# Patient Record
Sex: Female | Born: 1935 | Race: White | Hispanic: No | Smoking: Never smoker
Health system: Southern US, Community
[De-identification: ages and names within clinical notes are randomized; demographics above are authoritative.]

## PROBLEM LIST (undated history)

## (undated) DIAGNOSIS — I2510 Atherosclerotic heart disease of native coronary artery without angina pectoris: Secondary | ICD-10-CM

## (undated) DIAGNOSIS — I38 Endocarditis, valve unspecified: Secondary | ICD-10-CM

## (undated) DIAGNOSIS — K219 Gastro-esophageal reflux disease without esophagitis: Secondary | ICD-10-CM

## (undated) DIAGNOSIS — Z7901 Long term (current) use of anticoagulants: Secondary | ICD-10-CM

## (undated) DIAGNOSIS — M1990 Unspecified osteoarthritis, unspecified site: Secondary | ICD-10-CM

## (undated) DIAGNOSIS — I4891 Unspecified atrial fibrillation: Secondary | ICD-10-CM

## (undated) DIAGNOSIS — I639 Cerebral infarction, unspecified: Secondary | ICD-10-CM

## (undated) DIAGNOSIS — I495 Sick sinus syndrome: Secondary | ICD-10-CM

## (undated) DIAGNOSIS — Z950 Presence of cardiac pacemaker: Secondary | ICD-10-CM

## (undated) DIAGNOSIS — F0390 Unspecified dementia without behavioral disturbance: Secondary | ICD-10-CM

## (undated) DIAGNOSIS — N189 Chronic kidney disease, unspecified: Secondary | ICD-10-CM

## (undated) DIAGNOSIS — K8020 Calculus of gallbladder without cholecystitis without obstruction: Secondary | ICD-10-CM

## (undated) DIAGNOSIS — J111 Influenza due to unidentified influenza virus with other respiratory manifestations: Secondary | ICD-10-CM

## (undated) DIAGNOSIS — D649 Anemia, unspecified: Secondary | ICD-10-CM

## (undated) DIAGNOSIS — I5032 Chronic diastolic (congestive) heart failure: Secondary | ICD-10-CM

## (undated) DIAGNOSIS — I10 Essential (primary) hypertension: Secondary | ICD-10-CM

## (undated) HISTORY — DX: Unspecified atrial fibrillation: I48.91

## (undated) HISTORY — DX: Presence of cardiac pacemaker: Z95.0

## (undated) HISTORY — DX: Sick sinus syndrome: I49.5

## (undated) HISTORY — DX: Unspecified osteoarthritis, unspecified site: M19.90

## (undated) HISTORY — PX: CATARACT EXTRACTION, BILATERAL: SHX1313

## (undated) HISTORY — DX: Essential (primary) hypertension: I10

## (undated) HISTORY — DX: Chronic kidney disease, unspecified: N18.9

## (undated) HISTORY — DX: Calculus of gallbladder without cholecystitis without obstruction: K80.20

## (undated) HISTORY — DX: Atherosclerotic heart disease of native coronary artery without angina pectoris: I25.10

## (undated) HISTORY — DX: Long term (current) use of anticoagulants: Z79.01

## (undated) HISTORY — DX: Anemia, unspecified: D64.9

## (undated) HISTORY — DX: Endocarditis, valve unspecified: I38

## (undated) HISTORY — DX: Cerebral infarction, unspecified: I63.9

## (undated) HISTORY — DX: Unspecified dementia, unspecified severity, without behavioral disturbance, psychotic disturbance, mood disturbance, and anxiety: F03.90

## (undated) HISTORY — PX: ABDOMINAL HYSTERECTOMY: SUR658

## (undated) HISTORY — DX: Chronic diastolic (congestive) heart failure: I50.32

## (undated) HISTORY — PX: HEMORRHOID SURGERY: SHX153

## (undated) HISTORY — PX: CHOLECYSTECTOMY: SHX55

## (undated) HISTORY — PX: TOTAL KNEE ARTHROPLASTY: SHX125

## (undated) HISTORY — PX: APPENDECTOMY: SHX54

---

## 1998-10-16 ENCOUNTER — Other Ambulatory Visit: Admission: RE | Admit: 1998-10-16 | Discharge: 1998-10-16 | Payer: Self-pay | Admitting: *Deleted

## 1999-09-11 ENCOUNTER — Other Ambulatory Visit: Admission: RE | Admit: 1999-09-11 | Discharge: 1999-09-11 | Payer: Self-pay | Admitting: *Deleted

## 2000-10-02 ENCOUNTER — Other Ambulatory Visit: Admission: RE | Admit: 2000-10-02 | Discharge: 2000-10-02 | Payer: Self-pay | Admitting: *Deleted

## 2001-01-01 ENCOUNTER — Encounter (INDEPENDENT_AMBULATORY_CARE_PROVIDER_SITE_OTHER): Payer: Self-pay | Admitting: Specialist

## 2001-01-01 ENCOUNTER — Ambulatory Visit (HOSPITAL_COMMUNITY): Admission: RE | Admit: 2001-01-01 | Discharge: 2001-01-01 | Payer: Self-pay | Admitting: Internal Medicine

## 2001-09-20 ENCOUNTER — Emergency Department (HOSPITAL_COMMUNITY): Admission: EM | Admit: 2001-09-20 | Discharge: 2001-09-20 | Payer: Self-pay | Admitting: Emergency Medicine

## 2004-09-20 ENCOUNTER — Encounter: Admission: RE | Admit: 2004-09-20 | Discharge: 2004-09-20 | Payer: Self-pay | Admitting: Internal Medicine

## 2007-04-15 ENCOUNTER — Encounter: Admission: RE | Admit: 2007-04-15 | Discharge: 2007-04-15 | Payer: Self-pay | Admitting: Internal Medicine

## 2008-06-11 ENCOUNTER — Inpatient Hospital Stay (HOSPITAL_COMMUNITY): Admission: EM | Admit: 2008-06-11 | Discharge: 2008-06-14 | Payer: Self-pay | Admitting: Emergency Medicine

## 2008-06-13 ENCOUNTER — Encounter (INDEPENDENT_AMBULATORY_CARE_PROVIDER_SITE_OTHER): Payer: Self-pay | Admitting: Internal Medicine

## 2008-06-13 ENCOUNTER — Ambulatory Visit: Payer: Self-pay | Admitting: *Deleted

## 2008-11-19 ENCOUNTER — Inpatient Hospital Stay (HOSPITAL_COMMUNITY): Admission: EM | Admit: 2008-11-19 | Discharge: 2008-11-27 | Payer: Self-pay | Admitting: Emergency Medicine

## 2008-11-24 ENCOUNTER — Ambulatory Visit: Payer: Self-pay | Admitting: Internal Medicine

## 2009-01-16 ENCOUNTER — Encounter (INDEPENDENT_AMBULATORY_CARE_PROVIDER_SITE_OTHER): Payer: Self-pay | Admitting: Surgery

## 2009-01-16 ENCOUNTER — Ambulatory Visit (HOSPITAL_COMMUNITY): Admission: RE | Admit: 2009-01-16 | Discharge: 2009-01-17 | Payer: Self-pay | Admitting: Surgery

## 2010-06-13 ENCOUNTER — Ambulatory Visit: Payer: Self-pay | Admitting: Cardiology

## 2010-06-25 ENCOUNTER — Ambulatory Visit: Payer: Self-pay | Admitting: Cardiology

## 2010-07-03 ENCOUNTER — Encounter: Payer: Self-pay | Admitting: Cardiology

## 2010-07-03 ENCOUNTER — Ambulatory Visit: Payer: Self-pay

## 2010-07-03 ENCOUNTER — Ambulatory Visit (HOSPITAL_COMMUNITY)
Admission: RE | Admit: 2010-07-03 | Discharge: 2010-07-03 | Payer: Self-pay | Source: Home / Self Care | Attending: Cardiology | Admitting: Cardiology

## 2010-07-04 ENCOUNTER — Telehealth (INDEPENDENT_AMBULATORY_CARE_PROVIDER_SITE_OTHER): Payer: Self-pay | Admitting: *Deleted

## 2010-07-05 ENCOUNTER — Ambulatory Visit: Payer: Self-pay | Admitting: Cardiology

## 2010-08-07 ENCOUNTER — Ambulatory Visit: Payer: Self-pay | Admitting: Cardiology

## 2010-08-09 NOTE — Progress Notes (Signed)
Summary: Records Request  Faxed Echo to [facility] at [facility] ([phone]). Debby Freiberg  July 04, 2010 11:43 AM

## 2010-10-14 LAB — BASIC METABOLIC PANEL
Calcium: 11 mg/dL — ABNORMAL HIGH (ref 8.4–10.5)
Chloride: 100 mEq/L (ref 96–112)
GFR calc non Af Amer: 27 mL/min — ABNORMAL LOW (ref >60)
Glucose, Bld: 94 mg/dL (ref 70–99)

## 2010-10-14 LAB — COMPREHENSIVE METABOLIC PANEL
ALT: 111 U/L — ABNORMAL HIGH (ref 0–35)
Albumin: 2.8 g/dL — ABNORMAL LOW (ref 3.5–5.2)
BUN: 24 mg/dL — ABNORMAL HIGH (ref 6–23)
CO2: 28 mEq/L (ref 19–32)
Calcium: 8.9 mg/dL (ref 8.4–10.5)
GFR calc non Af Amer: 29 mL/min — ABNORMAL LOW (ref >60)
Glucose, Bld: 123 mg/dL — ABNORMAL HIGH (ref 70–99)
Total Protein: 5.5 g/dL — ABNORMAL LOW (ref 6.0–8.3)

## 2010-10-14 LAB — CBC
MCHC: 33.2 g/dL (ref 30.0–36.0)
MCV: 87.2 fL (ref 78.0–100.0)
RBC: 3.29 MIL/uL — ABNORMAL LOW (ref 3.87–5.11)

## 2010-10-14 LAB — HEMOGLOBIN AND HEMATOCRIT, BLOOD: Hemoglobin: 11.7 g/dL — ABNORMAL LOW (ref 12.0–15.0)

## 2010-10-16 ENCOUNTER — Encounter: Payer: Self-pay | Admitting: Cardiology

## 2010-10-16 ENCOUNTER — Telehealth: Payer: Self-pay | Admitting: Cardiology

## 2010-10-16 DIAGNOSIS — F0390 Unspecified dementia without behavioral disturbance: Secondary | ICD-10-CM

## 2010-10-16 DIAGNOSIS — I639 Cerebral infarction, unspecified: Secondary | ICD-10-CM

## 2010-10-16 DIAGNOSIS — N189 Chronic kidney disease, unspecified: Secondary | ICD-10-CM

## 2010-10-16 DIAGNOSIS — I10 Essential (primary) hypertension: Secondary | ICD-10-CM | POA: Insufficient documentation

## 2010-10-16 DIAGNOSIS — M1990 Unspecified osteoarthritis, unspecified site: Secondary | ICD-10-CM

## 2010-10-16 DIAGNOSIS — K8020 Calculus of gallbladder without cholecystitis without obstruction: Secondary | ICD-10-CM

## 2010-10-16 DIAGNOSIS — I4891 Unspecified atrial fibrillation: Secondary | ICD-10-CM

## 2010-10-16 LAB — CBC
HCT: 30.1 % — ABNORMAL LOW (ref 36.0–46.0)
HCT: 30.3 % — ABNORMAL LOW (ref 36.0–46.0)
HCT: 30.3 % — ABNORMAL LOW (ref 36.0–46.0)
HCT: 31 % — ABNORMAL LOW (ref 36.0–46.0)
HCT: 31 % — ABNORMAL LOW (ref 36.0–46.0)
Hemoglobin: 10.1 g/dL — ABNORMAL LOW (ref 12.0–15.0)
Hemoglobin: 10.1 g/dL — ABNORMAL LOW (ref 12.0–15.0)
Hemoglobin: 10.2 g/dL — ABNORMAL LOW (ref 12.0–15.0)
Hemoglobin: 10.4 g/dL — ABNORMAL LOW (ref 12.0–15.0)
MCHC: 32.7 g/dL (ref 30.0–36.0)
MCHC: 33 g/dL (ref 30.0–36.0)
MCHC: 33.1 g/dL (ref 30.0–36.0)
MCHC: 33.5 g/dL (ref 30.0–36.0)
MCHC: 33.7 g/dL (ref 30.0–36.0)
MCHC: 33.8 g/dL (ref 30.0–36.0)
MCHC: 33.3 g/dL (ref 30.0–36.0)
MCV: 86.6 fL (ref 78.0–100.0)
MCV: 86.7 fL (ref 78.0–100.0)
MCV: 87.1 fL (ref 78.0–100.0)
MCV: 88.7 fL (ref 78.0–100.0)
MCV: 87.1 fL (ref 78.0–100.0)
Platelets: 184 10*3/uL (ref 150–400)
Platelets: 199 10*3/uL (ref 150–400)
Platelets: 213 10*3/uL (ref 150–400)
Platelets: 228 10*3/uL (ref 150–400)
Platelets: 269 10*3/uL (ref 150–400)
Platelets: 321 10*3/uL (ref 150–400)
Platelets: 352 10*3/uL (ref 150–400)
Platelets: 232 10*3/uL (ref 150–400)
RBC: 3.39 MIL/uL — ABNORMAL LOW (ref 3.87–5.11)
RBC: 3.48 MIL/uL — ABNORMAL LOW (ref 3.87–5.11)
RBC: 3.54 MIL/uL — ABNORMAL LOW (ref 3.87–5.11)
RBC: 3.56 MIL/uL — ABNORMAL LOW (ref 3.87–5.11)
RDW: 15.6 % — ABNORMAL HIGH (ref 11.5–15.5)
RDW: 15.8 % — ABNORMAL HIGH (ref 11.5–15.5)
RDW: 15.8 % — ABNORMAL HIGH (ref 11.5–15.5)
RDW: 16 % — ABNORMAL HIGH (ref 11.5–15.5)
WBC: 11.9 10*3/uL — ABNORMAL HIGH (ref 4.0–10.5)
WBC: 9.6 10*3/uL (ref 4.0–10.5)

## 2010-10-16 LAB — COMPREHENSIVE METABOLIC PANEL
ALT: 173 U/L — ABNORMAL HIGH (ref 0–35)
ALT: 84 U/L — ABNORMAL HIGH (ref 0–35)
ALT: 96 U/L — ABNORMAL HIGH (ref 0–35)
ALT: 144 U/L — ABNORMAL HIGH (ref 0–35)
AST: 204 U/L — ABNORMAL HIGH (ref 0–37)
AST: 23 U/L (ref 0–37)
AST: 25 U/L (ref 0–37)
AST: 59 U/L — ABNORMAL HIGH (ref 0–37)
Albumin: 2.4 g/dL — ABNORMAL LOW (ref 3.5–5.2)
Albumin: 2.4 g/dL — ABNORMAL LOW (ref 3.5–5.2)
Albumin: 2.4 g/dL — ABNORMAL LOW (ref 3.5–5.2)
Albumin: 2.4 g/dL — ABNORMAL LOW (ref 3.5–5.2)
Albumin: 2.5 g/dL — ABNORMAL LOW (ref 3.5–5.2)
Albumin: 3.5 g/dL (ref 3.5–5.2)
Alkaline Phosphatase: 135 U/L — ABNORMAL HIGH (ref 39–117)
Alkaline Phosphatase: 189 U/L — ABNORMAL HIGH (ref 39–117)
Alkaline Phosphatase: 192 U/L — ABNORMAL HIGH (ref 39–117)
Alkaline Phosphatase: 224 U/L — ABNORMAL HIGH (ref 39–117)
BUN: 10 mg/dL (ref 6–23)
BUN: 4 mg/dL — ABNORMAL LOW (ref 6–23)
BUN: 7 mg/dL (ref 6–23)
CO2: 20 mEq/L (ref 19–32)
CO2: 25 mEq/L (ref 19–32)
CO2: 27 mEq/L (ref 19–32)
Calcium: 7.7 mg/dL — ABNORMAL LOW (ref 8.4–10.5)
Calcium: 8.6 mg/dL (ref 8.4–10.5)
Calcium: 8.6 mg/dL (ref 8.4–10.5)
Calcium: 9.1 mg/dL (ref 8.4–10.5)
Chloride: 112 mEq/L (ref 96–112)
Chloride: 114 mEq/L — ABNORMAL HIGH (ref 96–112)
Creatinine, Ser: 1.11 mg/dL (ref 0.4–1.2)
Creatinine, Ser: 1.12 mg/dL (ref 0.4–1.2)
Creatinine, Ser: 1.13 mg/dL (ref 0.4–1.2)
GFR calc Af Amer: 55 mL/min — ABNORMAL LOW (ref >60)
GFR calc Af Amer: 56 mL/min — ABNORMAL LOW (ref >60)
GFR calc Af Amer: >60 mL/min (ref >60)
GFR calc Af Amer: 40 mL/min — ABNORMAL LOW (ref >60)
GFR calc non Af Amer: 46 mL/min — ABNORMAL LOW (ref >60)
GFR calc non Af Amer: 53 mL/min — ABNORMAL LOW (ref >60)
Glucose, Bld: 126 mg/dL — ABNORMAL HIGH (ref 70–99)
Glucose, Bld: 89 mg/dL (ref 70–99)
Glucose, Bld: 96 mg/dL (ref 70–99)
Potassium: 3.3 mEq/L — ABNORMAL LOW (ref 3.5–5.1)
Potassium: 3.9 mEq/L (ref 3.5–5.1)
Potassium: 4.2 mEq/L (ref 3.5–5.1)
Sodium: 139 mEq/L (ref 135–145)
Sodium: 141 mEq/L (ref 135–145)
Sodium: 141 mEq/L (ref 135–145)
Total Bilirubin: 1.1 mg/dL (ref 0.3–1.2)
Total Bilirubin: 1.4 mg/dL — ABNORMAL HIGH (ref 0.3–1.2)
Total Bilirubin: 3.1 mg/dL — ABNORMAL HIGH (ref 0.3–1.2)
Total Protein: 5.2 g/dL — ABNORMAL LOW (ref 6.0–8.3)
Total Protein: 5.4 g/dL — ABNORMAL LOW (ref 6.0–8.3)
Total Protein: 5.8 g/dL — ABNORMAL LOW (ref 6.0–8.3)
Total Protein: 6.2 g/dL (ref 6.0–8.3)
Total Protein: 6.9 g/dL (ref 6.0–8.3)

## 2010-10-16 LAB — DIFFERENTIAL
Basophils Relative: 0 % (ref 0–1)
Eosinophils Absolute: 0.1 10*3/uL (ref 0.0–0.7)
Eosinophils Absolute: 0.3 10*3/uL (ref 0.0–0.7)
Eosinophils Relative: 1 % (ref 0–5)
Eosinophils Relative: 5 % (ref 0–5)
Lymphocytes Relative: 16 % (ref 12–46)
Lymphs Abs: 1.7 10*3/uL (ref 0.7–4.0)
Lymphs Abs: 1.5 10*3/uL (ref 0.7–4.0)
Monocytes Absolute: 0.9 10*3/uL (ref 0.1–1.0)
Monocytes Absolute: 0.9 10*3/uL (ref 0.1–1.0)
Monocytes Relative: 10 % (ref 3–12)
Monocytes Relative: 9 % (ref 3–12)
Monocytes Relative: 4 % (ref 3–12)
Neutro Abs: 5.4 10*3/uL (ref 1.7–7.7)
Neutrophils Relative %: 80 % — ABNORMAL HIGH (ref 43–77)

## 2010-10-16 LAB — HEPATIC FUNCTION PANEL
ALT: 31 U/L (ref 0–35)
AST: 22 U/L (ref 0–37)
Bilirubin, Direct: 0.3 mg/dL (ref 0.0–0.3)
Indirect Bilirubin: 0.6 mg/dL (ref 0.3–0.9)
Indirect Bilirubin: 1 mg/dL — ABNORMAL HIGH (ref 0.3–0.9)
Total Bilirubin: 0.9 mg/dL (ref 0.3–1.2)
Total Protein: 5.5 g/dL — ABNORMAL LOW (ref 6.0–8.3)

## 2010-10-16 LAB — BASIC METABOLIC PANEL
CO2: 24 mEq/L (ref 19–32)
CO2: 27 mEq/L (ref 19–32)
Calcium: 8.1 mg/dL — ABNORMAL LOW (ref 8.4–10.5)
Calcium: 8.6 mg/dL (ref 8.4–10.5)
Chloride: 109 mEq/L (ref 96–112)
Creatinine, Ser: 1.42 mg/dL — ABNORMAL HIGH (ref 0.4–1.2)
GFR calc Af Amer: 52 mL/min — ABNORMAL LOW (ref >60)
GFR calc non Af Amer: 35 mL/min — ABNORMAL LOW (ref >60)
Glucose, Bld: 85 mg/dL (ref 70–99)
Glucose, Bld: 95 mg/dL (ref 70–99)
Potassium: 3.7 mEq/L (ref 3.5–5.1)
Potassium: 3.9 mEq/L (ref 3.5–5.1)
Sodium: 143 mEq/L (ref 135–145)

## 2010-10-16 LAB — URINALYSIS, ROUTINE W REFLEX MICROSCOPIC
Nitrite: NEGATIVE
Specific Gravity, Urine: 1.007 (ref 1.005–1.030)
pH: 5.5 (ref 5.0–8.0)

## 2010-10-16 LAB — TROPONIN I
Troponin I: 0.03 ng/mL (ref 0.00–0.06)
Troponin I: 0.01 ng/mL (ref 0.00–0.06)

## 2010-10-16 LAB — CK TOTAL AND CKMB (NOT AT ARMC)
CK, MB: 2.4 ng/mL (ref 0.3–4.0)
Relative Index: INVALID (ref 0.0–2.5)

## 2010-10-16 LAB — BRAIN NATRIURETIC PEPTIDE: Pro B Natriuretic peptide (BNP): 1100 pg/mL — ABNORMAL HIGH (ref 0.0–100.0)

## 2010-10-16 NOTE — Telephone Encounter (Signed)
[facility]PT ASAP, PT HAS FATIQUE AND DYSPHNEA PER THEM NOT NORMAL FOR THIS PT. OFFERED WHAT WAS OPEN BUT NOT SOON ENOUGH.

## 2010-10-17 ENCOUNTER — Encounter: Payer: Self-pay | Admitting: Nurse Practitioner

## 2010-10-17 ENCOUNTER — Encounter: Payer: Self-pay | Admitting: Cardiology

## 2010-10-18 ENCOUNTER — Ambulatory Visit
Admission: RE | Admit: 2010-10-18 | Discharge: 2010-10-18 | Disposition: A | Discharge status: Home / Self Care | Payer: Medicare Other | Source: Ambulatory Visit | Attending: Nurse Practitioner | Admitting: Nurse Practitioner

## 2010-10-18 ENCOUNTER — Ambulatory Visit (INDEPENDENT_AMBULATORY_CARE_PROVIDER_SITE_OTHER): Payer: Medicare Other | Admitting: Nurse Practitioner

## 2010-10-18 ENCOUNTER — Encounter: Payer: Self-pay | Admitting: Nurse Practitioner

## 2010-10-18 DIAGNOSIS — R5383 Other fatigue: Secondary | ICD-10-CM

## 2010-10-18 DIAGNOSIS — R69 Illness, unspecified: Secondary | ICD-10-CM

## 2010-10-18 DIAGNOSIS — R0600 Dyspnea, unspecified: Secondary | ICD-10-CM

## 2010-10-18 DIAGNOSIS — I10 Essential (primary) hypertension: Secondary | ICD-10-CM

## 2010-10-18 DIAGNOSIS — R0609 Other forms of dyspnea: Secondary | ICD-10-CM

## 2010-10-18 DIAGNOSIS — R5381 Other malaise: Secondary | ICD-10-CM

## 2010-10-18 DIAGNOSIS — R6889 Other general symptoms and signs: Secondary | ICD-10-CM | POA: Insufficient documentation

## 2010-10-18 DIAGNOSIS — I4891 Unspecified atrial fibrillation: Secondary | ICD-10-CM

## 2010-10-18 LAB — CBC WITH DIFFERENTIAL/PLATELET
Basophils Absolute: 0 10*3/uL (ref 0.0–0.1)
Basophils Relative: 0.2 % (ref 0.0–3.0)
Eosinophils Absolute: 0 10*3/uL (ref 0.0–0.7)
Eosinophils Relative: 0.4 % (ref 0.0–5.0)
HCT: 36.1 % (ref 36.0–46.0)
Hemoglobin: 12.1 g/dL (ref 12.0–15.0)
Lymphocytes Relative: 17.8 % (ref 12.0–46.0)
Lymphs Abs: 1.6 10*3/uL (ref 0.7–4.0)
MCHC: 33.5 g/dL (ref 30.0–36.0)
MCV: 86.1 fl (ref 78.0–100.0)
Monocytes Absolute: 0.7 10*3/uL (ref 0.1–1.0)
Monocytes Relative: 8.3 % (ref 3.0–12.0)
Neutro Abs: 6.4 10*3/uL (ref 1.4–7.7)
Neutrophils Relative %: 73.3 % (ref 43.0–77.0)
Platelets: 249 10*3/uL (ref 150.0–400.0)
RBC: 4.19 Mil/uL (ref 3.87–5.11)
RDW: 15.7 % — ABNORMAL HIGH (ref 11.5–14.6)
WBC: 8.7 10*3/uL (ref 4.5–10.5)

## 2010-10-18 LAB — BASIC METABOLIC PANEL
BUN: 26 mg/dL — ABNORMAL HIGH (ref 6–23)
CO2: 31 mEq/L (ref 19–32)
Calcium: 9.4 mg/dL (ref 8.4–10.5)
Chloride: 98 mEq/L (ref 96–112)
Creatinine, Ser: 1.4 mg/dL — ABNORMAL HIGH (ref 0.4–1.2)
GFR: 38.72 mL/min — ABNORMAL LOW (ref >60.00)
Glucose, Bld: 87 mg/dL (ref 70–99)
Potassium: 3.9 mEq/L (ref 3.5–5.1)
Sodium: 138 mEq/L (ref 135–145)

## 2010-10-18 LAB — BRAIN NATRIURETIC PEPTIDE: Pro B Natriuretic peptide (BNP): 376.1 pg/mL — ABNORMAL HIGH (ref 0.0–100.0)

## 2010-10-18 LAB — TSH: TSH: 0.9 u[IU]/mL (ref 0.35–5.50)

## 2010-10-18 NOTE — Assessment & Plan Note (Signed)
Her blood pressure is good. We will keep her on her current medicines.

## 2010-10-18 NOTE — Assessment & Plan Note (Signed)
Her rate is controlled. She is not lighted today. We will continue to monitor. Could consider a monitor but for now will follow clinically.

## 2010-10-18 NOTE — Progress Notes (Signed)
Susan [name] Date of Birth: 05-07-1921   History of Present Illness: Susan [name] is seen today for a work in visit. She is seen for Dr. Swaziland. She has multiple complaints. She has basically not felt well for the past 2 weeks. She has gotten more fatigued and short of breath. She has not been able to even walk to her mailbox. She has had a few spells of being lightheaded. She denies syncope. She is not having chest pain. She has had more of a cough and feels like she cannot produce the sputum. She feels like she has had some "flushed" spells but no actual fever was noted. She has had some chills. She feels like her fatigue is more out of proportion. Her blood pressure, heart rates and weight chart is reviewed and is satisfactory. She has chronic atrial fib but her rate has been fairly well controlled.   Current Outpatient Prescriptions on File Prior to Visit  Medication Sig Dispense Refill  . aspirin 81 MG tablet Take 81 mg by mouth daily.        . Calcium Carbonate (CALTRATE 600 PO) Take by mouth 2 (two) times daily.       . Cholecalciferol (VITAMIN D-3 PO) Take 2,000 mg by mouth 2 (two) times daily.        . Chromium Picolinate 200 MCG CAPS Take by mouth.        . digoxin (LANOXIN) 0.125 MG tablet Take 125 mcg by mouth daily.        Marland Kitchen diltiazem (CARDIZEM) 120 MG tablet Take 120 mg by mouth daily.        Marland Kitchen donepezil (ARICEPT) 10 MG tablet Take 10 mg by mouth at bedtime.        Marland Kitchen estrogens, conjugated, (PREMARIN) 0.625 MG tablet Take 0.625 mg by mouth daily. Take daily for 21 days then do not take for 7 days.       . fish oil-omega-3 fatty acids 1000 MG capsule Take 2 g by mouth daily.        . fluticasone (FLONASE) 50 MCG/ACT nasal spray 2 sprays by Nasal route 2 (two) times daily as needed.        . Fluticasone-Salmeterol (ADVAIR DISKUS) 250-50 MCG/DOSE AEPB Inhale 1 puff into the lungs every 12 (twelve) hours.        . furosemide (LASIX) 40 MG tablet Take 40 mg by mouth 2 (two) times daily.          . Garlic 100 MG TABS Take by mouth.        . Glucosamine & Fish Oil 500-400-60-40 MG CAPS Take by mouth 2 (two) times daily.        Marland Kitchen HYDROcodone-acetaminophen (NORCO) 10-325 MG per tablet Take 1 tablet by mouth every 6 (six) hours as needed.        . metoprolol (TOPROL-XL) 50 MG 24 hr tablet Take 50 mg by mouth daily.        . montelukast (SINGULAIR) 10 MG tablet Take 10 mg by mouth at bedtime.        . Multiple Vitamins-Minerals (OCUVITE ADULT 50+ PO) Take by mouth daily.        . multivitamin (THERAGRAN) per tablet Take 1 tablet by mouth daily.        Marland Kitchen warfarin (COUMADIN) 5 MG tablet Take 5 mg by mouth as directed.        Marland Kitchen DISCONTD: Nebivolol HCl (BYSTOLIC) 20 MG TABS Take by mouth as needed.  Allergies  Allergen Reactions  . Prednisone     Long term has reaction; felt like passing out  . Sulfa Antibiotics Nausea And Vomiting  . Amoxicillin Rash    Past Medical History  Diagnosis Date  . Hypertension   . Asthma   . Dementia   . Osteoarthritis   . Gallstones   . Chronic kidney disease     Stage 2 to 3  . Anemia   . Stroke     Remote R cerebellar stroke with gait abnormaility  . Atrial fibrillation   . Coronary artery disease   . Anticoagulant long-term use   . Diastolic dysfunction   . Valvular heart disease     Mitral and tricuspid insufficiency    Past Surgical History  Procedure Date  . Abdominal hysterectomy     total  . Appendectomy   . Hemorrhoid surgery   . Cataract extraction, bilateral   . Total knee arthroplasty   . Cholecystectomy     History  Smoking status  . Never Smoker   Smokeless tobacco  . Not on file    History  Alcohol Use: Not on file    Family History  Problem Relation Age of Onset  . Clotting disorder Mother   . Suicidality Father     Review of Systems: The review of systems is as above.   All other systems were reviewed and are negative.  Physical Exam: BP 128/78  Pulse 72  Ht 5\' 7"  (1.702 m)  Wt 153 lb  12.8 oz (69.763 kg)  BMI 24.09 kg/m2 Patient is very pleasant and in no acute distress. Skin is warm and dry. Color is normal.  HEENT is unremarkable. Normocephalic/atraumatic. PERRL. Sclera are nonicteric. Neck is supple. No masses. No JVD. Lungs are clear. Cardiac exam shows an irregular rhythm. Her apical rate is 80.  Abdomen is soft. Extremities are without edema. Gait and ROM are intact. No gross neurologic deficits noted. Oxygen sat was 98% at rest and 95% with ambulation here in the office.   LABORATORY DATA:  PENDING  Assessment / Plan:

## 2010-10-18 NOTE — Assessment & Plan Note (Addendum)
I'm not sure what to make of all of her symptoms. She may have picked up a bug/virus. We will check a CXR and labs today. She may use some Mucinex over the counter.  I don't think she needs further testing at this time. Further disposition to follow once her labs and CXR are reviewed. She was to see Dr. Swaziland later this month for her regular follow up. We will arrange for that appointment today.

## 2010-10-18 NOTE — Patient Instructions (Signed)
We are going to check a CXR today and some lab work today.  Keep your next appointment. Stay on your current medicines. You can take some mucinex to help loosen your cough.

## 2010-10-19 ENCOUNTER — Telehealth: Payer: Self-pay | Admitting: *Deleted

## 2010-10-19 LAB — DIGOXIN LEVEL: Digoxin Level: 1.2 ng/mL (ref 0.8–2.0)

## 2010-10-19 NOTE — Telephone Encounter (Signed)
LM to call to get lab and chest xray results.

## 2010-10-19 NOTE — Telephone Encounter (Signed)
Message copied by Murrell Redden on Fri Oct 19, 2010  2:04 PM ------      Message from: Norma Fredrickson      Created: Thu Oct 18, 2010 11:13 AM       CXR looks ok. Would try the mucinex OTC as we talked about. I will have the labs back tomorrow.

## 2010-10-22 ENCOUNTER — Telehealth: Payer: Self-pay | Admitting: *Deleted

## 2010-10-22 NOTE — Telephone Encounter (Signed)
Notified of lab results and chest x-ray. Advised to call us back if has any further symptoms.

## 2010-11-06 DIAGNOSIS — Z950 Presence of cardiac pacemaker: Secondary | ICD-10-CM

## 2010-11-06 HISTORY — DX: Presence of cardiac pacemaker: Z95.0

## 2010-11-06 HISTORY — PX: PACEMAKER INSERTION: SHX728

## 2010-11-12 ENCOUNTER — Encounter: Payer: Self-pay | Admitting: Cardiology

## 2010-11-12 ENCOUNTER — Ambulatory Visit (INDEPENDENT_AMBULATORY_CARE_PROVIDER_SITE_OTHER): Payer: Medicare Other | Admitting: Cardiology

## 2010-11-12 VITALS — BP 118/70 | HR 61 | Ht 66.0 in | Wt 151.6 lb

## 2010-11-12 DIAGNOSIS — I509 Heart failure, unspecified: Secondary | ICD-10-CM

## 2010-11-12 DIAGNOSIS — I10 Essential (primary) hypertension: Secondary | ICD-10-CM

## 2010-11-12 DIAGNOSIS — I639 Cerebral infarction, unspecified: Secondary | ICD-10-CM

## 2010-11-12 DIAGNOSIS — I6350 Cerebral infarction due to unspecified occlusion or stenosis of unspecified cerebral artery: Secondary | ICD-10-CM

## 2010-11-12 DIAGNOSIS — I4891 Unspecified atrial fibrillation: Secondary | ICD-10-CM

## 2010-11-12 DIAGNOSIS — I5032 Chronic diastolic (congestive) heart failure: Secondary | ICD-10-CM

## 2010-11-12 NOTE — Patient Instructions (Signed)
Continue current medications.  We will have you wear a monitor for 24 hours to make sure your heart rate is where it should be.   Otherwise I will see you back in 2 months.

## 2010-11-13 DIAGNOSIS — I5032 Chronic diastolic (congestive) heart failure: Secondary | ICD-10-CM | POA: Insufficient documentation

## 2010-11-13 NOTE — Assessment & Plan Note (Signed)
It concerns me that she still has significant symptoms of fatigue and some shortness of breath. Based on her evaluation there does not appear to be any significant congestive heart failure at this point. I worry that her heart rate may be too slow at times. I think we need to make sure that she is not experiencing episodes of significant bradycardia. We will have her wear a 24-hour Holter monitor to judge her heart rate response.

## 2010-11-13 NOTE — Assessment & Plan Note (Signed)
She appears to be well compensated at this point. We'll continue with her current medical therapy. I stressed the importance of sodium restriction.

## 2010-11-13 NOTE — Progress Notes (Signed)
Susan [name] Date of Birth: [birth year]   History of Present Illness: Susan [name] is seen today for followup today. She still complains of symptoms of weakness. She gets fatigued easily. She does have some shortness of breath virtually every day. The symptoms do come and go but do occur frequently. She has occasional lightheadedness. She denies any chest pain. She denies any syncope.  Current Outpatient Prescriptions on File Prior to Visit  Medication Sig Dispense Refill  . aspirin 81 MG tablet Take 81 mg by mouth daily.        . Calcium Carbonate (CALTRATE 600 PO) Take by mouth 2 (two) times daily.       . Cholecalciferol (VITAMIN D-3 PO) Take 2,000 mg by mouth 2 (two) times daily.        . Chromium Picolinate 200 MCG CAPS Take by mouth.        . digoxin (LANOXIN) 0.125 MG tablet Take 125 mcg by mouth daily.        Marland Kitchen diltiazem (CARDIZEM) 120 MG tablet Take 120 mg by mouth daily.        Marland Kitchen donepezil (ARICEPT) 10 MG tablet Take 10 mg by mouth at bedtime.        Marland Kitchen estrogens, conjugated, (PREMARIN) 0.625 MG tablet Take 0.625 mg by mouth daily. Take daily for 21 days then do not take for 7 days.       . fish oil-omega-3 fatty acids 1000 MG capsule Take 2 g by mouth daily.        . fluticasone (FLONASE) 50 MCG/ACT nasal spray 2 sprays by Nasal route 2 (two) times daily as needed.        . Fluticasone-Salmeterol (ADVAIR DISKUS) 250-50 MCG/DOSE AEPB Inhale 1 puff into the lungs every 12 (twelve) hours.        . furosemide (LASIX) 40 MG tablet Take 20 mg by mouth 2 (two) times daily.       . Garlic 100 MG TABS Take by mouth.        . Glucosamine & Fish Oil 500-400-60-40 MG CAPS Take by mouth 2 (two) times daily.        Marland Kitchen HYDROcodone-acetaminophen (NORCO) 10-325 MG per tablet Take 1 tablet by mouth every 6 (six) hours as needed.        . metoprolol (TOPROL-XL) 50 MG 24 hr tablet Take 50 mg by mouth daily.        . montelukast (SINGULAIR) 10 MG tablet Take 10 mg by mouth at bedtime.        . Multiple  Vitamins-Minerals (OCUVITE ADULT 50+ PO) Take by mouth daily.        . multivitamin (THERAGRAN) per tablet Take 1 tablet by mouth daily.        Marland Kitchen warfarin (COUMADIN) 5 MG tablet Take 5 mg by mouth as directed.          Allergies  Allergen Reactions  . Prednisone     Long term has reaction; felt like passing out  . Sulfa Antibiotics Nausea And Vomiting  . Amoxicillin Rash    Past Medical History  Diagnosis Date  . Hypertension   . Asthma   . Dementia   . Osteoarthritis   . Gallstones   . Chronic kidney disease     Stage 2 to 3  . Anemia   . Stroke     Remote R cerebellar stroke with gait abnormaility  . Atrial fibrillation   . Coronary artery disease   . Anticoagulant long-term  use   . Diastolic dysfunction   . Valvular heart disease     Mitral and tricuspid insufficiency    Past Surgical History  Procedure Date  . Abdominal hysterectomy     total  . Appendectomy   . Hemorrhoid surgery   . Cataract extraction, bilateral   . Total knee arthroplasty   . Cholecystectomy     History  Smoking status  . Never Smoker   Smokeless tobacco  . Not on file    History  Alcohol Use: Not on file    Family History  Problem Relation Age of Onset  . Clotting disorder Mother   . Suicidality Father     Review of Systems: The review of systems is positive for recent tear duct surgery. Otherwise as noted above.   All other systems were reviewed and are negative.  Physical Exam: BP 118/70  Pulse 61  Ht 5\' 6"  (1.676 m)  Wt 151 lb 9.6 oz (68.765 kg)  BMI 24.47 kg/m2 Patient is very pleasant and in no acute distress. Skin is warm and dry. Color is normal.  HEENT is unremarkable. Normocephalic/atraumatic. PERRL. Sclera are nonicteric. Neck is supple. No masses. No JVD. Lungs are clear. Cardiac exam shows an irregular rhythm. Her apical rate is 80.  Abdomen is soft. Extremities are without edema. Gait and ROM are intact. No gross neurologic deficits noted.    LABORATORY  DATA:   From her prior visit her CBC and TSH were normal. Her BUN was 26 with creatinine 1.4. BNP level was 376 which is the lowest that we have seen. Chest x-ray showed no active disease. ECG today demonstrates atrial fibrillation with a pulse of 61. She has rightward axis and nonspecific ST abnormality. Assessment / Plan:

## 2010-11-13 NOTE — Assessment & Plan Note (Signed)
Given her age, hypertension, and prior stroke she will need to remain on Coumadin indefinitely.

## 2010-11-13 NOTE — Assessment & Plan Note (Signed)
Blood pressure is well controlled on current medications. 

## 2010-11-15 ENCOUNTER — Telehealth: Payer: Self-pay | Admitting: *Deleted

## 2010-11-15 NOTE — Telephone Encounter (Signed)
Notified of 24 hr monitor results. Per Dr. Swaziland advised to stop digoxin and cardizem and to set app up w/EP. Made an app w/ Dr. Johney Frame 5/23 at 11:30. Will sent monitor results to them.

## 2010-11-20 NOTE — H&P (Signed)
Susan [name], Susan [name]                ACCOUNT NO.:  [id number]   MEDICAL RECORD NO.:  [id number]          PATIENT TYPE:  INP   LOCATION:  1301                         FACILITY:  [facility]   PHYSICIAN:  Barry Dienes. Eloise Harman, M.D.DATE OF BIRTH:  May 08, 1921   DATE OF ADMISSION:  11/19/2008  DATE OF DISCHARGE:                              HISTORY & PHYSICAL   CHIEF COMPLAINT:  Crampy abdominal pain, nausea, and vomiting.   HISTORY OF PRESENT ILLNESS:  The patient is an 77 year old white woman  who was in her usual state of fairly good health until after breakfast  today.  She had a  breakfast with sausage and eggs after which she  developed progressive epigastric pain throughout the day with burping,  nausea, vomiting times one, and fever.  She presented to the [facility]  Emergency Room for evaluation.  She was given treatment for nausea and  IV fluids and started on IV antibiotics for cholecystitis.  She feels  better than at the time of her presentation, but still has very mild  nausea.  She denies shortness of breath or substernal chest pain or  dysuria or frequency.  Her bowel movements have been normal lately.   PAST MEDICAL HISTORY:  1. Asymptomatic multiple gallstones seen in 2009.  2. Congestive heart failure associated with moderate mitral      regurgitation.  3. Diastolic dysfunction, and left ventricular hypertrophy with a left      ventricular ejection fraction of 60%.  4. Hypertension medication induced.  5. Bradycardia and presyncope.  6. Chronic anemia.  7. Mild Alzheimer's disease.  8. Remote right cerebellar stroke with gait instability.  9. Chronic kidney disease with glomerular infiltration rate of 40-45.  10.Asthma.  11.Gastroesophageal reflux disease.  12.Osteoarthritis.   CURRENT MEDICATIONS:  1. Altace 10 mg p.o. daily.  2. Aricept 10 mg p.o. daily.  3. Singulair 10 mg daily.  4. Fosamax 35 mg once weekly.  5. Premarin 0.625 mg once daily.  6. Calcium with  vitamin D once daily.  7. Protonix 20 mg twice daily.  8. Advair discus 1 inhalation twice daily.  9. Bystolic 5 mg once daily.  10.Lasix 20 mg once daily.  11.Ecotrin 325 mg once daily.   ALLERGIES:  SULFA drugs.   PAST SURGICAL HISTORY:  1. Tonsillectomy and adenoidectomy.  2. Total abdominal hysterectomy and appendectomy with ovaries left      intact.  3. Hemorrhoidectomy.  4. Cataract operations bilaterally.  5. Remote left total knee replacement.   FAMILY HISTORY:  Father died at age 72 with suicide.  Mother died at age  2.  A sister died at age 58 of coronary artery disease and lymphoma.   SOCIAL HISTORY:  She has been widowed for over 14 years.  She lives with  her daughter, Carney Living, who works in our office.  She also has one son.  She  has no history of tobacco or alcohol abuse.   REVIEW OF SYSTEMS:  Notable for fever in the emergency room and diffuse  pain from osteoarthritis.  She does not, however, need to use a  cane or  walker to ambulate and has not had any falls recently.  She denies  shortness of breath, substernal chest pain, cough, anxiety, or  depression.   INITIAL PHYSICAL EXAM:  VITAL SIGNS:  Blood pressure 129/62, pulse 70,  respirations 20, temperature 100.7 degrees Fahrenheit, pulse oxygen  saturation 94% on room air.  GENERAL:  She is a well-nourished, well-developed elderly white woman  who is in no apparent distress while sitting partially upright in bed.  HEAD, EYES, EARS, NOSE AND THROAT: Exam was within normal limits and she  lacked scleral icterus.  NECK:  Supple without jugular venous distention or carotid bruit.  CHEST: Was clear to auscultation.  HEART:  Had a regular rate and rhythm and there was a systolic ejection  murmur of grade 2/6 at the left sternal border that radiated to the  axilla.  There is no gallop.  ABDOMEN:  Had normal bowel sounds and very mild right upper quadrant  tenderness without rebound.  There is no  hepatosplenomegaly.  EXTREMITIES:  Had bilateral trace ankle edema and normal pedal pulses.  NEUROLOGICAL EXAM:  She is alert and well-oriented and could give a good  history.  She could move all extremities well.   INITIAL LABORATORY STUDIES:  White blood cell count 11.6, hemoglobin  11.4, hematocrit 34, platelets 232 with 80% neutrophils.  Serum sodium  141, potassium 3.6, chloride 106, carbon dioxide 25, glucose 96, BUN 27,  creatinine 1.49, total protein 6.9, albumin 3.5, SGOT 172, troponin-I  0.01, lipase 86, total CPK 85, BNP 805.  Urinalysis:  Specific gravity  1.007 and nitrite negative.   Abdomen ultrasound exam showed multiple stones within the gallbladder  lumen, gallbladder wall thickened measuring 1 cm, positive sonographic  Murphy's sign, common bile duct mildly increased in caliber measuring  6.9 mm, simple cyst in the right hepatic lobe, normal spleen, right  kidney measures 10.1 cm with slightly increased cortical echogenicity.  ECG reportedly showed no acute changes.   IMPRESSION AND PLAN:  1. Right upper quadrant pain and epigastric pain:  Most likely      secondary to cholecystitis.  It is likely she has chronic      cholecystitis and cystitis with acute exacerbation due to cystic      duct obstruction by a small stone.  Given the subacute nature, it      would be optimal to cool down her gallbladder prior to      cholecystectomy, and discuss with Dr. Wenda Low.  Accordingly,      she will be admitted and given IV fluids and initial IV      antibiotics.  Alternatively, if her symptoms do not improve, we      will consider percutaneous cholecystotomy.  Eventually, she will      need a cholecystectomy.  She is clinically improved following      treatment with Zofran and initiation of IV antibiotic treatment.  2. History of congestive heart failure:  Clinically stable.  Her      elevated BNP test could be partially due to stretch associated with      mitral  regurgitation.  Her baseline BNP runs from 159 to 473 during      December 2009 hospitalization.  We will closely monitor her fluid      status and BNP level during her hospitalization.  3. History of chronic kidney disease:  Her baseline serum creatinine      level is 1.21 and her current level  is a bit higher, possibly due      to decreased food and fluid intake.  Given her likely low      intravascular fluid volume, we will hold her ACE inhibitor      treatment for now.  We will follow her basic metabolic panel      closely while she is an inpatient.  4. Asthma:  Clinically stable on current medications.           ______________________________  Barry Dienes Eloise Harman, M.D.     DGP/MEDQ  D:  11/19/2008  T:  11/20/2008  Job:  [id number]   cc:   Larina Earthly, M.D.  Fax: [phone]   Colleen Can. Deborah Chalk, M.D.  Fax: [phone]   Hedwig Morton. Juanda Chance, MD  [address]  [place]  [place] [address]

## 2010-11-20 NOTE — H&P (Signed)
NAMESINDIA, KOWALCZYK                ACCOUNT NO.:  [id number]   MEDICAL RECORD NO.:  [id number]          PATIENT TYPE:  INP   LOCATION:  1426                         FACILITY:  [facility]   PHYSICIAN:  Mark A. Perini, M.D.   DATE OF BIRTH:  1920-11-28   DATE OF ADMISSION:  06/10/2008  DATE OF DISCHARGE:                              HISTORY & PHYSICAL   CHIEF COMPLAINT:  Presyncope, nausea and vomiting x1, dizziness, edema  and shortness of breath.   HISTORY OF PRESENT ILLNESS:  Susan [name] is a very pleasant 77 year old female  with a past medical history as listed below.  She has had some slightly  increased dyspnea on exertion compared to her chronic dyspnea on  exertion in the last 2-4 weeks.  She has had some increased pedal and  ankle edema in the last few weeks.  Also, she has had 3 steroid shots, 1  in her hip and 1 in each shoulder in the last month or so.  Tonight,  after she ate dinner with her family, she felt nauseated and  presyncopal.  In the car, she actually threw up one time.  She was  brought to the emergency room.  Due to the findings in the emergency  room, she will be admitted for further care.   PAST HISTORY:  1. Osteoarthritis, generalized.  2. Asthma and chronic bronchitis.  3. Hypertension.  4. Colon polyp and diverticulosis by 2002 colonoscopy.  5. Postmenopausal hot flashes.  6. Dementia on medicine for approximately the last 12 months.  7. No definite history of MI or stroke per the patient.  8. Gastroesophageal reflux disease.  9. History of tonsillectomy.  10.History of hysterectomy without oophorectomy.  11.History of hemorrhoid surgery.  12.History of [name] total knee arthroplasty about 15 years ago.  13.She had an echocardiogram 2 years ago, but we do not have the      results of this.  14.She has also had an appendectomy.   ALLERGIES:  PREDNISONE BUT SHE TOLERATES THE CORTISONE SHOTS OKAY.   MEDICATIONS:  1. Altace 10 mg daily.  2. Aricept 10 mg  daily.  3. Singular 10 mg daily.  4. Fosamax 70 mg weekly.  5. Premarin 0.625 mg daily.  6. Calcium daily.  7. Vitamin D daily.  8. Protonix 40 mg daily.  9. Advair 250/50 one puff twice a day.  10.Aspirin 81 mg daily.   SOCIAL HISTORY:  She has been widowed for 14 years.  She has 2 children,  1 son and 1 daughter.  Her daughter, Susan [name], works at our office and is  at the bedside.  She does not smoke or drink alcohol or use drugs.  She  has 2 grandchildren and 4 great-grandchildren.   FAMILY HISTORY:  Father died of suicide at age 55.  Mother died at age  54.  One sister died at age 64 of lymphoma and heart disease.   REVIEW OF SYSTEMS:  She has been dizzy, and she has been falling to her  [name] side.  This has been noticed more over the last 3 weeks.  The  first  week of November, she did have a fall.  She bent over and simply lost  her balance.  When she gets up at night, sometimes she falls back onto  the bed and falls a little bit to her [name], and this has been going on  for 1 month.  She does not report any focal neurologic weakness.  She  denies any blindness or visual problems.  She denies any speech problems  or swallowing problems.  She denies any abdominal pain, but she has felt  nauseated in the mornings for the last several days or weeks.  She  denies any current chest pains.  She denies any blood from above or  below.  She has had no fevers.  She did have some diarrhea with loose  stools for a few weeks felt possibly to be related to the steroid shots.  She has not taking any recent antibiotics.  She had 2 weeks of possibly  some increased lower extremity edema.   PHYSICAL EXAMINATION:  Saturation 96% on room air, blood pressure was  190/71 and now is 160/40.  Pulse is ranging from 49-60 and is sinus  rhythm.  Respiratory rate is 28.  She is lying semi supine.  She is  alert and oriented x4.  No acute distress.  Normocephalic, atraumatic.  She does have JVD noted to the  angle of the  jaw.  Cranial nerves II-XII are normal.  There is no pronator drift.  Normal  finger-nose-finger testing is noted.  HEART:  Is regular rate and rhythm with a 2/6 systolic ejection murmur  heard at [name] and right sternal border, and this does radiate to the  apex.  She does have some inspiratory rales bilaterally heard throughout both  lung fields, but these are mild, and she has no wheezes or rhonchi.  ABDOMEN:  Soft, nontender, nondistended with no masses or  hepatosplenomegaly.  She has very trace [name] ankle edema.  She has good  pulses felt peripherally.   LABORATORY DATA:  Cranial CT scan shows no acute findings, but it does  show a [name] basal ganglia lacunar infarct.  She does have atrophy.  Chest x-ray reveals cardiac enlargement, chronic bronchitic changes and  some atelectasis scarring but no definite edema or effusions.  EKG shows  bradycardia with a heart rate of 49 beats a minute but is otherwise  essentially normal.  She does have sinus arrhythmia.  White count is 9.2  with a normal differential.  Hemoglobin 11.6, platelet count 279,000.  BMP is 468, myoglobin 74.9, MB 1.4, troponin I less than 0.205, and  these were negative x2 sets.  Sodium is 138, potassium 3.7, chloride  106, CO2 24, BUN 26, creatinine 1.69, glucose 127, calcium 8.8, albumin  3.3, total protein 6.2, AST 28.  Lipase is slightly high at 66.  Urinalysis has a specific gravity 1.029.  There is trace ketones but is  otherwise negative on the dipstick.  Microscopic urine exam shows 0-2  white cells, 0-2 red cells and many bacteria.   ASSESSMENT/PLAN:  An 77 year old female with several issues.  She has  been dizzy and falling to the [name] for approximately 1 month.  I wonder  if she has had a subacute stroke.  We will check orthostatic blood  pressure and pulses, will check an MRI and MRA of the brain, and we will  check carotid Dopplers.  She also has some new congestive heart failure   symptoms by history and exam  and a somewhat elevated BNP.  We will dose  her with Lasix.  We will obtain a cardiology consult.  We will check an  echocardiogram as well.  She does have an elevated lipase, but she has  no clinical signs of pancreatitis.  We will recheck an amylase and  lipase level.  For hypertension, we will follow this and start a  diuretic to lower blood pressure.  She is a full code status.  She also  had some  presyncope, and she is slightly bradycardic.  We will monitor this on  telemetry, and she may need to have underlying atherosclerotic coronary  disease ruled out.  We will also check a cortisol level given her recent  steroid shots.      Mark A. Perini, M.D.  Electronically Signed     MAP/MEDQ  D:  06/11/2008  T:  06/11/2008  Job:  [id number]   cc:   Larina Earthly, M.D.  Fax: [phone]   Almedia Balls. Ranell Patrick, M.D.  Fax: [phone]

## 2010-11-20 NOTE — Discharge Summary (Signed)
[name], [name]                ACCOUNT NO.:  [id number]   MEDICAL RECORD NO.:  [id number]          PATIENT TYPE:  INP   LOCATION:  1426                         FACILITY:  [facility]   PHYSICIAN:  Susan [name], M.D.        DATE OF BIRTH:  Mar 19, 75 1922   DATE OF ADMISSION:  06/10/2008  DATE OF DISCHARGE:  06/14/2008                               DISCHARGE SUMMARY   DISCHARGE DIAGNOSES:  1. Congestive heart failure with volume overload associated with      moderate mitral regurgitation, diastolic dysfunction, and mild left      ventricular hypertrophy with an ejection fraction of 60%.  2. Hypertension.  3. Bradycardia.  4. Presyncope with no recurrence  5. Cholelithiasis, asymptomatic.  6. Anemia.  7. Mild dementia.  8. 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  9. Chronic kidney disease with a GFR 40-45.  10.Asthma.  11.Gastroesophageal reflux disease.  12.Severe degenerative joint disease.   DISCHARGE MEDICATIONS:  1. Altace 10 mg daily.  2. Aricept 10 mg daily.  3. Singulair 10 mg daily.  4. Fosamax 70 mg weekly.  5. Calcium b.i.d.  6. Vitamin D daily.  7. Protonix 40 mg daily.  8. Advair 250/50 one puff twice daily.  9. Aspirin 325 mg daily.  10.The patient will discontinue Mobic and Premarin and will start      Lasix 20 mg each morning.  11.Voltaren gel 1% 2 grams application to bilateral shoulders four      times daily.   DISCHARGE LABS:  White blood cell count 8.2, hemoglobin 11.7, hematocrit  35.8%, platelet count 276,000, sodium 142, potassium 4.3, BUN 18,  creatinine 1.21, glucose 99, serum CO2 28, AST 24, ALT 22, alkaline  phosphatase 42, total bilirubin 0.8, albumin 2.9, calcium 9.2.  Other  pertinent labs, amylase 84 and lipase 49 on June 11, 2008.  Cardiac  enzymes x3 negative.  Magnesium 2.3, phosphorus 4.6, TSH 1.459.  Vitamin  B12 411.  A.m. cortisol was normal at 15.2.  Urine culture growing out  multiple species.  BNP on admission was 473,  decreasing to 159 prior to  discharge.   PROCEDURES:  1. 2-D echocardiogram on June 13, 2008, revealed an ejection      fraction 60%, mild LVH, moderate MR, calcified mitral valve,      moderate left atrial enlargement, diastolic dysfunction, mild TR,      and mild pulmonary hypertension.  2. Carotid Duplex ultrasound preliminary results revealed no ICA      stenosis and antegrade flow in the vertebral arteries bilaterally.  3. Abdominal ultrasound on June 12, 2008, revealed cholelithiasis      without gallbladder wall thickening or pericholecystic fluid, right      hepatic cyst, and small right pleural effusion.  4. Brain MRI without contrast reveals atrophy and chronic      microvascular ischemia, no acute abnormality.  5. MRA of the head is negative.  However, there are small chronic      cerebellar infarcts noted on the right.  The brain stem has a  normal signal.  6. CT of the head without contrast on June 10, 2008, reveals no      acute intracranial abnormality, chronic small vessel white matter      disease, and the nares of acute ischemia.  7. Chest x-ray on June 10, 2008, revealed cardiomegaly and chronic      bronchitis.   HISTORY OF PRESENT ILLNESS:  Please see the history and physical  dictated by my partner, Dr. Rodrigo [name], on June 10, 2008, but  briefly, this is a 77 year old Caucasian female with multiple medical  problems outlined in the history and physical but include mild dementia,  hypertension, asthma and chronic bronchitis, generalized but severe  osteoarthritis, and gastroesophageal reflux disease who presented with  some increased dyspnea on exertion and increased pedal and ankle edema  in the setting of three steroid shots to the hip and each shoulder, and  the patient presented with nausea and presyncope as well as subacute  issues with dizziness and falling to the left for approximately one  month.  She was subsequently admitted for  evaluation of a potential  stroke, orthostatic blood pressure measurements, and cardiac  consultation given signs and symptoms of congestive heart failure.  She  did have an elevated lipase but no signs of pancreatitis for which an  abdominal ultrasound, as mentioned above, was obtained.  She was also  noted to be slightly bradycardic in the setting of her presyncope, again  necessitating cardiology consultation.   HOSPITAL COURSE:  1. The patient was seen and evaluated by Dr. Delfin [name] who noted      the presyncope, elevated BNP, longstanding hypertension,      bradycardia, and unsteady gait.  He ordered a 2-D echocardiogram      and also recommended further follow-up of her gallbladder and      further evaluation and management pending the above.  The patient      was diuresed gently with Lasix at 20 mg a day, and her clinical      symptoms of shortness of breath and edema resolved and this was      correlated with a decreasing BNP.  Her weight did decrease      approximately 4.9 kg based on measurements by the staff.  Her blood      pressure remained stable, if not improved, and she was clinically      compensated with the above-mentioned 2-D echocardiogram.  Her      presyncope was not recurrent, her nausea and vomiting resolved, and      she was thought appropriate for discharge with outpatient      management to consist of a stress test.  2. Hypertension remained stable on Altase and Lasix.  Bradycardia      remained stable with heart rates consistently above 60.  Presyncope      was not recurrent.  Cardiac enzymes x3 were negative.  Carotid      Dopplers were unremarkable and outpatient stress test is pending.  3. Cholelithiasis with slightly increased lipase on admission but in      the setting of nausea and vomiting but now symptoms resolved.  The      patient is asymptomatic and eating well with normal LFT on      discharge.  Will follow conservatively  4. Anemia, stable.   Will follow conservatively.  5. Mild dementia, stable if not improved, and followed on Aricept.  6. 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  The patient      will be discharged on high-dose aspirin with discontinuation of      HRT.  Physical and occupational therapy will consult on an      outpatient basis, but inpatient physical therapy noted that she had      no overt balance issues at this time but the patient did note      furniture-walking.  7. Chronic kidney disease with a GFR 40-45, stable during this      hospitalization.  Will discontinue nonsteroidal anti-inflammatory      agents.  8. Allergies and asthma with chronic bronchitis.  Will continue      Singulair and Advair.  9. Gastroesophageal reflux disease.  Will continue proton pump      inhibitor, asymptomatic at this time.  10.Severe degenerative joint disease.  Mobic will be discontinued,      Vicodin continues, and will add Voltaren gel to bilateral shoulders      and other extremities as needed given symptomatic relief obtained      by the patient during this hospitalization.   The patient was thought appropriate for discharge on June 14, 2008,  with close follow-up in our office in approximately ten days and  outpatient management by Dr. Deborah Chalk.      Susan [name], M.D.  Electronically Signed     RA/MEDQ  D:  06/14/2008  T:  06/14/2008  Job:  [id number]   cc:   Colleen Can. Deborah Chalk, M.D.  Fax: [phone]

## 2010-11-20 NOTE — Consult Note (Signed)
NAMEONEIDA, MCKAMEY                ACCOUNT NO.:  [id number]   MEDICAL RECORD NO.:  [id number]          PATIENT TYPE:  INP   LOCATION:  1426                         FACILITY:  [facility]   PHYSICIAN:  Colleen Can. Deborah Chalk, M.D.DATE OF BIRTH:  08-Apr-1921   DATE OF CONSULTATION:  06/11/2008  DATE OF DISCHARGE:                                 CONSULTATION   We were asked by Dr. Waynard Edwards to see Ms. Senseney for evaluation of  presyncope.  She has a history of brady arrhythmias and elevated BNP  suggestive of congestive heart failure.  She had been complaining of  lightheadedness off and on for the last 1-2 months and has had four  different falls and yet other spells reported where she has been able to  catch herself.  She had had no loss of consciousness.  They seem to  occur when she is bending over or catches herself off balance.  She has  had some slight increased shortness of breath.  No real chest pain  syndrome.  She has had no significant palpitations.  She reports some  problems with gait instability and some clumsiness of sorts.  She  presents to the emergency room with nausea and vomiting and had loose  stools yesterday.   PAST MEDICAL HISTORY:  Remarkable for mild Alzheimer's disease, which  she initiated the desire for treatment for Aricept.  She has a history  of asthma, hypertension, polyps, diverticulosis, hot flashes,  gastroesophageal reflux, arthritis, cholelithiasis, previous  tonsillectomy, hemorrhoidectomy, left total knee, hysterectomy and  appendectomy.   ALLERGIES:  PREDNISONE.   CURRENT MEDICATIONS:  1. Lasix 20 mg a day.  2. Altace 10 mg daily.  3. Singulair 10 mg a day.  4. Protonix 40 mg.  5. Aspirin.  6. Advair.   FAMILY HISTORY:  Father died at 16 of suicide.  Mother has hypertension.  One sister died of lymphoma.   SOCIAL HISTORY:  She lives with her daughter.  She has 2 children.  No  tobacco.  No alcohol.   REVIEW OF SYSTEMS:  Include nocturia x2.   History of intermittent  shortness of breath.  No chest pain and all other review of systems are  negative.   PHYSICAL EXAMINATION:  GENERAL:  She is very pleasant and appropriately  dressed.  She is in no acute distress.  VITAL SIGNS:  Blood pressure is 159/65, heart rate is in the 60s.  She  is afebrile.  SKIN:  Skin is warm and dry.  Color is normal.  HEENT:  She is normocephalic, atraumatic.  Pupils equal, round, reactive  to light.  Sclerae nonicteric.  Conjunctivae clear.  Oropharynx is  clear.  NECK:  Supple.  No masses in the neck.  LUNGS:  Lungs are clear.  HEART:  Shows regular rate and rhythm.  No gallop rhythm.  No murmur.  ABDOMEN:  Soft and normal bowel sounds.  EXTREMITIES:  Without edema.  NEUROLOGICAL:  She is intact without focal deficit.   PERTINENT LAB:  Hematocrit is 33, white count is 8000, platelets  245,000.  Chemistries remarkable for sodium 139,  potassium 4.0, chloride  106, CO2 27, BUN 24, creatinine 1.5, glucose is 121.  CK and troponins  are negative.  TSH is pending.  BNP is 473 and 468.  Lipase is 66  decreasing to 49.  CT scan of the head is negative.  Chest x-ray shows  cardiomegaly with chronic bronchitic changes.  EKG shows sinus  bradycardia.   IMPRESSION:  1. Presyncope.  2. Elevated BNP.  3. Longstanding hypertension.  4. Bradycardia.  5. Recent fall with an unsteady gait.  6. History of cholelithiasis.   Will get a 2-D echocardiogram.  Will watch on telemetry and follow BNPs.  She needs to have her gallbladder evaluated.  We will particularly look  at chamber size.  If the right-sided chambers would be enlarged we could  consider the possibility of pulmonary emboli although there is no real  particular reason why she would have that.  Cardiomegaly could be  explained by pericardial disease or more likely related to hypertensive  heart disease and left ventricular hypertrophy and perhaps some  diastolic dysfunction.  Will follow with  you.  It may that as an  outpatient we could consider adenosine Cardiolite study.      Colleen Can. Deborah Chalk, M.D.  Electronically Signed     SNT/MEDQ  D:  06/12/2008  T:  06/12/2008  Job:  [id number]   cc:   Loraine Leriche A. Perini, M.D.  Fax: [phone]   Larina Earthly, M.D.  Fax: [phone]

## 2010-11-20 NOTE — Op Note (Signed)
Susan [name], Susan [name]                ACCOUNT NO.:  [id number]   MEDICAL RECORD NO.:  [id number]          PATIENT TYPE:  AMB   LOCATION:  DAY                          FACILITY:  [facility]   PHYSICIAN:  Thornton Park. Daphine Deutscher, MD  DATE OF BIRTH:  01/22/1921   DATE OF PROCEDURE:  01/16/2009  DATE OF DISCHARGE:                               OPERATIVE REPORT   PREOPERATIVE DIAGNOSIS:  Cholecystitis in early May.   POSTOPERATIVE DIAGNOSIS:  Chronic cholecystitis with evidence of  cholecystolithiasis, both on manual milking of the common bile duct  through the cystic duct and on the cholangiogram.   PROCEDURE:  Laparoscopic cholecystectomy with intraoperative  cholangiogram.   SURGEON:  Luretha Murphy, M.D.   ASSISTANT:  None.   ANESTHESIA:  General endotracheal.   DESCRIPTION OF PROCEDURE:  Susan [name] was taken to room 1 on Monday,  January 16, 2009, and given general anesthesia.  The abdomen was prepped  with a Techni-Care equivalent and draped sterilely.  I entered the  abdomen through the umbilicus with a longitudinal incision without  difficulty and put in a Hasson cannula and insufflated.  Upon  insufflation, I spaced my trocars in the upper abdomen to allow good  access to the gallbladder.  I was anticipating a very thick-walled  gallbladder, scarred and stuck to everything, but instead found a very  skinny gallbladder, but very long with not much of a mesentery.  I  grasped and elevated it and I could see small tiny cholesterol stones  which are yellow and these were nice mulberry-shaped stones, probably  the size of a BB and bigger.  As I dissected free the cystic duct, I  noticed it to be somewhat enlarged.  I went ahead and clipped up on the  gallbladder and I got a critical view, being able to open the mesenteric  window without difficulty using the hook cautery.  With a clip up on the  cystic duct on the gallbladder side, I incised the cystic duct and got  out some sludge that was  particulate fine material, but no stones.  I  went ahead and inserted a Cook catheter into the cystic duct, clipping  it in place, and then taking a dynamic cholangiogram and this showed a  more longer cystic duct going down onto the common duct, which filled  distally first with a nice taper, but there was a filling defect that  was moving in the distal duct and then there was good intrahepatic  filling.  The filling defect in the common duct was consistent with a  stone which was small and it tended to move around.  It remained  consistent in its size and did not appear to be like an air bubble.   I had plenty of room with the cystic duct and elected to go ahead and  dissect it out a little bit more, and then I removed the catheter and  milked the common bile duct.  With that maneuver, this promptly produced  a nice sized stone consistent with the one seen on the cholangiogram,  but also produced  two other such stones, indicating to me there were  probably multiple little stones still within this large dilated common  bile duct column obscured by the half-strength Hypaque.  I milked it  again and again and got no further stone material and elected to triple  clip the cystic duct, divide it, and then I removed the gallbladder  without difficulty and placed it in a bag.  It was brought out through  the umbilicus.  The umbilical defect was then repaired with a figure-of-  eight suture of 0 Vicryl.  I inspected  everything and no bleeding or bile leaks were noted.  The abdomen was  deflated.  The wounds were injected with 0.5% Marcaine and were closed  with 4-0 Vicryl subcutaneously and Benzoin and Steri-Strips.  The  patient seemed to tolerate the procedure well and was taken to the  recovery room in satisfactory condition.      Thornton Park Daphine Deutscher, MD  Electronically Signed     MBM/MEDQ  D:  01/16/2009  T:  01/16/2009  Job:  [id number]   cc:   Larina Earthly, M.D.  Fax: [phone]

## 2010-11-23 NOTE — Discharge Summary (Signed)
Susan [name], Susan [name]                ACCOUNT NO.:  [id number]   MEDICAL RECORD NO.:  [id number]          PATIENT TYPE:  INP   LOCATION:  1301                         FACILITY:  [facility]   PHYSICIAN:  Larina Earthly, M.D.        DATE OF BIRTH:  May 18, 1921   DATE OF ADMISSION:  11/19/2008  DATE OF DISCHARGE:  11/27/2008                               DISCHARGE SUMMARY   DISCHARGE DIAGNOSES:  1. Acute cholecystitis with MRCP revealing normal common bile duct      ruling out choledocholithiasis with no evidence of rupture based on      radiological scanning and clinical exam.  To be discharged on oral      antibiotics and close followup for timing of elective laparoscopic      cholecystectomy.  2. Transient volume overload with pulmonary edema, now resolved and      clinically compensated status post diuresis.  3. Chronic kidney disease, stable.  4. Hypokalemia, repleted.  5. Hypertension, stable.   SECONDARY DIAGNOSES:  1. Asymptomatic multiple gallstones seen in 2009.  2. History of congestive heart failure associated with moderate mitral      regurgitation.  3. Diastolic dysfunction and left ventricular hypertrophy with a left      ventricular ejection fraction of 60%.  4. History of bradycardia and presyncope.  5. Chronic anemia.  6. Mild Alzheimer's dementia.  7. Remote right cerebellar stroke with gait instability.  8. Chronic kidney disease with glomerular filtration rate less than      40.  9. Asthma.  10.Gastroesophageal reflux disease.  11.Osteoarthritis.   DISCHARGE MEDICATIONS:  1. Altace 10 mg daily.  2. Aricept 10 mg daily in the evening.  3. Singulair 10 mg daily in the evening.  4. Fosamax to be held for 1-2 weeks and then resumed.  5. Calcium with vitamin D twice daily.  6. Discontinuation of Premarin.  7. Protonix 20 mg twice daily.  8. Advair Discus twice daily.  9. Lasix 20 mg daily.  10.Bystolic 5 mg daily.  11.Aspirin 325 mg daily.  12.Tylenol 500 mg one-two 3  times a day as needed.  13.Align oral supplement for 2 weeks daily.  14.Avelox 400 mg daily for 3 days.   ALLERGIES:  SULFA DRUGS.   CONSULTATIONS:  1. [place] Daphine Deutscher, MD of Surgery.  2. Hedwig Morton. Juanda Chance, MD of Gastroenterology.   DISCHARGE INSTRUCTIONS:  The patient was instructed to complete  antibiotics and call Dr. Ermalene Searing office for follow up visit to schedule  laparoscopic cholecystectomy.   LABORATORY EVALUATION:  On discharge:  White blood cell count 9.1,  hemoglobin 10.1, hematocrit 31%, platelet count 368.  On admission:  White blood cell count 11.6 rising to 15.7 on the second day of  admission, admission sodium 141, potassium 3.6, BUN 27, creatinine 1.49.  Prior to discharge:  BUN 9, creatinine 1.19, electrolytes normal,  potassium 3.7, albeit the potassium was as low as 3.3 during  hospitalization.  On admission:  Total protein 6.9, albumin 3.5, AST  172, ALT 144, alkaline phosphatase 124, total bilirubin 1.4, lipase 86,  lipase 29.  At the time of discharge:  Total protein 5.6, albumin 2.4.  Liver transaminases normal, alkaline phosphatase 156, total bilirubin  0.9.  Cardiac enzymes x2 were unremarkable.  Urinalysis unremarkable.   DIAGNOSTICS:  1. EKG reveals sinus bradycardia.  2. MRCP on Nov 25, 2008 revealed cholelithiasis without evidence for      biliary obstruction or choledocholithiasis.  Gallbladder wall      appears borderline thickened and slightly irregular raising the      question of cholecystitis, 3.7 cm simple right hepatic cyst.   HISTORY OF PRESENT ILLNESS:  Please see the history and physical  dictated by Dr. Ivery Quale on Nov 19, 2008 for details.  However,  this is an 77 year old Caucasian female with the above mentioned past  medical history, who presented with progressive epigastric pain  associated with nausea, vomiting, fever and subsequently presented to  the [facility] Emergency Room for further evaluation.  There she was  given  IV fluids and started on IV antibiotics for cholecystitis in the  setting of multiple gallstones discovered within the last year.  This  was discussed with Dr. Wenda Low who given the subacute nature of her  presenting symptoms decided to cool down her gallbladder prior to  cholecystectomy.  She was subsequently admitted for IV fluids, IV  antibiotics and close observation.  Her heart failure at the time of  admission was clinically stable and compensated.  She was close to her  baseline renal function at the time of admission.  Her asthma was  stable.   HOSPITAL COURSE:  The patient's symptoms slowly improved with respect to  her abdominal pain and nausea with a decrease in her white blood cell  count while on IV antibiotics and bowel rest. Chronic kidney disease and  hypertension remained stable on significant IV fluids.  However on the  fourth day of admission with observation on IV antibiotics, she became  dyspneic on IV fluids with a clinical picture concerning for pulmonary  edema.  BNP was elevated at 1190.  She was diuresed with IV Lasix and IV  fluids were minimized.  Her continued low-grade abdominal discomfort  yielded the question of whether the patient needed a drain and/or ERCP  per surgical evaluation.  Her LFTs were improving.  She was not having  any further nausea or vomiting.  Her abdomen was soft, but she was only  on clear liquids with a low-grade fever at times.  Dr. Juanda Chance agreed to  consult and recommended an MRCP prior to an invasive procedure.  This  was unremarkable as above for choledocholithiasis.  The patient  continued to improve along with resolution of her liver function tests.  She was eating an advanced diet as well as  tolerating oral medications with no further evidence of volume overload.  She was deemed appropriate for discharge on Nov 27, 2008 with a repleted  potassium level.  She agreed for close followup with Dr. Felipa Eth and Dr.  Daphine Deutscher.   Discharge medications were reviewed with the patient and  rendered appropriate.      Larina Earthly, M.D.  Electronically Signed     RA/MEDQ  D:  02/01/2009  T:  02/02/2009  Job:  [id number]

## 2010-11-23 NOTE — Procedures (Signed)
[facility]  Patient:    Susan [name], Susan [name]                       MRN: [id number] Adm. Date:  [id number] Attending:  Mervin Hack CC:         Titus Dubin. Alwyn Ren, M.D. [facility]   Procedure Report  PROCEDURE:  Colonoscopy.  ENDOSCOPIST:  Hedwig Morton. Juanda Chance, M.D.  INDICATIONS:  This 77 year old white female had Hemoccult-positive stool as checked by Hemoccult cards in Dr. Titus Dubin. [place] office.  On my physical exam, she was also trace Hemoccult-positive.  She denies any blood per rectum. She denies abdominal pain or diarrhea.  There is no family history of colon cancer.  She had hemorrhoid surgery about 36 years ago.  She has a history of gastroesophageal reflux disease.  She is undergoing colonoscopy to rule out colon neoplasm.  ENDOSCOPE:  Olympus single-channel video endoscope.  SEDATION:  Versed 6 mg IV, Demerol 70 mg IV.  FINDINGS:  Olympus single-channel video endoscope was passed under direct vision through the rectum to the sigmoid colon.  Patient was monitored by a pulse oximeter; her oxygen saturations were satisfactory.  Her prep was excellent.  Rectal tone was decreased.  Anal canal showed previous hemorrhoidectomy with residual hemorrhoidal tissue prolapsing through the anal canal.  There were no bleeding hemorrhoids.  Sigmoid colon showed multiple diverticula but no evidence of mucosal lesions.  Haustral folds were enlarged. Splenic flexure, transverse colon and hepatic flexure were unremarkable. Cecal pouch was reached with some difficulty because of redundant colon. Cecal pouch and ileocecal valve were visualized fully and showed normal mucosa.  There were no AVMs.  As the colonoscope was retracted, we found a polyp at the hepatic flexure at 70 cm from the rectal os which was 1 cm in diameter and it was on a short stalk.  It was snared and sent to pathology. There was no bleeding from post-polypectomy site.  Patient tolerated  procedure well.  IMPRESSION: 1. Right colon polyp, status post snare polypectomy. 2. Mild-to-moderate diverticulosis of the left colon.  PLAN: 1. Stay on post-polypectomy instructions, avoiding aspirin and NSAIDs for two    weeks. 2. Treat the diverticulosis with high-fiber diet and possible fiber    supplements. 3. Await results of the polyp histology.  If this is an adenomatous polyp, we    suggest repeat colonoscopy in three years. DD:  01/01/01 TD:  01/01/01 Job: [id number]

## 2010-11-28 ENCOUNTER — Encounter: Payer: Self-pay | Admitting: Internal Medicine

## 2010-11-28 ENCOUNTER — Ambulatory Visit (INDEPENDENT_AMBULATORY_CARE_PROVIDER_SITE_OTHER): Payer: Medicare Other | Admitting: Internal Medicine

## 2010-11-28 ENCOUNTER — Encounter: Payer: Self-pay | Admitting: *Deleted

## 2010-11-28 DIAGNOSIS — I495 Sick sinus syndrome: Secondary | ICD-10-CM

## 2010-11-28 DIAGNOSIS — I10 Essential (primary) hypertension: Secondary | ICD-10-CM

## 2010-11-28 DIAGNOSIS — I4891 Unspecified atrial fibrillation: Secondary | ICD-10-CM

## 2010-11-28 DIAGNOSIS — I5032 Chronic diastolic (congestive) heart failure: Secondary | ICD-10-CM

## 2010-11-28 DIAGNOSIS — I509 Heart failure, unspecified: Secondary | ICD-10-CM

## 2010-11-28 LAB — BASIC METABOLIC PANEL
BUN: 24 mg/dL — ABNORMAL HIGH (ref 6–23)
Chloride: 103 mEq/L (ref 96–112)
Glucose, Bld: 86 mg/dL (ref 70–99)
Potassium: 3.3 mEq/L — ABNORMAL LOW (ref 3.5–5.1)

## 2010-11-28 LAB — CBC WITH DIFFERENTIAL/PLATELET
Basophils Relative: 0.2 % (ref 0.0–3.0)
Eosinophils Relative: 0.3 % (ref 0.0–5.0)
HCT: 37.5 % (ref 36.0–46.0)
Lymphs Abs: 2.4 10*3/uL (ref 0.7–4.0)
MCV: 87 fl (ref 78.0–100.0)
Monocytes Relative: 7 % (ref 3.0–12.0)
Neutrophils Relative %: 68.1 % (ref 43.0–77.0)
Platelets: 229 10*3/uL (ref 150.0–400.0)
RBC: 4.31 Mil/uL (ref 3.87–5.11)
WBC: 10 10*3/uL (ref 4.5–10.5)

## 2010-11-28 LAB — PROTIME-INR
INR: 2.4 ratio — ABNORMAL HIGH (ref 0.8–1.0)
Prothrombin Time: 24.7 s — ABNORMAL HIGH (ref 10.2–12.4)

## 2010-11-28 NOTE — Patient Instructions (Signed)

## 2010-11-29 ENCOUNTER — Encounter: Payer: Self-pay | Admitting: Internal Medicine

## 2010-11-29 ENCOUNTER — Telehealth: Payer: Self-pay | Admitting: *Deleted

## 2010-11-29 DIAGNOSIS — E876 Hypokalemia: Secondary | ICD-10-CM

## 2010-11-29 DIAGNOSIS — I495 Sick sinus syndrome: Secondary | ICD-10-CM | POA: Insufficient documentation

## 2010-11-29 NOTE — Telephone Encounter (Signed)
Called pt. Potassium level 3.3  Pt to start  Potassium 20 mEq daily per Dr. Johney Frame MD. Left a message to call back.

## 2010-11-29 NOTE — Telephone Encounter (Signed)
LMOM for call back. Need pharmacy name to send in KCL order.

## 2010-11-29 NOTE — Telephone Encounter (Signed)
Pt returned the call from this am regarding the lab results

## 2010-11-29 NOTE — Telephone Encounter (Signed)
Left a message  Regarding potassium level and need pharmacy name to send prescription.

## 2010-11-29 NOTE — Assessment & Plan Note (Signed)
Rate control will be essential longterm for CHF management.

## 2010-11-29 NOTE — Assessment & Plan Note (Signed)
The patient has persistent (and probably permanent) atrial fibrillation.  She is appropriately anticoagulated with coumadin.  Recently, she has had progressive symptoms of tachycardia as well as bradycardia.  Her recent event monitor has confirmed tachy/brady with pauses >3 seconds.  She has therefore been taken off of her AV nodal agents.  I agree with Dr Swaziland, that a PPM would be the most prudent strategy at this time with subsequent rate control and anticoagulation longterm. Risks, benefits, alternatives to pacemaker implantation were discussed in detail with the patient today. The patient understands that the risks include but are not limited to bleeding, infection, pneumothorax, perforation, tamponade, vascular damage, renal failure, MI, stroke, death,  and lead dislodgement and wishes to proceed. We will therefore schedule the procedure at the next available time.

## 2010-11-29 NOTE — Progress Notes (Signed)
Susan [name] is a pleasant 77 y.o. WF patient with a h/o persistent (and probably permanent) atrial fibrillation with demonstrated tachycardia/ bradycardia syndrome who presents today for EP consultation.   She recently presented to Dr Swaziland for follow-up and complained of significant fatigue and SOB at that time.  She also reported episodes of dizziness.  She was found to have atrial fibrillation.  She had a holter monitor placed which documented persistent atrial fibrillation with episodes of tachycardia with HR 130s as well as bradycardia with multiple pauses > 3 seconds.  She confirms dizziness at times, likely related to pauses.  She reports presyncope several months ago but none recently.  Due to prolonged pauses, her AV nodal blocking agents were discontinued and she is now referred to our office for further evaluation.  Today, she denies symptoms of  chest pain, orthopnea, PND, lower extremity edema, or neurologic sequela. The patient is tolerating medications without difficulties and is otherwise without complaint today.   Past Medical History  Diagnosis Date  . Hypertension   . Asthma   . Dementia     mild  . Osteoarthritis   . Gallstones   . Chronic kidney disease     Stage 2 to 3  . Anemia   . Stroke     Remote R cerebellar stroke with gait abnormaility  . Atrial fibrillation     likely permanent  . Coronary artery disease   . Anticoagulant long-term use   . Diastolic dysfunction   . Valvular heart disease     Mitral and tricuspid insufficiency   Past Surgical History  Procedure Date  . Abdominal hysterectomy     total  . Appendectomy   . Hemorrhoid surgery   . Cataract extraction, bilateral   . Total knee arthroplasty   . Cholecystectomy     Current Outpatient Prescriptions  Medication Sig Dispense Refill  . aspirin 81 MG tablet Take 81 mg by mouth daily.        . Calcium Carbonate (CALTRATE 600 PO) Take by mouth 2 (two) times daily.       . Cholecalciferol  (VITAMIN D-3 PO) Take 2,000 mg by mouth 2 (two) times daily.        . Chromium Picolinate 200 MCG CAPS Take by mouth.        . diclofenac sodium (VOLTAREN) 1 % GEL Apply topically as needed.       . docusate sodium (COLACE) 100 MG capsule Take 100 mg by mouth as needed.        . donepezil (ARICEPT) 10 MG tablet Take 10 mg by mouth at bedtime.        Marland Kitchen estrogens, conjugated, (PREMARIN) 0.625 MG tablet Take 0.625 mg by mouth daily. Take daily for 21 days then do not take for 7 days.       . fish oil-omega-3 fatty acids 1000 MG capsule Take 2 g by mouth daily.        . fluticasone (FLONASE) 50 MCG/ACT nasal spray 2 sprays by Nasal route 2 (two) times daily as needed.        . Fluticasone-Salmeterol (ADVAIR DISKUS) 250-50 MCG/DOSE AEPB Inhale 1 puff into the lungs every 12 (twelve) hours.        . furosemide (LASIX) 40 MG tablet Take 20 mg by mouth 2 (two) times daily.       . Garlic 100 MG TABS Take by mouth.        . Glucosamine & Fish Oil 500-400-60-40  MG CAPS Take by mouth 2 (two) times daily.        Marland Kitchen HYDROcodone-acetaminophen (NORCO) 10-325 MG per tablet Take 1 tablet by mouth every 6 (six) hours as needed.        . metoprolol (TOPROL-XL) 50 MG 24 hr tablet Take 50 mg by mouth daily.        . montelukast (SINGULAIR) 10 MG tablet Take 10 mg by mouth at bedtime.        . Multiple Vitamins-Minerals (OCUVITE ADULT 50+ PO) Take by mouth daily.        . multivitamin (THERAGRAN) per tablet Take 1 tablet by mouth daily.        Marland Kitchen warfarin (COUMADIN) 5 MG tablet Take 5 mg by mouth as directed.        . digoxin (LANOXIN) 0.125 MG tablet Take 125 mcg by mouth daily.        Marland Kitchen diltiazem (CARDIZEM) 120 MG tablet Take 120 mg by mouth daily.          Allergies  Allergen Reactions  . Prednisone     Long term has reaction; felt like passing out  . Sulfa Antibiotics Nausea And Vomiting  . Amoxicillin Rash    History   Social History  . Marital Status: Widowed    Spouse Name: N/A    Number of  Children: N/A  . Years of Education: N/A   Occupational History  . Not on file.   Social History Main Topics  . Smoking status: Never Smoker   . Smokeless tobacco: Not on file  . Alcohol Use: No  . Drug Use: No  . Sexually Active: Not on file   Other Topics Concern  . Not on file   Social History Narrative   Pt lives in [place] with daughter.Retired from an Scientist, forensic.    Family History  Problem Relation Age of Onset  . Clotting disorder Mother   . Suicidality Father     ROS- All systems are reviewed and negative except as per the HPI above  Physical Exam: Filed Vitals:   11/28/10 1209  BP: 122/83  Pulse: 118  Height: 5\' 6"  (1.676 m)  Weight: 150 lb (68.04 kg)    GEN- The patient is elderly  appearing, alert and oriented x 3 today.   Head- normocephalic, atraumatic Eyes-  Sclera clear, conjunctiva pink Ears- hearing intact Oropharynx- clear Neck- supple, no JVP Lymph- no cervical lymphadenopathy Lungs- Clear to ausculation bilaterally, normal work of breathing Heart- irregular rate and rhythm, no murmurs, rubs or gallops, PMI not laterally displaced GI- soft, NT, ND, + BS Extremities- no clubbing, cyanosis, or edema MS- no significant deformity or atrophy Skin- no rash or lesion Psych- euthymic mood, full affect Neuro- strength and sensation are intact  EKG today reveals afib, V rate 118, anteroseptal infarct pattern Event monitor 11/12/10 has documented multiple pauses of > 3 seconds as well as afib with RVR.  Assessment and Plan:

## 2010-11-29 NOTE — Assessment & Plan Note (Signed)
See above

## 2010-11-29 NOTE — Assessment & Plan Note (Signed)
Stable No change required today  

## 2010-11-30 MED ORDER — POTASSIUM CHLORIDE CRYS ER 20 MEQ PO TBCR: 20.0000 meq | EXTENDED_RELEASE_TABLET | Freq: Every day | ORAL | Status: DC

## 2010-11-30 NOTE — Telephone Encounter (Signed)
A prescription was send for  Potassium 20 mEq , take on tablet by mouth daily to [facility] [address]. Patient aware.

## 2010-11-30 NOTE — Telephone Encounter (Signed)
PT USES [facility] [address]

## 2010-12-04 ENCOUNTER — Ambulatory Visit (HOSPITAL_COMMUNITY)
Admission: RE | Admit: 2010-12-04 | Discharge: 2010-12-05 | Disposition: A | Discharge status: Home / Self Care | Payer: Medicare Other | Source: Ambulatory Visit | Attending: Internal Medicine | Admitting: Internal Medicine

## 2010-12-04 DIAGNOSIS — I2510 Atherosclerotic heart disease of native coronary artery without angina pectoris: Secondary | ICD-10-CM | POA: Insufficient documentation

## 2010-12-04 DIAGNOSIS — I495 Sick sinus syndrome: Secondary | ICD-10-CM

## 2010-12-04 DIAGNOSIS — I10 Essential (primary) hypertension: Secondary | ICD-10-CM | POA: Insufficient documentation

## 2010-12-04 DIAGNOSIS — I5032 Chronic diastolic (congestive) heart failure: Secondary | ICD-10-CM | POA: Insufficient documentation

## 2010-12-04 DIAGNOSIS — Z79899 Other long term (current) drug therapy: Secondary | ICD-10-CM | POA: Insufficient documentation

## 2010-12-04 DIAGNOSIS — Z7901 Long term (current) use of anticoagulants: Secondary | ICD-10-CM | POA: Insufficient documentation

## 2010-12-04 DIAGNOSIS — I509 Heart failure, unspecified: Secondary | ICD-10-CM | POA: Insufficient documentation

## 2010-12-04 DIAGNOSIS — J45909 Unspecified asthma, uncomplicated: Secondary | ICD-10-CM | POA: Insufficient documentation

## 2010-12-04 DIAGNOSIS — I4891 Unspecified atrial fibrillation: Secondary | ICD-10-CM | POA: Insufficient documentation

## 2010-12-04 LAB — SURGICAL PCR SCREEN
MRSA, PCR: NEGATIVE
Staphylococcus aureus: NEGATIVE

## 2010-12-04 LAB — BASIC METABOLIC PANEL
Calcium: 9.2 mg/dL (ref 8.4–10.5)
Creatinine, Ser: 1.31 mg/dL — ABNORMAL HIGH (ref 0.4–1.2)
GFR calc Af Amer: 46 mL/min — ABNORMAL LOW (ref >60)
Sodium: 141 mEq/L (ref 135–145)

## 2010-12-04 LAB — PROTIME-INR: INR: 2 — ABNORMAL HIGH (ref 0.00–1.49)

## 2010-12-05 ENCOUNTER — Ambulatory Visit (HOSPITAL_COMMUNITY): Payer: Medicare Other

## 2010-12-05 LAB — PROTIME-INR
INR: 1.92 — ABNORMAL HIGH (ref 0.00–1.49)
Prothrombin Time: 22.1 seconds — ABNORMAL HIGH (ref 11.6–15.2)

## 2010-12-17 ENCOUNTER — Ambulatory Visit: Payer: Medicare Other | Admitting: *Deleted

## 2010-12-17 ENCOUNTER — Encounter: Payer: Self-pay | Admitting: Nurse Practitioner

## 2010-12-17 ENCOUNTER — Ambulatory Visit (INDEPENDENT_AMBULATORY_CARE_PROVIDER_SITE_OTHER): Payer: Medicare Other | Admitting: Nurse Practitioner

## 2010-12-17 ENCOUNTER — Ambulatory Visit (INDEPENDENT_AMBULATORY_CARE_PROVIDER_SITE_OTHER): Payer: Medicare Other | Admitting: *Deleted

## 2010-12-17 VITALS — BP 112/68 | HR 114 | Ht 66.0 in | Wt 154.8 lb

## 2010-12-17 DIAGNOSIS — IMO0002 Reserved for concepts with insufficient information to code with codable children: Secondary | ICD-10-CM

## 2010-12-17 DIAGNOSIS — I495 Sick sinus syndrome: Secondary | ICD-10-CM

## 2010-12-17 DIAGNOSIS — I4891 Unspecified atrial fibrillation: Secondary | ICD-10-CM

## 2010-12-17 DIAGNOSIS — I10 Essential (primary) hypertension: Secondary | ICD-10-CM

## 2010-12-17 MED ORDER — DILTIAZEM HCL ER COATED BEADS 120 MG PO CP24: 120.0000 mg | ORAL_CAPSULE | Freq: Every day | ORAL | Status: DC

## 2010-12-17 NOTE — Progress Notes (Signed)
[name] Date of Birth: Jul 28, 1920   History of Present Illness: Susan [name] is seen today for a post hospital visit. She is seen for Dr. Swaziland. She has had a recent VVI pacemaker. She is in chronic atrial fib. She remains on her coumadin. She was not discharged on her Diltiazem but had her metoprolol increased to 100 mg per day. Her digoxin was cut in half. She feels ok, but not any better since the implant. Her rate is still not well controlled. She is back to all of her activities. Her wound looks good. She has no real complaint.   Current Outpatient Prescriptions on File Prior to Visit  Medication Sig Dispense Refill  . aspirin 81 MG tablet Take 81 mg by mouth daily.        . Calcium Carbonate (CALTRATE 600 PO) Take by mouth 2 (two) times daily.       . Cholecalciferol (VITAMIN D-3 PO) Take 2,000 mg by mouth daily.       . Chromium Picolinate 200 MCG CAPS Take by mouth.        . diclofenac sodium (VOLTAREN) 1 % GEL Apply topically as needed.       . digoxin (LANOXIN) 0.125 MG tablet Take 125 mcg by mouth daily.       Marland Kitchen docusate sodium (COLACE) 100 MG capsule Take 100 mg by mouth as needed.        . donepezil (ARICEPT) 10 MG tablet Take 10 mg by mouth at bedtime.        . fish oil-omega-3 fatty acids 1000 MG capsule Take 2 g by mouth daily.        . fluticasone (FLONASE) 50 MCG/ACT nasal spray 2 sprays by Nasal route 2 (two) times daily as needed.        . Fluticasone-Salmeterol (ADVAIR DISKUS) 250-50 MCG/DOSE AEPB Inhale 1 puff into the lungs every 12 (twelve) hours.        . furosemide (LASIX) 40 MG tablet Take 20 mg by mouth 2 (two) times daily.       . Garlic 100 MG TABS Take by mouth.        . Glucosamine & Fish Oil 500-400-60-40 MG CAPS Take by mouth 2 (two) times daily.        Marland Kitchen HYDROcodone-acetaminophen (NORCO) 10-325 MG per tablet Take 1 tablet by mouth every 6 (six) hours as needed.        . metoprolol (TOPROL-XL) 50 MG 24 hr tablet Take 100 mg by mouth daily.       .  montelukast (SINGULAIR) 10 MG tablet Take 10 mg by mouth at bedtime.        . Multiple Vitamins-Minerals (OCUVITE ADULT 50+ PO) Take by mouth daily.        . multivitamin (THERAGRAN) per tablet Take 1 tablet by mouth daily.        . potassium chloride SA (K-DUR,KLOR-CON) 20 MEQ tablet Take 1 tablet (20 mEq total) by mouth daily.  30 tablet  6  . warfarin (COUMADIN) 5 MG tablet Take 5 mg by mouth as directed.        . diltiazem (CARDIZEM CD) 120 MG 24 hr capsule Take 1 capsule (120 mg total) by mouth daily.  30 capsule  11  . DISCONTD: diltiazem (CARDIZEM) 120 MG tablet Take 60 mg by mouth daily.       Marland Kitchen DISCONTD: estrogens, conjugated, (PREMARIN) 0.625 MG tablet Take 0.625 mg by mouth daily. Take daily for  21 days then do not take for 7 days.         Allergies  Allergen Reactions  . Prednisone     Long term has reaction; felt like passing out  . Sulfa Antibiotics Nausea And Vomiting  . Amoxicillin Rash    Past Medical History  Diagnosis Date  . Hypertension   . Asthma   . Dementia     mild  . Osteoarthritis   . Gallstones   . Chronic kidney disease     Stage 2 to 3  . Anemia   . Stroke     Remote R cerebellar stroke with gait abnormaility  . Atrial fibrillation     likely permanent  . Coronary artery disease   . Anticoagulant long-term use   . Diastolic dysfunction   . Valvular heart disease     Mitral and tricuspid insufficiency  . Tachy-brady syndrome   . Pacemaker May 2012    VVI per Dr. Johney Frame    Past Surgical History  Procedure Date  . Abdominal hysterectomy     total  . Appendectomy   . Hemorrhoid surgery   . Cataract extraction, bilateral   . Total knee arthroplasty   . Cholecystectomy   . Pacemaker insertion May 2012    VVI implant. Dr. Johney Frame    History  Smoking status  . Never Smoker   Smokeless tobacco  . Not on file    History  Alcohol Use No    Family History  Problem Relation Age of Onset  . Clotting disorder Mother   . Suicidality  Father     Review of Systems: The review of systems is as above. No bleeding. No bruising. No falls. No chest pain or shortness of breath.  All other systems were reviewed and are negative.  Physical Exam: BP 112/68  Pulse 114  Ht 5\' 6"  (1.676 m)  Wt 154 lb 12.8 oz (70.217 kg)  BMI 24.99 kg/m2 Patient is very pleasant and in no acute distress. She looks younger than her stated age. Skin is warm and dry. Color is normal.  HEENT is unremarkable. Normocephalic/atraumatic. PERRL. Sclera are nonicteric. Neck is supple. No masses. No JVD. Lungs are clear. Cardiac exam shows an irregular rate and rhythm. Abdomen is soft. Extremities are without edema. Gait and ROM are intact. No gross neurologic deficits noted.  LABORATORY DATA: EKG shows atrial fib. Ventricular rate still not well controlled.    Assessment / Plan:

## 2010-12-17 NOTE — Patient Instructions (Signed)
Restart the Diltiazem 120 mg daily. I will see you back in one week. Continue to monitoring your blood pressure. Call for any problems

## 2010-12-17 NOTE — Assessment & Plan Note (Signed)
She is s/p PTVP and has a VVI pacer in place. Her rate is still not well controlled. I have added back the Diltiazem 120 mg daily. Hopefully her blood pressure will tolerate this. We may need to add back the full dose of Lanoxin. I will see her back in one week. Check BMET on return. She has her coumadin checked with Dr. Felipa Eth. Patient is agreeable to this plan and will call if any problems develop in the interim.

## 2010-12-17 NOTE — Discharge Summary (Signed)
Susan [name], Susan [name]                ACCOUNT NO.:  [id number]  MEDICAL RECORD NO.:  [id number]           PATIENT TYPE:  O  LOCATION:  4712                         FACILITY:  [facility]  PHYSICIAN:  Hillis Range, MD       DATE OF BIRTH:  Jan 09, 1921  DATE OF ADMISSION:  12/04/2010 DATE OF DISCHARGE:  12/05/2010                              DISCHARGE SUMMARY   PRIMARY CARE PHYSICIAN:  Larina Earthly, MD  PRIMARY CARDIOLOGIST:  Peter M. Swaziland, MD  ELECTROPHYSIOLOGIST:  Hillis Range, MD  PRIMARY DIAGNOSIS:  Tachybrady syndrome.  SECONDARY DIAGNOSES: 1. Atrial fibrillation. 2. Chronic anticoagulation. 3. Chronic diastolic heart failure. 4. Hypertension. 5. Asthma. 6. Mild dementia. 7. Osteoarthritis. 8. Gallstones. 9. Chronic kidney disease - stage II-III. 10.Anemia. 11.Remote stroke. 12.Coronary artery disease. 13.Status post abdominal hysterectomy. 14.Status post appendectomy.  ALLERGIES:  The patient is allergic to PREDNISONE, SULFA, and AMOXICILLIN.  PROCEDURES THIS ADMISSION: 1. Implantation of VVI pacemaker on Dec 04, 2010, by Dr. Johney Frame.  The     patient received a [employer], modal [id number] pacemaker with     modal [id number] right ventricular lead.  The patient had no early     apparent complications. 2. Chest x-ray on Dec 05, 2010, demonstrated no pneumothorax, status     post pacemaker implant.  RECENT HISTORY OF PRESENT ILLNESS:  Ms. Sunde is an 77 year old female with a longstanding history of atrial fibrillation.  She has had progressive symptoms of tachycardia as well as bradycardia.  She underwent event monitoring by Dr. Swaziland, which confirmed pauses of greater than 3 seconds.  She was evaluated by Dr. Johney Frame.  He felt that pacemaker implant was the most appropriate strategy.  Risks, benefits, and alternatives of the procedure were discussed with the patient she wished to proceed.  HOSPITAL COURSE:  The patient was admitted on Dec 04, 2010, for  planned implantation of single-chamber pacemaker.  This was carried out by Dr. Johney Frame.  The details are outlined above.  The patient was monitored on telemetry overnight, which demonstrated atrial fibrillation with rapid ventricular response.  The patient had a chest x-ray on Dec 05, 2010, which demonstrated no pneumothorax status post pacemaker implant.  Her device was interrogated and found to be functioning normally.  Her left chest was without hematoma or ecchymosis.  Dr. Johney Frame examined the patient on Dec 05, 2010, and considered her stable for discharge with plans to increase metoprolol to 100 mg daily and add digoxin 0.0625 mg daily.  The patient will have postop patient followup with Norma Fredrickson, nurse practitioner, in the next week.  FOLLOWUP APPOINTMENTS: 1. [facility] on December 17, 2010, at 2 p.m. 2. Norma Fredrickson, nurse practitioner, on December 17, 2010, at 3 p.m. 3. Dr. Swaziland on January 15, 2011, at 11 a.m. 4. Dr. Johney Frame in August 2012, the office will call to schedule     that appointment. 5. Dr. Felipa Eth as scheduled. 6. Dr. Vicente Males office within 1 week for Coumadin followup.  DISCHARGE INSTRUCTIONS: 1. Increase activity slowly. 2. No driving for 1 week. 3. Follow a low-sodium heart-healthy diet. 4. See  supplemental device discharge instructions for wound care and     arm mobility. 5. Keep incisions clean and dry for 1 week.  DISCHARGE MEDICATIONS: 1. Digoxin 0.125 mg one-half tablet daily. 2. Metoprolol succinate 100 mg daily. 3. Advair 250/50 one puff twice daily. 4. Aricept 10 mg daily. 5. Aspirin 81 mg daily. 6. Calcium/vitamin D twice daily. 7. Warfarin 5 mg one tablet daily except Tuesday and Friday when she     takes one and a half tablet. 8. Docusate 100 mg one to three capsules every evening as needed for     constipation. 9. Flunisolide nasal spray two sprays in each nostril twice daily. 10.Garlic extract 1610 mg daily. 11.Glucosamine  twice daily. 12.Lasix 20 mg two tablets daily. 13.Multivitamin one-half tablet daily. 14.Ocuvite twice daily. 15.Omega-3 daily. 16.Singulair 2 mg daily. 17.Chromium daily. 18.Vitamin D3 daily.  LABORATORY DATA THIS ADMISSION:  INR 1.92.  Potassium 3.9, BUN 24, creatinine 1.31.  DISPOSITION:  The patient was seen and examined by Dr. Felipa Eth on Dec 05, 2010, and considered stable for discharge.  DURATION OF DISCHARGE ENCOUNTER:  35 minutes.     Gypsy Balsam, RN,BSN   ______________________________ Hillis Range, MD    AS/MEDQ  D:  12/05/2010  T:  12/06/2010  Job:  [id number]  cc:   Peter M. [place]iland, M.D. Larina Earthly, M.D.  Electronically Signed by Gypsy Balsam RNBSN on 12/12/2010 05:38:12 PM Electronically Signed by Hillis Range MD on 12/17/2010 09:19:06 AM

## 2010-12-17 NOTE — Op Note (Signed)
Susan [name], Susan [name]                ACCOUNT NO.:  [id number]  MEDICAL RECORD NO.:  [id number]           PATIENT TYPE:  O  LOCATION:  4712                         FACILITY:  [facility]  PHYSICIAN:  Hillis Range, MD       DATE OF BIRTH:  06-15-1921  DATE OF PROCEDURE: DATE OF DISCHARGE:                              OPERATIVE REPORT   SURGEON:  Hillis Range, MD  PREPROCEDURE DIAGNOSES: 1. Tachycardia/bradycardia syndrome. 2. Permanent atrial fibrillation.  POSTPROCEDURE DIAGNOSES: 1. Tachycardia/bradycardia syndrome. 2. Permanent atrial fibrillation.  PROCEDURES:  Pacemaker implantation.  INTRODUCTION:  [name] is a pleasant 77 year old female with a history of permanent atrial fibrillation and demonstrated tachycardia/bradycardia syndrome, who presents for pacemaker implantation.  She has been followed by Dr. Swaziland where she recently presented with shortness of breath and recurrent episodes of dizziness. She had an event monitor placed which documented atrial fibrillation with episodes of tachycardia with heart rates in the 130s as well as bradycardia with multiple pauses in excess of 3 seconds.  She confirms dizziness at times, likely related to pauses.  Rate control for her atrial fibrillation has been difficult due to prolonged bradycardia. She, therefore, presents today for pacemaker implantation.  DESCRIPTION OF PROCEDURE:  Informed written consent was obtained and the patient was brought to the electrophysiology lab in the fasting state. She was adequately sedated with intravenous Versed as outlined in the nursing report.  The patient's left chest was prepped and draped in the usual sterile fashion by the EP lab staff.  The skin overlying the left deltopectoral region was infiltrated with lidocaine for local analgesia. A 3-cm incision was made over the left deltopectoral region.  A left subcutaneous pacemaker pocket was fashioned using a combination of sharp and blunt  dissection.  Electrocautery was required to assure hemostasis. The left cephalic vein was directly visualized and cannulated.  Through the left axillary vein, a [employer] IsoFlex, model [id number] (serial number [id number]) right ventricular lead was advanced with fluoroscopic visualization into the right ventricular apex position.  No contrast was required for the procedure today.  R-waves measured 17 mV with an impedance of 600 ohms and a threshold of 0.5 V at 0.4 msec.  The lead was secured to the pectoralis fascia using #2 silk suture over the suture sleeve.  The lead was then connected to a [employer] Accent SR RF, model [id number] (serial number [id number]) pacemaker.  The pocket was irrigated with copious gentamicin solution.  The pacemaker was then placed into the pocket.  The pocket was then closed in 2 layers with 2.0 Vicryl suture for the subcutaneous and subcuticular layers.  Steri- Strips and a sterile dressing were then applied.  There were no early apparent complications.  CONCLUSIONS: 1. Successful implantation of an Accent SR RF pacemaker for permanent     atrial fibrillation with tachycardia/bradycardia syndrome. 2. No early apparent complications.     Hillis Range, MD     JA/MEDQ  D:  12/04/2010  T:  12/05/2010  Job:  [id number]  cc:   Peter M. [place]il[place], M.D. Larina Earthly, M.D.  Electronically Signed  by Hillis Range MD on 12/17/2010 09:18:58 AM

## 2010-12-17 NOTE — Progress Notes (Signed)
Pacer check in clinic  

## 2010-12-17 NOTE — Assessment & Plan Note (Signed)
Blood pressure is on the low normal side. She is currently not having any dizziness or lightheadedness. She will continue to monitor at home. Hopefully her blood pressure will come up as her rate is better controlled.

## 2010-12-24 ENCOUNTER — Encounter: Payer: Self-pay | Admitting: Nurse Practitioner

## 2010-12-24 ENCOUNTER — Ambulatory Visit (INDEPENDENT_AMBULATORY_CARE_PROVIDER_SITE_OTHER): Payer: Medicare Other | Admitting: Nurse Practitioner

## 2010-12-24 VITALS — BP 110/78 | HR 125 | Wt 154.0 lb

## 2010-12-24 DIAGNOSIS — I4891 Unspecified atrial fibrillation: Secondary | ICD-10-CM

## 2010-12-24 MED ORDER — DIGOXIN 125 MCG PO TABS: 250.0000 ug | ORAL_TABLET | Freq: Every day | ORAL | Status: DC

## 2010-12-24 NOTE — Patient Instructions (Signed)
Increase your Digoxin to a whole tablet (0.25) See Dr. Swaziland as arranged on July 10th Call for any problems Continue to monitor your blood pressure and heart rate at home.

## 2010-12-24 NOTE — Progress Notes (Signed)
Susan [name] Date of Birth: October 11, 1920   History of Present Illness: Susan [name] is seen back today for a one week check. She is seen for Dr. Sw[place]iland. She continues to have palpitations. She is in chronic atrial fib and has her pacemaker in place now. Her rate has been difficult to control. She is back on some low dose Cardizem. She was put on just half dose of her Digoxin at her hospitalization. It does not really say why this was cut back. She continues to note elevation in her heart rates. Blood pressure is in the 90 to 110 systolic range. She gets a little short winded when her heart rate is going fast. No chest pain. She is a little fatigued. She wonders if her age may be playing a role. She is 77 years old.   Current Outpatient Prescriptions on File Prior to Visit  Medication Sig Dispense Refill  . aspirin 81 MG tablet Take 81 mg by mouth daily.        . Calcium Carbonate (CALTRATE 600 PO) Take by mouth 2 (two) times daily.       . Cholecalciferol (VITAMIN D-3 PO) Take 2,000 mg by mouth daily.       . Chromium Picolinate 200 MCG CAPS Take by mouth.        . diclofenac sodium (VOLTAREN) 1 % GEL Apply topically as needed.       . diltiazem (CARDIZEM CD) 120 MG 24 hr capsule Take 1 capsule (120 mg total) by mouth daily.  30 capsule  11  . docusate sodium (COLACE) 100 MG capsule Take 100 mg by mouth as needed.        . donepezil (ARICEPT) 10 MG tablet Take 10 mg by mouth at bedtime.        . fish oil-omega-3 fatty acids 1000 MG capsule Take 2 g by mouth daily.        . fluticasone (FLONASE) 50 MCG/ACT nasal spray 2 sprays by Nasal route 2 (two) times daily as needed.        . Fluticasone-Salmeterol (ADVAIR DISKUS) 250-50 MCG/DOSE AEPB Inhale 1 puff into the lungs every 12 (twelve) hours.        . furosemide (LASIX) 40 MG tablet Take 20 mg by mouth 2 (two) times daily.       . Garlic 100 MG TABS Take by mouth.        . Glucosamine & Fish Oil 500-400-60-40 MG CAPS Take by mouth 2 (two) times  daily.        Marland Kitchen HYDROcodone-acetaminophen (NORCO) 10-325 MG per tablet Take 1 tablet by mouth every 6 (six) hours as needed.        . metoprolol (TOPROL-XL) 50 MG 24 hr tablet Take 100 mg by mouth daily.       . montelukast (SINGULAIR) 10 MG tablet Take 10 mg by mouth at bedtime.        . Multiple Vitamins-Minerals (OCUVITE ADULT 50+ PO) Take by mouth daily.        . multivitamin (THERAGRAN) per tablet Take 1 tablet by mouth daily.        . potassium chloride SA (K-DUR,KLOR-CON) 20 MEQ tablet Take 1 tablet (20 mEq total) by mouth daily.  30 tablet  6  . warfarin (COUMADIN) 5 MG tablet Take 5 mg by mouth as directed.        Marland Kitchen DISCONTD: digoxin (LANOXIN) 0.125 MG tablet Take 125 mcg by mouth daily.  Allergies  Allergen Reactions  . Prednisone     Long term has reaction; felt like passing out  . Sulfa Antibiotics Nausea And Vomiting  . Amoxicillin Rash    Past Medical History  Diagnosis Date  . Hypertension   . Asthma   . Dementia     mild  . Osteoarthritis   . Gallstones   . Chronic kidney disease     Stage 2 to 3  . Anemia   . Stroke     Remote R cerebellar stroke with gait abnormaility  . Atrial fibrillation     likely permanent  . Coronary artery disease   . Anticoagulant long-term use   . Diastolic dysfunction   . Valvular heart disease     Mitral and tricuspid insufficiency  . Tachy-brady syndrome   . Pacemaker May 2012    VVI per Dr. Johney Frame    Past Surgical History  Procedure Date  . Abdominal hysterectomy     total  . Appendectomy   . Hemorrhoid surgery   . Cataract extraction, bilateral   . Total knee arthroplasty   . Cholecystectomy   . Pacemaker insertion May 2012    VVI implant. Dr. Johney Frame    History  Smoking status  . Never Smoker   Smokeless tobacco  . Not on file    History  Alcohol Use No    Family History  Problem Relation Age of Onset  . Clotting disorder Mother   . Suicidality Father     Review of Systems: The review of  systems is as above.  All other systems were reviewed and are negative.  Physical Exam: BP 110/78  Pulse 125  Wt 154 lb (69.854 kg) Patient is very pleasant and in no acute distress. She looks younger than her stated age. Skin is warm and dry. Color is normal.  HEENT is unremarkable. Normocephalic/atraumatic. PERRL. Sclera are nonicteric. Neck is supple. No masses. No JVD. Lungs are clear. Cardiac exam shows an irregular rate and rhythm. Her rate does speed up and slow down on exam. Abdomen is soft. Extremities are without edema. Gait and ROM are intact. No gross neurologic deficits noted.  LABORATORY DATA: EKG shows atrial fib. Her rate varies from 60's to 125.    Assessment / Plan:

## 2010-12-24 NOTE — Assessment & Plan Note (Signed)
Rate is still not as well controlled. She is somewhat symptomatic. I have increased her Lanoxin back to the 0.25 mg. This is her old dose that she took prior to her PTVP. She has an appointment to see Dr. Swaziland on the 10th of July. She will keep that appointment. She will continue to monitor her blood pressure and heart rate. Patient is agreeable to this plan and will call if any problems develop in the interim.

## 2010-12-25 ENCOUNTER — Encounter: Payer: Self-pay | Admitting: Nurse Practitioner

## 2011-01-08 ENCOUNTER — Encounter: Payer: Self-pay | Admitting: Cardiology

## 2011-01-15 ENCOUNTER — Ambulatory Visit (INDEPENDENT_AMBULATORY_CARE_PROVIDER_SITE_OTHER): Payer: Medicare Other | Admitting: Cardiology

## 2011-01-15 ENCOUNTER — Encounter: Payer: Self-pay | Admitting: Cardiology

## 2011-01-15 VITALS — BP 126/70 | HR 78 | Ht 66.0 in | Wt 154.2 lb

## 2011-01-15 DIAGNOSIS — I4891 Unspecified atrial fibrillation: Secondary | ICD-10-CM

## 2011-01-15 DIAGNOSIS — N189 Chronic kidney disease, unspecified: Secondary | ICD-10-CM

## 2011-01-15 LAB — BASIC METABOLIC PANEL
BUN: 29 mg/dL — ABNORMAL HIGH (ref 6–23)
GFR: 34.59 mL/min — ABNORMAL LOW (ref >60.00)
Potassium: 4 mEq/L (ref 3.5–5.1)

## 2011-01-15 NOTE — Patient Instructions (Signed)
We will check your potassium and digoxin level today.  Continue with your current medications.  I will see you again in 3 months.

## 2011-01-16 LAB — DIGOXIN LEVEL: Digoxin Level: 1 ng/mL (ref 0.8–2.0)

## 2011-01-17 ENCOUNTER — Telehealth: Payer: Self-pay | Admitting: *Deleted

## 2011-01-17 NOTE — Assessment & Plan Note (Signed)
Her creatinine is at baseline. Her potassium levels were normal.

## 2011-01-17 NOTE — Telephone Encounter (Signed)
Message copied by Lorayne Bender on Thu Jan 17, 2011  1:37 PM ------      Message from: Sw[place], PETER M      Created: Tue Jan 15, 2011  3:13 PM       Renal function ok. Potassium is normal. Dig level pending. Can try and stop potassium supplement and eat foods rich in K+.

## 2011-01-17 NOTE — Assessment & Plan Note (Addendum)
Her heart rate currently appears to be well controlled with a combination of metoprolol, diltiazem, and digoxin. Her digoxin level is satisfactory. She is on Coumadin for anticoagulation. I will followup again in 3 months.

## 2011-01-17 NOTE — Telephone Encounter (Signed)
Lm w/lab results. Advised may stop K pills and use K foods. Advised to call back if has questions.

## 2011-01-17 NOTE — Progress Notes (Signed)
Susan [name] Date of Birth: 06-20-1921   History of Present Illness: Susan [name] is seen back today for followup. She states she is doing better. She's had no significant lightheadedness, dizziness, or chest pain. She denies any palpitations. She does note that she is short of breath with activity or bending over. She complains that she doesn't have as much energy. Her cough has resolved.  Current Outpatient Prescriptions on File Prior to Visit  Medication Sig Dispense Refill  . aspirin 81 MG tablet Take 81 mg by mouth daily.        . Calcium Carbonate (CALTRATE 600 PO) Take by mouth 2 (two) times daily.       . Cholecalciferol (VITAMIN D-3 PO) Take 2,000 mg by mouth daily.       . Chromium Picolinate 200 MCG CAPS Take by mouth.        . diclofenac sodium (VOLTAREN) 1 % GEL Apply topically as needed.       . digoxin (LANOXIN) 0.125 MG tablet Take 2 tablets (250 mcg total) by mouth daily.      Marland Kitchen diltiazem (CARDIZEM CD) 120 MG 24 hr capsule Take 1 capsule (120 mg total) by mouth daily.  30 capsule  11  . donepezil (ARICEPT) 10 MG tablet Take 10 mg by mouth at bedtime.        . fish oil-omega-3 fatty acids 1000 MG capsule Take 2 g by mouth daily.        . fluticasone (FLONASE) 50 MCG/ACT nasal spray 2 sprays by Nasal route 2 (two) times daily as needed.        . Fluticasone-Salmeterol (ADVAIR DISKUS) 250-50 MCG/DOSE AEPB Inhale 1 puff into the lungs every 12 (twelve) hours.        . furosemide (LASIX) 40 MG tablet Take 20 mg by mouth 2 (two) times daily.       . Garlic 100 MG TABS Take by mouth.        . Glucosamine & Fish Oil 500-400-60-40 MG CAPS Take by mouth 2 (two) times daily.        . metoprolol (TOPROL-XL) 50 MG 24 hr tablet Take 100 mg by mouth daily.       . montelukast (SINGULAIR) 10 MG tablet Take 10 mg by mouth at bedtime.        . Multiple Vitamins-Minerals (OCUVITE ADULT 50+ PO) Take by mouth daily.        . multivitamin (THERAGRAN) per tablet Take 1 tablet by mouth daily.         . potassium chloride SA (K-DUR,KLOR-CON) 20 MEQ tablet Take 1 tablet (20 mEq total) by mouth daily.  30 tablet  6  . warfarin (COUMADIN) 5 MG tablet Take 5 mg by mouth as directed.        . docusate sodium (COLACE) 100 MG capsule Take 100 mg by mouth as needed.        Marland Kitchen HYDROcodone-acetaminophen (NORCO) 10-325 MG per tablet Take 1 tablet by mouth every 6 (six) hours as needed.         Allergies  Allergen Reactions  . Prednisone     Long term has reaction; felt like passing out  . Sulfa Antibiotics Nausea And Vomiting  . Amoxicillin Rash    Past Medical History  Diagnosis Date  . Hypertension   . Asthma   . Dementia     mild  . Osteoarthritis   . Gallstones   . Chronic kidney disease  Stage 2 to 3  . Anemia   . Stroke     Remote R cerebellar stroke with gait abnormaility  . Atrial fibrillation     likely permanent  . Coronary artery disease   . Anticoagulant long-term use   . Diastolic dysfunction   . Valvular heart disease     Mitral and tricuspid insufficiency  . Tachy-brady syndrome   . Pacemaker May 2012    VVI per Dr. Johney Frame    Past Surgical History  Procedure Date  . Abdominal hysterectomy     total  . Appendectomy   . Hemorrhoid surgery   . Cataract extraction, bilateral   . Total knee arthroplasty   . Cholecystectomy   . Pacemaker insertion May 2012    VVI implant. Dr. Johney Frame    History  Smoking status  . Never Smoker   Smokeless tobacco  . Not on file    History  Alcohol Use No    Family History  Problem Relation Age of Onset  . Clotting disorder Mother   . Suicidality Father     Review of Systems: The review of systems is as above.  All other systems were reviewed and are negative.  Physical Exam: BP 126/70  Pulse 78  Ht 5\' 6"  (1.676 m)  Wt 154 lb 3.2 oz (69.945 kg)  BMI 24.89 kg/m2 Patient is very pleasant and in no acute distress. She looks younger than her stated age. Skin is warm and dry. Color is normal.  HEENT is  unremarkable. Normocephalic/atraumatic. PERRL. Sclera are nonicteric. Neck is supple. No masses. No JVD. Lungs are clear. Cardiac exam shows an irregular rate and rhythm.  Abdomen is soft. Extremities are without edema. Gait and ROM are intact. No gross neurologic deficits noted.  LABORATORY DATA:  Digoxin level is 1.0. Basic metabolic panel is stable with a creatinine of 1.5.   Assessment / Plan:

## 2011-04-03 ENCOUNTER — Encounter: Payer: Self-pay | Admitting: Internal Medicine

## 2011-04-03 ENCOUNTER — Ambulatory Visit (INDEPENDENT_AMBULATORY_CARE_PROVIDER_SITE_OTHER): Payer: Medicare Other | Admitting: Internal Medicine

## 2011-04-03 DIAGNOSIS — I509 Heart failure, unspecified: Secondary | ICD-10-CM

## 2011-04-03 DIAGNOSIS — I495 Sick sinus syndrome: Secondary | ICD-10-CM

## 2011-04-03 DIAGNOSIS — I4891 Unspecified atrial fibrillation: Secondary | ICD-10-CM

## 2011-04-03 DIAGNOSIS — I5032 Chronic diastolic (congestive) heart failure: Secondary | ICD-10-CM

## 2011-04-03 LAB — PACEMAKER DEVICE OBSERVATION
DEVICE MODEL PM: 7222573
RV LEAD THRESHOLD: 0.75 V
VENTRICULAR PACING PM: 16

## 2011-04-03 NOTE — Assessment & Plan Note (Signed)
Weight today is below baseline Daily weights advised,  Take additional lasix 40mg  if weight increases by more than 2 lbs Salt restriction advised  Follow-up with Dr Swaziland

## 2011-04-03 NOTE — Progress Notes (Signed)
The patient presents today for routine electrophysiology followup.  Since last being seen in our clinic, the patient reports doing reasonably well.  She reports occasional SOB, but feels stable presently.  Today, she denies symptoms of palpitations, chest pain,  orthopnea, PND, lower extremity edema, dizziness, presyncope, syncope, or neurologic sequela.  The patient feels that she is tolerating medications without difficulties and is otherwise without complaint today.   Past Medical History  Diagnosis Date  . Hypertension   . Asthma   . Dementia     mild  . Osteoarthritis   . Gallstones   . Chronic kidney disease     Stage 2 to 3  . Anemia   . Stroke     Remote R cerebellar stroke with gait abnormaility  . Atrial fibrillation     permanent  . Coronary artery disease   . Anticoagulant long-term use   . Diastolic dysfunction   . Valvular heart disease     Mitral and tricuspid insufficiency  . Tachy-brady syndrome   . Pacemaker May 2012    VVI per Dr. Johney Frame   Past Surgical History  Procedure Date  . Abdominal hysterectomy     total  . Appendectomy   . Hemorrhoid surgery   . Cataract extraction, bilateral   . Total knee arthroplasty   . Cholecystectomy   . Pacemaker insertion May 2012    VVI implant. Dr. Johney Frame    Current Outpatient Prescriptions  Medication Sig Dispense Refill  . aspirin 81 MG tablet Take 81 mg by mouth daily.        . Calcium Carbonate (CALTRATE 600 PO) Take by mouth 2 (two) times daily.       . Cholecalciferol (VITAMIN D-3 PO) Take 2,000 mg by mouth daily.       . Chromium Picolinate 200 MCG CAPS Take by mouth.        . diclofenac sodium (VOLTAREN) 1 % GEL Apply topically as needed.       . digoxin (LANOXIN) 0.125 MG tablet Take 2 tablets (250 mcg total) by mouth daily.      Marland Kitchen diltiazem (CARDIZEM CD) 120 MG 24 hr capsule Take 1 capsule (120 mg total) by mouth daily.  30 capsule  11  . docusate sodium (COLACE) 100 MG capsule Take 100 mg by mouth as  needed.        . donepezil (ARICEPT) 10 MG tablet Take 10 mg by mouth at bedtime.        . fish oil-omega-3 fatty acids 1000 MG capsule Take 2 g by mouth daily.        . fluticasone (FLONASE) 50 MCG/ACT nasal spray 2 sprays by Nasal route 2 (two) times daily as needed.        . Fluticasone-Salmeterol (ADVAIR DISKUS) 250-50 MCG/DOSE AEPB Inhale 1 puff into the lungs every 12 (twelve) hours.        . furosemide (LASIX) 40 MG tablet Take 20 mg by mouth 2 (two) times daily.       . Garlic 100 MG TABS Take by mouth.        . Glucosamine & Fish Oil 500-400-60-40 MG CAPS Take by mouth 2 (two) times daily.        Marland Kitchen HYDROcodone-acetaminophen (NORCO) 10-325 MG per tablet Take 1 tablet by mouth every 6 (six) hours as needed.       . metoprolol (TOPROL-XL) 50 MG 24 hr tablet Take 100 mg by mouth daily.       Marland Kitchen  montelukast (SINGULAIR) 10 MG tablet Take 10 mg by mouth at bedtime.        . Multiple Vitamins-Minerals (OCUVITE ADULT 50+ PO) Take by mouth daily.        . multivitamin (THERAGRAN) per tablet Take 1 tablet by mouth daily.        Marland Kitchen warfarin (COUMADIN) 5 MG tablet Take 5 mg by mouth as directed.          Allergies  Allergen Reactions  . Prednisone     Long term has reaction; felt like passing out  . Sulfa Antibiotics Nausea And Vomiting  . Amoxicillin Rash    History   Social History  . Marital Status: Widowed    Spouse Name: N/A    Number of Children: N/A  . Years of Education: N/A   Occupational History  . Not on file.   Social History Main Topics  . Smoking status: Never Smoker   . Smokeless tobacco: Not on file  . Alcohol Use: No  . Drug Use: No  . Sexually Active: Not on file   Other Topics Concern  . Not on file   Social History Narrative   Pt lives in [place] with daughter.Retired from an Scientist, forensic.    Family History  Problem Relation Age of Onset  . Clotting disorder Mother   . Suicidality Father     ROS-  All systems are reviewed and are negative  except as outlined in the HPI above   Physical Exam: Filed Vitals:   04/03/11 1137  BP: 117/59  Pulse: 58  Height: 5\' 6"  (1.676 m)  Weight: 147 lb (66.679 kg)    GEN- The patient is well appearing, alert and oriented x 3 today.   Head- normocephalic, atraumatic Eyes-  Sclera clear, conjunctiva pink Ears- hearing intact Oropharynx- clear Neck- supple, no JVP Lymph- no cervical lymphadenopathy Lungs- Clear to ausculation bilaterally, normal work of breathing Chest- pacemaker pocket is well healed Heart- irregular rate and rhythm, no murmurs, rubs or gallops, PMI not laterally displaced GI- soft, NT, ND, + BS Extremities- no clubbing, cyanosis, or edema  Pacemaker interrogation- reviewed in detail today,  See PACEART report  Assessment and Plan:

## 2011-04-03 NOTE — Patient Instructions (Signed)
Your physician wants you to follow-up in: May 2013 You will receive a reminder letter in the mail two months in advance. If you don't receive a letter, please call our office to schedule the follow-up appointment.  Your physician has recommended you make the following change in your medication:  1) If your weight goes up by more than 2 pounds in a day  Take an additional Furosemide 40mg  at 2pm  2) follow a 2 gram Sodium diet

## 2011-04-03 NOTE — Assessment & Plan Note (Signed)
Permanent afib Continue coumadin and rate control

## 2011-04-03 NOTE — Assessment & Plan Note (Signed)
Normal pacemaker function See Pace Art report No changes today  Pacemaker suggests reasonable heart rate control

## 2011-04-11 LAB — CBC
HCT: 35.3 % — ABNORMAL LOW (ref 36.0–46.0)
HCT: 35.8 % — ABNORMAL LOW (ref 36.0–46.0)
HCT: 36.1 % (ref 36.0–46.0)
Hemoglobin: 11.6 g/dL — ABNORMAL LOW (ref 12.0–15.0)
Hemoglobin: 11.7 g/dL — ABNORMAL LOW (ref 12.0–15.0)
MCHC: 32.7 g/dL (ref 30.0–36.0)
MCHC: 33.1 g/dL (ref 30.0–36.0)
MCV: 87.9 fL (ref 78.0–100.0)
MCV: 88.6 fL (ref 78.0–100.0)
MCV: 88.7 fL (ref 78.0–100.0)
Platelets: 245 10*3/uL (ref 150–400)
Platelets: 246 10*3/uL (ref 150–400)
Platelets: 279 10*3/uL (ref 150–400)
Platelets: 291 10*3/uL (ref 150–400)
RBC: 3.68 MIL/uL — ABNORMAL LOW (ref 3.87–5.11)
RBC: 4.03 MIL/uL (ref 3.87–5.11)
RDW: 15.5 % (ref 11.5–15.5)
WBC: 7.7 10*3/uL (ref 4.0–10.5)
WBC: 8.2 10*3/uL (ref 4.0–10.5)
WBC: 8.9 10*3/uL (ref 4.0–10.5)
WBC: 9.2 10*3/uL (ref 4.0–10.5)

## 2011-04-11 LAB — POCT CARDIAC MARKERS
CKMB, poc: 1.4 ng/mL (ref 1.0–8.0)
CKMB, poc: <1 ng/mL — ABNORMAL LOW (ref 1.0–8.0)
Troponin i, poc: <0.05 ng/mL (ref 0.00–0.09)

## 2011-04-11 LAB — URINALYSIS, ROUTINE W REFLEX MICROSCOPIC
Hgb urine dipstick: NEGATIVE
Nitrite: NEGATIVE
Protein, ur: 30 mg/dL — AB
Specific Gravity, Urine: 1.029 (ref 1.005–1.030)
Urobilinogen, UA: 0.2 mg/dL (ref 0.0–1.0)

## 2011-04-11 LAB — COMPREHENSIVE METABOLIC PANEL
ALT: 24 U/L (ref 0–35)
AST: 21 U/L (ref 0–37)
AST: 23 U/L (ref 0–37)
AST: 24 U/L (ref 0–37)
Albumin: 2.8 g/dL — ABNORMAL LOW (ref 3.5–5.2)
Albumin: 3 g/dL — ABNORMAL LOW (ref 3.5–5.2)
Albumin: 3.1 g/dL — ABNORMAL LOW (ref 3.5–5.2)
Albumin: 3.3 g/dL — ABNORMAL LOW (ref 3.5–5.2)
Alkaline Phosphatase: 45 U/L (ref 39–117)
Alkaline Phosphatase: 52 U/L (ref 39–117)
BUN: 14 mg/dL (ref 6–23)
BUN: 18 mg/dL (ref 6–23)
BUN: 26 mg/dL — ABNORMAL HIGH (ref 6–23)
CO2: 24 mEq/L (ref 19–32)
CO2: 28 mEq/L (ref 19–32)
CO2: 30 mEq/L (ref 19–32)
Calcium: 8.8 mg/dL (ref 8.4–10.5)
Calcium: 9.2 mg/dL (ref 8.4–10.5)
Chloride: 105 mEq/L (ref 96–112)
Chloride: 106 mEq/L (ref 96–112)
Chloride: 106 mEq/L (ref 96–112)
Chloride: 107 mEq/L (ref 96–112)
Creatinine, Ser: 1.21 mg/dL — ABNORMAL HIGH (ref 0.4–1.2)
Creatinine, Ser: 1.48 mg/dL — ABNORMAL HIGH (ref 0.4–1.2)
Creatinine, Ser: 1.69 mg/dL — ABNORMAL HIGH (ref 0.4–1.2)
GFR calc Af Amer: 41 mL/min — ABNORMAL LOW (ref >60)
GFR calc Af Amer: 48 mL/min — ABNORMAL LOW (ref >60)
GFR calc Af Amer: 51 mL/min — ABNORMAL LOW (ref >60)
GFR calc non Af Amer: 29 mL/min — ABNORMAL LOW (ref >60)
GFR calc non Af Amer: 40 mL/min — ABNORMAL LOW (ref >60)
GFR calc non Af Amer: 42 mL/min — ABNORMAL LOW (ref >60)
Glucose, Bld: 127 mg/dL — ABNORMAL HIGH (ref 70–99)
Glucose, Bld: 88 mg/dL (ref 70–99)
Glucose, Bld: 99 mg/dL (ref 70–99)
Potassium: 3.7 mEq/L (ref 3.5–5.1)
Potassium: 3.7 mEq/L (ref 3.5–5.1)
Potassium: 3.7 mEq/L (ref 3.5–5.1)
Potassium: 4 mEq/L (ref 3.5–5.1)
Sodium: 142 mEq/L (ref 135–145)
Total Bilirubin: 0.6 mg/dL (ref 0.3–1.2)
Total Bilirubin: 0.6 mg/dL (ref 0.3–1.2)
Total Bilirubin: 0.7 mg/dL (ref 0.3–1.2)
Total Bilirubin: 0.8 mg/dL (ref 0.3–1.2)
Total Protein: 5.5 g/dL — ABNORMAL LOW (ref 6.0–8.3)

## 2011-04-11 LAB — URINE CULTURE

## 2011-04-11 LAB — CARDIAC PANEL(CRET KIN+CKTOT+MB+TROPI)
CK, MB: 2.5 ng/mL (ref 0.3–4.0)
Relative Index: INVALID (ref 0.0–2.5)
Troponin I: 0.01 ng/mL (ref 0.00–0.06)
Troponin I: <0.01 ng/mL (ref 0.00–0.06)

## 2011-04-11 LAB — DIFFERENTIAL
Basophils Absolute: 0 10*3/uL (ref 0.0–0.1)
Basophils Absolute: 0 10*3/uL (ref 0.0–0.1)
Basophils Absolute: 0.1 10*3/uL (ref 0.0–0.1)
Basophils Relative: 0 % (ref 0–1)
Basophils Relative: 0 % (ref 0–1)
Basophils Relative: 1 % (ref 0–1)
Eosinophils Absolute: 0.3 10*3/uL (ref 0.0–0.7)
Eosinophils Relative: 3 % (ref 0–5)
Eosinophils Relative: 4 % (ref 0–5)
Lymphocytes Relative: 25 % (ref 12–46)
Lymphocytes Relative: 31 % (ref 12–46)
Lymphocytes Relative: 32 % (ref 12–46)
Lymphs Abs: 1.7 10*3/uL (ref 0.7–4.0)
Lymphs Abs: 2 10*3/uL (ref 0.7–4.0)
Lymphs Abs: 2.3 10*3/uL (ref 0.7–4.0)
Monocytes Absolute: 0.6 10*3/uL (ref 0.1–1.0)
Monocytes Absolute: 0.7 10*3/uL (ref 0.1–1.0)
Monocytes Absolute: 0.8 10*3/uL (ref 0.1–1.0)
Monocytes Absolute: 0.8 10*3/uL (ref 0.1–1.0)
Monocytes Relative: 11 % (ref 3–12)
Monocytes Relative: 7 % (ref 3–12)
Neutro Abs: 4 10*3/uL (ref 1.7–7.7)
Neutro Abs: 5.2 10*3/uL (ref 1.7–7.7)
Neutro Abs: 6.5 10*3/uL (ref 1.7–7.7)
Neutrophils Relative %: 54 % (ref 43–77)
Neutrophils Relative %: 56 % (ref 43–77)
Neutrophils Relative %: 60 % (ref 43–77)

## 2011-04-11 LAB — URINE MICROSCOPIC-ADD ON

## 2011-04-11 LAB — PHOSPHORUS: Phosphorus: 4.6 mg/dL (ref 2.3–4.6)

## 2011-04-11 LAB — B-NATRIURETIC PEPTIDE (CONVERTED LAB)
Pro B Natriuretic peptide (BNP): 159 pg/mL — ABNORMAL HIGH (ref 0.0–100.0)
Pro B Natriuretic peptide (BNP): 159 pg/mL — ABNORMAL HIGH (ref 0.0–100.0)
Pro B Natriuretic peptide (BNP): 364 pg/mL — ABNORMAL HIGH (ref 0.0–100.0)
Pro B Natriuretic peptide (BNP): 468 pg/mL — ABNORMAL HIGH (ref 0.0–100.0)
Pro B Natriuretic peptide (BNP): 473 pg/mL — ABNORMAL HIGH (ref 0.0–100.0)

## 2011-04-11 LAB — FOLATE RBC: RBC Folate: 1787 ng/mL — ABNORMAL HIGH (ref 180–600)

## 2011-04-11 LAB — LIPID PANEL
HDL: 65 mg/dL (ref >39)
Total CHOL/HDL Ratio: 2 RATIO
VLDL: 10 mg/dL (ref 0–40)

## 2011-04-11 LAB — TSH: TSH: 1.459 u[IU]/mL (ref 0.350–4.500)

## 2011-04-11 LAB — LIPASE, BLOOD: Lipase: 66 U/L — ABNORMAL HIGH (ref 11–59)

## 2011-04-11 LAB — VITAMIN B12: Vitamin B-12: 411 pg/mL (ref 211–911)

## 2011-05-14 ENCOUNTER — Encounter: Payer: Self-pay | Admitting: Cardiology

## 2011-05-14 ENCOUNTER — Ambulatory Visit (INDEPENDENT_AMBULATORY_CARE_PROVIDER_SITE_OTHER): Payer: Medicare Other | Admitting: Cardiology

## 2011-05-14 VITALS — BP 110/58 | HR 78 | Ht 67.0 in | Wt 154.8 lb

## 2011-05-14 DIAGNOSIS — I059 Rheumatic mitral valve disease, unspecified: Secondary | ICD-10-CM

## 2011-05-14 DIAGNOSIS — I5032 Chronic diastolic (congestive) heart failure: Secondary | ICD-10-CM

## 2011-05-14 DIAGNOSIS — I4891 Unspecified atrial fibrillation: Secondary | ICD-10-CM

## 2011-05-14 DIAGNOSIS — I10 Essential (primary) hypertension: Secondary | ICD-10-CM

## 2011-05-14 DIAGNOSIS — I509 Heart failure, unspecified: Secondary | ICD-10-CM

## 2011-05-14 DIAGNOSIS — I340 Nonrheumatic mitral (valve) insufficiency: Secondary | ICD-10-CM

## 2011-05-14 NOTE — Assessment & Plan Note (Signed)
I do think that her dyspnea is related to diastolic dysfunction with atrial fibrillation. She does not appear to be volume overloaded today. She is instructed to take an extra Lasix if her weight increases or she has swelling. She knows to avoid salt intake. I will plan a followup again in 4 months.

## 2011-05-14 NOTE — Progress Notes (Signed)
[name] Date of Birth: 13-Jun-1921   History of Present Illness: Susan [name] is seen back today for followup. She still has good days and bad days. Sometimes she can't walk any distance without getting short of breath and getting a weird lightheaded feeling. She has had no significant syncope. Her blood pressure has been doing well. Her last pacemaker check was satisfactory and it appears that her heart rate is well controlled now. Denies any chest pain. She has had no swelling or orthopnea. Her weight has been stable.  Current Outpatient Prescriptions on File Prior to Visit  Medication Sig Dispense Refill  . aspirin 81 MG tablet Take 81 mg by mouth daily.        . Calcium Carbonate (CALTRATE 600 PO) Take by mouth 2 (two) times daily.       . Cholecalciferol (VITAMIN D-3 PO) Take 2,000 mg by mouth daily.       . Chromium Picolinate 200 MCG CAPS Take by mouth.        . digoxin (LANOXIN) 0.125 MG tablet Take 2 tablets (250 mcg total) by mouth daily.      Marland Kitchen diltiazem (CARDIZEM CD) 120 MG 24 hr capsule Take 1 capsule (120 mg total) by mouth daily.  30 capsule  11  . docusate sodium (COLACE) 100 MG capsule Take 100 mg by mouth as needed.        . donepezil (ARICEPT) 10 MG tablet Take 10 mg by mouth at bedtime.        . fish oil-omega-3 fatty acids 1000 MG capsule Take 2 g by mouth daily.        . fluticasone (FLONASE) 50 MCG/ACT nasal spray 2 sprays by Nasal route 2 (two) times daily as needed.        . Fluticasone-Salmeterol (ADVAIR DISKUS) 250-50 MCG/DOSE AEPB Inhale 1 puff into the lungs every 12 (twelve) hours.        . furosemide (LASIX) 40 MG tablet Take 20 mg by mouth 2 (two) times daily.       . Garlic 100 MG TABS Take by mouth.        . Glucosamine & Fish Oil 500-400-60-40 MG CAPS Take by mouth 2 (two) times daily.        Marland Kitchen HYDROcodone-acetaminophen (NORCO) 10-325 MG per tablet Take 1 tablet by mouth every 6 (six) hours as needed.       . metoprolol (TOPROL-XL) 50 MG 24 hr tablet  Take 100 mg by mouth daily.       . montelukast (SINGULAIR) 10 MG tablet Take 10 mg by mouth at bedtime.        . Multiple Vitamins-Minerals (OCUVITE ADULT 50+ PO) Take by mouth daily.        . multivitamin (THERAGRAN) per tablet Take 1 tablet by mouth daily.        Marland Kitchen warfarin (COUMADIN) 5 MG tablet Take 5 mg by mouth as directed.        . diclofenac sodium (VOLTAREN) 1 % GEL Apply topically as needed.         Allergies  Allergen Reactions  . Prednisone     Long term has reaction; felt like passing out  . Sulfa Antibiotics Nausea And Vomiting  . Amoxicillin Rash    Past Medical History  Diagnosis Date  . Hypertension   . Asthma   . Dementia     mild  . Osteoarthritis   . Gallstones   . Chronic kidney disease  Stage 2 to 3  . Anemia   . Stroke     Remote R cerebellar stroke with gait abnormaility  . Atrial fibrillation     permanent  . Coronary artery disease   . Anticoagulant long-term use   . Diastolic dysfunction   . Valvular heart disease     Mitral and tricuspid insufficiency  . Tachy-brady syndrome   . Pacemaker May 2012    VVI per Dr. Johney Frame    Past Surgical History  Procedure Date  . Abdominal hysterectomy     total  . Appendectomy   . Hemorrhoid surgery   . Cataract extraction, bilateral   . Total knee arthroplasty   . Cholecystectomy   . Pacemaker insertion May 2012    VVI implant. Dr. Johney Frame    History  Smoking status  . Never Smoker   Smokeless tobacco  . Not on file    History  Alcohol Use No    Family History  Problem Relation Age of Onset  . Clotting disorder Mother   . Suicidality Father     Review of Systems: The review of systems is as above.  All other systems were reviewed and are negative.  Physical Exam: BP 110/58  Pulse 78  Ht 5\' 7"  (1.702 m)  Wt 154 lb 12.8 oz (70.217 kg)  BMI 24.25 kg/m2 Patient is very pleasant and in no acute distress. She looks younger than her stated age. Skin is warm and dry. Color is  normal.  HEENT is unremarkable. Normocephalic/atraumatic. PERRL. Sclera are nonicteric. Neck is supple. No masses. No JVD. Lungs are clear. Cardiac exam shows an irregular rate and rhythm.  Abdomen is soft. Extremities are without edema. Gait and ROM are intact. No gross neurologic deficits noted.  LABORATORY DATA: ECG demonstrates atrial fibrillation with a rate of 78 beats per minute. There were occasional paced beats. There is nonspecific ST-T wave abnormality.   Assessment / Plan:

## 2011-05-14 NOTE — Patient Instructions (Signed)
Continue your current medications.  Take extra Lasix for swelling or weight gain.  I will see you again in 4 months.

## 2011-05-14 NOTE — Assessment & Plan Note (Signed)
Her rate is well-controlled. She is on chronic anticoagulation. She has been in atrial fibrillation for over one year. She has moderate mitral insufficiency and at least moderate left atrial enlargement. I think this would make it difficult to maintain sinus rhythm even with antiarrhythmic drug therapy. We will continue with rate control and anticoagulation.

## 2011-05-14 NOTE — Assessment & Plan Note (Signed)
Blood pressure control is good. 

## 2011-07-12 ENCOUNTER — Other Ambulatory Visit: Payer: Self-pay | Admitting: Cardiology

## 2011-07-12 NOTE — Telephone Encounter (Signed)
Fax Received. Refill Completed. [facility] [place] (R.M.A)   

## 2011-07-19 DIAGNOSIS — R5383 Other fatigue: Secondary | ICD-10-CM | POA: Diagnosis not present

## 2011-07-19 DIAGNOSIS — D539 Nutritional anemia, unspecified: Secondary | ICD-10-CM | POA: Diagnosis not present

## 2011-07-19 DIAGNOSIS — N182 Chronic kidney disease, stage 2 (mild): Secondary | ICD-10-CM | POA: Diagnosis not present

## 2011-07-19 DIAGNOSIS — R5381 Other malaise: Secondary | ICD-10-CM | POA: Diagnosis not present

## 2011-07-19 DIAGNOSIS — I4891 Unspecified atrial fibrillation: Secondary | ICD-10-CM | POA: Diagnosis not present

## 2011-07-19 DIAGNOSIS — I6789 Other cerebrovascular disease: Secondary | ICD-10-CM | POA: Diagnosis not present

## 2011-07-19 DIAGNOSIS — R0602 Shortness of breath: Secondary | ICD-10-CM | POA: Diagnosis not present

## 2011-07-19 DIAGNOSIS — I519 Heart disease, unspecified: Secondary | ICD-10-CM | POA: Diagnosis not present

## 2011-07-19 DIAGNOSIS — Z7901 Long term (current) use of anticoagulants: Secondary | ICD-10-CM | POA: Diagnosis not present

## 2011-07-29 DIAGNOSIS — J301 Allergic rhinitis due to pollen: Secondary | ICD-10-CM | POA: Diagnosis not present

## 2011-07-29 DIAGNOSIS — R5381 Other malaise: Secondary | ICD-10-CM | POA: Diagnosis not present

## 2011-07-29 DIAGNOSIS — I4891 Unspecified atrial fibrillation: Secondary | ICD-10-CM | POA: Diagnosis not present

## 2011-07-29 DIAGNOSIS — R5383 Other fatigue: Secondary | ICD-10-CM | POA: Diagnosis not present

## 2011-08-19 DIAGNOSIS — R05 Cough: Secondary | ICD-10-CM | POA: Diagnosis not present

## 2011-08-19 DIAGNOSIS — J301 Allergic rhinitis due to pollen: Secondary | ICD-10-CM | POA: Diagnosis not present

## 2011-08-19 DIAGNOSIS — I4891 Unspecified atrial fibrillation: Secondary | ICD-10-CM | POA: Diagnosis not present

## 2011-08-19 DIAGNOSIS — Z7901 Long term (current) use of anticoagulants: Secondary | ICD-10-CM | POA: Diagnosis not present

## 2011-09-05 DIAGNOSIS — S99929A Unspecified injury of unspecified foot, initial encounter: Secondary | ICD-10-CM | POA: Diagnosis not present

## 2011-09-05 DIAGNOSIS — M79609 Pain in unspecified limb: Secondary | ICD-10-CM | POA: Diagnosis not present

## 2011-09-05 DIAGNOSIS — M2010 Hallux valgus (acquired), unspecified foot: Secondary | ICD-10-CM | POA: Diagnosis not present

## 2011-09-05 DIAGNOSIS — Z7901 Long term (current) use of anticoagulants: Secondary | ICD-10-CM | POA: Diagnosis not present

## 2011-09-05 DIAGNOSIS — S92919A Unspecified fracture of unspecified toe(s), initial encounter for closed fracture: Secondary | ICD-10-CM | POA: Diagnosis not present

## 2011-09-05 DIAGNOSIS — I509 Heart failure, unspecified: Secondary | ICD-10-CM | POA: Diagnosis not present

## 2011-09-06 DIAGNOSIS — S92919A Unspecified fracture of unspecified toe(s), initial encounter for closed fracture: Secondary | ICD-10-CM | POA: Diagnosis not present

## 2011-09-10 ENCOUNTER — Other Ambulatory Visit: Payer: Self-pay | Admitting: *Deleted

## 2011-09-10 MED ORDER — METOPROLOL SUCCINATE ER 100 MG PO TB24: 100.0000 mg | ORAL_TABLET | Freq: Every day | ORAL | Status: DC

## 2011-09-19 ENCOUNTER — Ambulatory Visit (INDEPENDENT_AMBULATORY_CARE_PROVIDER_SITE_OTHER): Payer: Medicare Other | Admitting: Cardiology

## 2011-09-19 ENCOUNTER — Encounter: Payer: Self-pay | Admitting: Cardiology

## 2011-09-19 VITALS — BP 100/58 | HR 80 | Ht 67.0 in | Wt 157.0 lb

## 2011-09-19 DIAGNOSIS — I4891 Unspecified atrial fibrillation: Secondary | ICD-10-CM

## 2011-09-19 DIAGNOSIS — I10 Essential (primary) hypertension: Secondary | ICD-10-CM | POA: Diagnosis not present

## 2011-09-19 DIAGNOSIS — I5030 Unspecified diastolic (congestive) heart failure: Secondary | ICD-10-CM

## 2011-09-19 DIAGNOSIS — I509 Heart failure, unspecified: Secondary | ICD-10-CM

## 2011-09-19 DIAGNOSIS — I495 Sick sinus syndrome: Secondary | ICD-10-CM | POA: Diagnosis not present

## 2011-09-19 DIAGNOSIS — I5032 Chronic diastolic (congestive) heart failure: Secondary | ICD-10-CM

## 2011-09-19 NOTE — Assessment & Plan Note (Signed)
Her rate is well controlled. She is on chronic anticoagulation with Coumadin. Continue her current management.

## 2011-09-19 NOTE — Patient Instructions (Signed)
Continue your current medication.  I will see you again in 6 months.   

## 2011-09-19 NOTE — Assessment & Plan Note (Signed)
Echocardiogram in 2011 showed normal left ventricular function. She had moderate mitral insufficiency. She is managing well with her current diuretic therapy. She knows to adjust as needed for increased weight or swelling.

## 2011-09-19 NOTE — Progress Notes (Signed)
[name] Date of Birth: [birth year]   History of Present Illness: Susan [name] is seen back today for followup. She states she is doing fairly well. She still gets short of breath if she has to walk very far. She has been managing her diuretic therapy. She takes Lasix once a day on a regular basis and will take an extra dose in the evening if needed for increased weight or swelling. She does complain of some imbalance in her gait. She feels lightheaded occasionally. She has had no further falls.  Current Outpatient Prescriptions on File Prior to Visit  Medication Sig Dispense Refill  . aspirin 81 MG tablet Take 81 mg by mouth daily.        . Calcium Carbonate (CALTRATE 600 PO) Take by mouth 2 (two) times daily.       . Cholecalciferol (VITAMIN D-3 PO) Take 2,000 mg by mouth daily.       . Chromium Picolinate 200 MCG CAPS Take by mouth.        . diclofenac sodium (VOLTAREN) 1 % GEL Apply topically as needed.       . digoxin (LANOXIN) 0.125 MG tablet Take 2 tablets (250 mcg total) by mouth daily.      Marland Kitchen docusate sodium (COLACE) 100 MG capsule Take 100 mg by mouth as needed.        . donepezil (ARICEPT) 10 MG tablet Take 10 mg by mouth at bedtime.        . fish oil-omega-3 fatty acids 1000 MG capsule Take 2 g by mouth daily.        . fluticasone (FLONASE) 50 MCG/ACT nasal spray 2 sprays by Nasal route 2 (two) times daily as needed.        . Fluticasone-Salmeterol (ADVAIR DISKUS) 250-50 MCG/DOSE AEPB Inhale 1 puff into the lungs every 12 (twelve) hours.        . furosemide (LASIX) 40 MG tablet Take 20 mg by mouth 2 (two) times daily.       . Garlic 100 MG TABS Take by mouth.        . Glucosamine & Fish Oil 500-400-60-40 MG CAPS Take by mouth 2 (two) times daily.        Marland Kitchen HYDROcodone-acetaminophen (NORCO) 10-325 MG per tablet Take 1 tablet by mouth every 6 (six) hours as needed.       . metoprolol succinate (TOPROL-XL) 100 MG 24 hr tablet Take 1 tablet (100 mg total) by mouth daily. Take with  or immediately following a meal.  90 tablet  2  . montelukast (SINGULAIR) 10 MG tablet Take 10 mg by mouth at bedtime.        . Multiple Vitamins-Minerals (OCUVITE ADULT 50+ PO) Take by mouth daily.        . multivitamin (THERAGRAN) per tablet Take 1 tablet by mouth daily.        Marland Kitchen warfarin (COUMADIN) 5 MG tablet Take 5 mg by mouth as directed.          Allergies  Allergen Reactions  . Prednisone     Long term has reaction; felt like passing out  . Sulfa Antibiotics Nausea And Vomiting  . Amoxicillin Rash    Past Medical History  Diagnosis Date  . Hypertension   . Asthma   . Dementia     mild  . Osteoarthritis   . Gallstones   . Chronic kidney disease     Stage 2 to 3  . Anemia   .  Stroke     Remote R cerebellar stroke with gait abnormaility  . Atrial fibrillation     permanent  . Coronary artery disease   . Anticoagulant long-term use   . Diastolic dysfunction   . Valvular heart disease     Mitral and tricuspid insufficiency  . Tachy-brady syndrome   . Pacemaker May 2012    VVI per Dr. Johney Frame    Past Surgical History  Procedure Date  . Abdominal hysterectomy     total  . Appendectomy   . Hemorrhoid surgery   . Cataract extraction, bilateral   . Total knee arthroplasty   . Cholecystectomy   . Pacemaker insertion May 2012    VVI implant. Dr. Johney Frame    History  Smoking status  . Never Smoker   Smokeless tobacco  . Not on file    History  Alcohol Use No    Family History  Problem Relation Age of Onset  . Clotting disorder Mother   . Suicidality Father     Review of Systems: The review of systems is as above.  All other systems were reviewed and are negative.  Physical Exam: BP 100/58  Pulse 80  Ht 5\' 7"  (1.702 m)  Wt 157 lb (71.215 kg)  BMI 24.59 kg/m2 Patient is very pleasant and in no acute distress. She looks younger than her stated age. Skin is warm and dry. Color is normal.  HEENT is unremarkable. Normocephalic/atraumatic. PERRL. Sclera  are nonicteric. Neck is supple. No masses. No JVD. Lungs are clear. Cardiac exam shows an irregular rate and rhythm.  Abdomen is soft. Extremities are without edema. Gait and ROM are intact. No gross neurologic deficits noted.  LABORATORY DATA:    Assessment / Plan:

## 2011-09-19 NOTE — Assessment & Plan Note (Signed)
She is status post pacemaker implant in May of 2012. She is scheduled for followup in the pacemaker clinic this coming Monday.

## 2011-09-27 DIAGNOSIS — H52209 Unspecified astigmatism, unspecified eye: Secondary | ICD-10-CM | POA: Diagnosis not present

## 2011-09-27 DIAGNOSIS — H4720 Unspecified optic atrophy: Secondary | ICD-10-CM | POA: Diagnosis not present

## 2011-09-27 DIAGNOSIS — Z961 Presence of intraocular lens: Secondary | ICD-10-CM | POA: Diagnosis not present

## 2011-09-27 DIAGNOSIS — H5200 Hypermetropia, unspecified eye: Secondary | ICD-10-CM | POA: Diagnosis not present

## 2011-10-01 DIAGNOSIS — S92919A Unspecified fracture of unspecified toe(s), initial encounter for closed fracture: Secondary | ICD-10-CM | POA: Diagnosis not present

## 2011-10-03 DIAGNOSIS — Z7901 Long term (current) use of anticoagulants: Secondary | ICD-10-CM | POA: Diagnosis not present

## 2011-10-03 DIAGNOSIS — I4891 Unspecified atrial fibrillation: Secondary | ICD-10-CM | POA: Diagnosis not present

## 2011-10-17 DIAGNOSIS — H47099 Other disorders of optic nerve, not elsewhere classified, unspecified eye: Secondary | ICD-10-CM | POA: Diagnosis not present

## 2011-10-28 DIAGNOSIS — S92919A Unspecified fracture of unspecified toe(s), initial encounter for closed fracture: Secondary | ICD-10-CM | POA: Diagnosis not present

## 2011-11-05 DIAGNOSIS — Z7901 Long term (current) use of anticoagulants: Secondary | ICD-10-CM | POA: Diagnosis not present

## 2011-11-05 DIAGNOSIS — I4891 Unspecified atrial fibrillation: Secondary | ICD-10-CM | POA: Diagnosis not present

## 2011-11-18 ENCOUNTER — Encounter: Payer: Self-pay | Admitting: Internal Medicine

## 2011-11-18 ENCOUNTER — Ambulatory Visit (INDEPENDENT_AMBULATORY_CARE_PROVIDER_SITE_OTHER): Payer: Medicare Other | Admitting: Internal Medicine

## 2011-11-18 VITALS — BP 130/54 | HR 61 | Resp 18 | Ht 66.0 in | Wt 157.8 lb

## 2011-11-18 DIAGNOSIS — I4891 Unspecified atrial fibrillation: Secondary | ICD-10-CM

## 2011-11-18 DIAGNOSIS — I495 Sick sinus syndrome: Secondary | ICD-10-CM

## 2011-11-18 LAB — PACEMAKER DEVICE OBSERVATION
DEVICE MODEL PM: 7222573
RV LEAD AMPLITUDE: 12 mv
RV LEAD IMPEDENCE PM: 575 Ohm
VENTRICULAR PACING PM: 15

## 2011-11-18 NOTE — Patient Instructions (Signed)
Your physician wants you to follow-up in: 12 months with Dr Jacquiline Doe will receive a reminder letter in the mail two months in advance. If you don't receive a letter, please call our office to schedule the follow-up appointment    Remote monitoring is used to monitor your Pacemaker of ICD from home. This monitoring reduces the number of office visits required to check your device to one time per year. It allows [place] to keep an eye on the functioning of your device to ensure it is working properly. You are scheduled for a device check from home on 02/20/12 You may send your transmission at any time that day. If you have a wireless device, the transmission will be sent automatically. After your physician reviews your transmission, you will receive a postcard with your next transmission date.       Your physician has recommended you make the following change in your medication:  1) Stop Aspirin

## 2011-11-18 NOTE — Progress Notes (Signed)
PCP: Hoyle Sauer, MD, MD Primary Cardiologist:  Dr [place] The patient presents today for routine electrophysiology followup.  Since last being seen in our clinic, the patient reports doing reasonably well.  She reports occasional SOB, but feels stable presently.  She remains very active for her age.  Today, she denies symptoms of palpitations, chest pain,  orthopnea, PND, lower extremity edema, dizziness, presyncope, syncope, or neurologic sequela.  The patient feels that she is tolerating medications without difficulties and is otherwise without complaint today.   Past Medical History  Diagnosis Date  . Hypertension   . Asthma   . Dementia     mild  . Osteoarthritis   . Gallstones   . Chronic kidney disease     Stage 2 to 3  . Anemia   . Stroke     Remote R cerebellar stroke with gait abnormaility  . Atrial fibrillation     permanent  . Coronary artery disease   . Anticoagulant long-term use   . Diastolic dysfunction   . Valvular heart disease     Mitral and tricuspid insufficiency  . Tachy-brady syndrome   . Pacemaker May 2012    VVI per Dr. Johney Frame   Past Surgical History  Procedure Date  . Abdominal hysterectomy     total  . Appendectomy   . Hemorrhoid surgery   . Cataract extraction, bilateral   . Total knee arthroplasty   . Cholecystectomy   . Pacemaker insertion May 2012    St Jude Accent SR RF implant. Dr. Johney Frame    Current Outpatient Prescriptions  Medication Sig Dispense Refill  . Calcium Carbonate (CALTRATE 600 PO) Take by mouth 2 (two) times daily.       . Cholecalciferol (VITAMIN D-3 PO) Take 2,000 mg by mouth daily.       . Chromium Picolinate 200 MCG CAPS Take by mouth.        . Coenzyme Q10 (CO Q 10) 100 MG CAPS Take by mouth daily.      . Cyanocobalamin (VITAMIN B-12 PO) Take by mouth daily.      . diclofenac sodium (VOLTAREN) 1 % GEL Apply topically as needed.       . digoxin (LANOXIN) 0.125 MG tablet Take 2 tablets (250 mcg total) by mouth  daily.      Marland Kitchen diltiazem (DILACOR XR) 120 MG 24 hr capsule Take 1 tablet by mouth Daily.      Marland Kitchen docusate sodium (COLACE) 100 MG capsule Take 100 mg by mouth as needed.        . donepezil (ARICEPT) 10 MG tablet Take 10 mg by mouth at bedtime.        . fish oil-omega-3 fatty acids 1000 MG capsule Take 2 g by mouth daily.        . fluticasone (FLONASE) 50 MCG/ACT nasal spray 2 sprays by Nasal route 2 (two) times daily as needed.        . Fluticasone-Salmeterol (ADVAIR DISKUS) 250-50 MCG/DOSE AEPB Inhale 1 puff into the lungs every 12 (twelve) hours.        . furosemide (LASIX) 40 MG tablet Take 20 mg by mouth 2 (two) times daily.       . Garlic 100 MG TABS Take by mouth.        . Glucosamine & Fish Oil 500-400-60-40 MG CAPS Take by mouth 2 (two) times daily.        Marland Kitchen HYDROcodone-acetaminophen (NORCO) 10-325 MG per tablet Take 1 tablet by mouth  every 6 (six) hours as needed.       . metoprolol succinate (TOPROL-XL) 100 MG 24 hr tablet Take 1 tablet (100 mg total) by mouth daily. Take with or immediately following a meal.  90 tablet  2  . montelukast (SINGULAIR) 10 MG tablet Take 10 mg by mouth at bedtime.        . Multiple Vitamins-Minerals (OCUVITE ADULT 50+ PO) Take by mouth daily.        . multivitamin (THERAGRAN) per tablet Take 1 tablet by mouth daily.        . Omeprazole (PRILOSEC PO) Take by mouth as needed.      . warfarin (COUMADIN) 5 MG tablet Take 5 mg by mouth as directed.        Marland Kitchen DISCONTD: Dexlansoprazole (DEXILANT PO) Take by mouth as needed.          Allergies  Allergen Reactions  . Prednisone     Long term has reaction; felt like passing out  . Sulfa Antibiotics Nausea And Vomiting  . Amoxicillin Rash    History   Social History  . Marital Status: Widowed    Spouse Name: N/A    Number of Children: N/A  . Years of Education: N/A   Occupational History  . Not on file.   Social History Main Topics  . Smoking status: Never Smoker   . Smokeless tobacco: Not on file  .  Alcohol Use: No  . Drug Use: No  . Sexually Active: Not on file   Other Topics Concern  . Not on file   Social History Narrative   Pt lives in [place] with daughter.  Attends [employer] from an [employer].    Family History  Problem Relation Age of Onset  . Clotting disorder Mother   . Suicidality Father     Physical Exam: Filed Vitals:   11/18/11 1225  BP: 130/54  Pulse: 61  Resp: 18  Height: 5\' 6"  (1.676 m)  Weight: 157 lb 12.8 oz (71.578 kg)    GEN- The patient is well appearing, alert and oriented x 3 today.   Head- normocephalic, atraumatic Eyes-  Sclera clear, conjunctiva pink Ears- hearing intact Oropharynx- clear Neck- supple, no JVP Lymph- no cervical lymphadenopathy Lungs- Clear to ausculation bilaterally, normal work of breathing Chest- pacemaker pocket is well healed Heart- irregular rate and rhythm, no murmurs, rubs or gallops, PMI not laterally displaced GI- soft, NT, ND, + BS Extremities- no clubbing, cyanosis, or edema  Pacemaker interrogation- reviewed in detail today,  See PACEART report  Assessment and Plan:

## 2011-11-18 NOTE — Assessment & Plan Note (Signed)
Normal pacemaker function See Pace Art report No changes today  

## 2011-11-18 NOTE — Assessment & Plan Note (Signed)
Permanent afib Continue coumadin and rate control 

## 2011-11-27 DIAGNOSIS — I6789 Other cerebrovascular disease: Secondary | ICD-10-CM | POA: Diagnosis not present

## 2011-11-27 DIAGNOSIS — Z7901 Long term (current) use of anticoagulants: Secondary | ICD-10-CM | POA: Diagnosis not present

## 2011-11-27 DIAGNOSIS — I4891 Unspecified atrial fibrillation: Secondary | ICD-10-CM | POA: Diagnosis not present

## 2011-11-27 DIAGNOSIS — K219 Gastro-esophageal reflux disease without esophagitis: Secondary | ICD-10-CM | POA: Diagnosis not present

## 2011-12-10 DIAGNOSIS — S92919A Unspecified fracture of unspecified toe(s), initial encounter for closed fracture: Secondary | ICD-10-CM | POA: Diagnosis not present

## 2012-01-01 DIAGNOSIS — I4891 Unspecified atrial fibrillation: Secondary | ICD-10-CM | POA: Diagnosis not present

## 2012-01-01 DIAGNOSIS — Z7901 Long term (current) use of anticoagulants: Secondary | ICD-10-CM | POA: Diagnosis not present

## 2012-01-29 DIAGNOSIS — Z7901 Long term (current) use of anticoagulants: Secondary | ICD-10-CM | POA: Diagnosis not present

## 2012-01-29 DIAGNOSIS — I4891 Unspecified atrial fibrillation: Secondary | ICD-10-CM | POA: Diagnosis not present

## 2012-01-30 DIAGNOSIS — IMO0002 Reserved for concepts with insufficient information to code with codable children: Secondary | ICD-10-CM | POA: Diagnosis not present

## 2012-01-30 DIAGNOSIS — M25569 Pain in unspecified knee: Secondary | ICD-10-CM | POA: Diagnosis not present

## 2012-02-20 ENCOUNTER — Encounter: Payer: Self-pay | Admitting: Internal Medicine

## 2012-02-20 ENCOUNTER — Ambulatory Visit (INDEPENDENT_AMBULATORY_CARE_PROVIDER_SITE_OTHER): Payer: Medicare Other | Admitting: *Deleted

## 2012-02-20 DIAGNOSIS — I495 Sick sinus syndrome: Secondary | ICD-10-CM

## 2012-02-21 LAB — REMOTE PACEMAKER DEVICE
BATTERY VOLTAGE: 3.01 V
BRDY-0002RV: 50 {beats}/min
DEVICE MODEL PM: 7222573

## 2012-02-25 ENCOUNTER — Encounter: Payer: Self-pay | Admitting: *Deleted

## 2012-03-03 DIAGNOSIS — Z7901 Long term (current) use of anticoagulants: Secondary | ICD-10-CM | POA: Diagnosis not present

## 2012-03-03 DIAGNOSIS — I4891 Unspecified atrial fibrillation: Secondary | ICD-10-CM | POA: Diagnosis not present

## 2012-03-18 ENCOUNTER — Ambulatory Visit: Payer: Medicare Other | Admitting: Cardiology

## 2012-03-24 DIAGNOSIS — R05 Cough: Secondary | ICD-10-CM | POA: Diagnosis not present

## 2012-03-24 DIAGNOSIS — Z7901 Long term (current) use of anticoagulants: Secondary | ICD-10-CM | POA: Diagnosis not present

## 2012-03-24 DIAGNOSIS — I4891 Unspecified atrial fibrillation: Secondary | ICD-10-CM | POA: Diagnosis not present

## 2012-03-24 DIAGNOSIS — R0602 Shortness of breath: Secondary | ICD-10-CM | POA: Diagnosis not present

## 2012-03-24 DIAGNOSIS — R5383 Other fatigue: Secondary | ICD-10-CM | POA: Diagnosis not present

## 2012-03-24 DIAGNOSIS — Z23 Encounter for immunization: Secondary | ICD-10-CM | POA: Diagnosis not present

## 2012-03-24 DIAGNOSIS — R5381 Other malaise: Secondary | ICD-10-CM | POA: Diagnosis not present

## 2012-04-23 DIAGNOSIS — Z7901 Long term (current) use of anticoagulants: Secondary | ICD-10-CM | POA: Diagnosis not present

## 2012-04-23 DIAGNOSIS — I4891 Unspecified atrial fibrillation: Secondary | ICD-10-CM | POA: Diagnosis not present

## 2012-04-28 ENCOUNTER — Ambulatory Visit: Payer: Medicare Other | Admitting: Cardiology

## 2012-05-25 ENCOUNTER — Ambulatory Visit (INDEPENDENT_AMBULATORY_CARE_PROVIDER_SITE_OTHER): Payer: Medicare Other | Admitting: *Deleted

## 2012-05-25 DIAGNOSIS — Z950 Presence of cardiac pacemaker: Secondary | ICD-10-CM

## 2012-05-25 DIAGNOSIS — I4891 Unspecified atrial fibrillation: Secondary | ICD-10-CM | POA: Diagnosis not present

## 2012-05-26 ENCOUNTER — Encounter: Payer: Self-pay | Admitting: *Deleted

## 2012-05-26 ENCOUNTER — Ambulatory Visit (INDEPENDENT_AMBULATORY_CARE_PROVIDER_SITE_OTHER): Payer: Medicare Other | Admitting: Cardiology

## 2012-05-26 ENCOUNTER — Encounter: Payer: Self-pay | Admitting: Cardiology

## 2012-05-26 VITALS — BP 118/72 | HR 84 | Ht 67.0 in | Wt 151.0 lb

## 2012-05-26 DIAGNOSIS — I4891 Unspecified atrial fibrillation: Secondary | ICD-10-CM | POA: Diagnosis not present

## 2012-05-26 DIAGNOSIS — Z7901 Long term (current) use of anticoagulants: Secondary | ICD-10-CM | POA: Diagnosis not present

## 2012-05-26 DIAGNOSIS — Z950 Presence of cardiac pacemaker: Secondary | ICD-10-CM | POA: Insufficient documentation

## 2012-05-26 LAB — REMOTE PACEMAKER DEVICE
RV LEAD AMPLITUDE: 12 mv
RV LEAD IMPEDENCE PM: 580 Ohm
VENTRICULAR PACING PM: 36

## 2012-05-26 MED ORDER — DIGOXIN 125 MCG PO TABS: 0.1250 mg | ORAL_TABLET | Freq: Every day | ORAL | Status: DC

## 2012-05-26 NOTE — Patient Instructions (Signed)
Continue your medication  Try and walk more   I will see you again in 6 months.

## 2012-05-26 NOTE — Progress Notes (Signed)
Susan [name] Date of Birth: May 26, 1921   History of Present Illness: Susan [name] is seen back today for followup. She states 2 months ago she was having some increased shortness of breath on exertion. Her Lanoxin dose was reduced by half and this seemed to help. She continues to take 40 mg of Lasix daily with a good response. She is only had to take an extra dose once or twice. She weighs herself daily and it has been stable. She's had no increase in edema. She denies any palpitations or dizziness. Coumadin checks have been going well.  Current Outpatient Prescriptions on File Prior to Visit  Medication Sig Dispense Refill  . fluticasone (FLONASE) 50 MCG/ACT nasal spray 2 sprays by Nasal route 2 (two) times daily as needed.        . Fluticasone-Salmeterol (ADVAIR DISKUS) 250-50 MCG/DOSE AEPB Inhale 1 puff into the lungs every 12 (twelve) hours.        . furosemide (LASIX) 40 MG tablet Take 20 mg by mouth 2 (two) times daily.       . Garlic 100 MG TABS Take by mouth.        . Glucosamine & Fish Oil 500-400-60-40 MG CAPS Take by mouth 2 (two) times daily.        Marland Kitchen HYDROcodone-acetaminophen (NORCO) 10-325 MG per tablet Take 1 tablet by mouth every 6 (six) hours as needed.       . metoprolol succinate (TOPROL-XL) 100 MG 24 hr tablet Take 1 tablet (100 mg total) by mouth daily. Take with or immediately following a meal.  90 tablet  2  . montelukast (SINGULAIR) 10 MG tablet Take 10 mg by mouth at bedtime.        . Multiple Vitamins-Minerals (OCUVITE ADULT 50+ PO) Take by mouth daily.        . multivitamin (THERAGRAN) per tablet Take 1 tablet by mouth daily.        . Omeprazole (PRILOSEC PO) Take by mouth 2 (two) times daily.       Marland Kitchen warfarin (COUMADIN) 5 MG tablet Take 5 mg by mouth as directed.        . Calcium Carbonate (CALTRATE 600 PO) Take by mouth 2 (two) times daily.       . Cholecalciferol (VITAMIN D-3 PO) Take 2,000 mg by mouth daily.       . Chromium Picolinate 200 MCG CAPS Take by mouth.         . Coenzyme Q10 (CO Q 10) 100 MG CAPS Take by mouth daily.      . Cyanocobalamin (VITAMIN B-12 PO) Take by mouth daily.      . diclofenac sodium (VOLTAREN) 1 % GEL Apply topically as needed.       . diltiazem (DILACOR XR) 120 MG 24 hr capsule Take 1 tablet by mouth Daily.      Marland Kitchen docusate sodium (COLACE) 100 MG capsule Take 100 mg by mouth as needed.        . donepezil (ARICEPT) 10 MG tablet Take 10 mg by mouth at bedtime.        . fish oil-omega-3 fatty acids 1000 MG capsule Take 2 g by mouth daily.        . [DISCONTINUED] Dexlansoprazole (DEXILANT PO) Take by mouth as needed.        . [DISCONTINUED] digoxin (LANOXIN) 0.125 MG tablet Take 2 tablets (250 mcg total) by mouth daily.        Allergies  Allergen Reactions  .  Prednisone     Long term has reaction; felt like passing out  . Sulfa Antibiotics Nausea And Vomiting  . Amoxicillin Rash    Past Medical History  Diagnosis Date  . Hypertension   . Asthma   . Dementia     mild  . Osteoarthritis   . Gallstones   . Chronic kidney disease     Stage 2 to 3  . Anemia   . Stroke     Remote R cerebellar stroke with gait abnormaility  . Atrial fibrillation     permanent  . Coronary artery disease   . Anticoagulant long-term use   . Chronic diastolic CHF (congestive heart failure)   . Valvular heart disease     Mitral and tricuspid insufficiency  . Tachy-brady syndrome   . Pacemaker May 2012    VVI per Dr. Johney Frame    Past Surgical History  Procedure Date  . Abdominal hysterectomy     total  . Appendectomy   . Hemorrhoid surgery   . Cataract extraction, bilateral   . Total knee arthroplasty   . Cholecystectomy   . Pacemaker insertion May 2012    St Jude Accent SR RF implant. Dr. Johney Frame    History  Smoking status  . Never Smoker   Smokeless tobacco  . Not on file    History  Alcohol Use No    Family History  Problem Relation Age of Onset  . Clotting disorder Mother   . Suicidality Father     Review  of Systems: The review of systems is as above.  All other systems were reviewed and are negative.  Physical Exam: BP 118/72  Pulse 84  Ht 5\' 7"  (1.702 m)  Wt 68.493 kg (151 lb)  BMI 23.65 kg/m2 Patient is very pleasant and in no acute distress. She looks younger than her stated age. Skin is warm and dry. Color is normal.  HEENT is unremarkable. Normocephalic/atraumatic. PERRL. Sclera are nonicteric. Neck is supple. No masses. No JVD. Lungs are clear. Cardiac exam shows an irregular rate and rhythm. Normal S1 and S2. No gallops or murmur. Abdomen is soft. Extremities are without edema. Gait and ROM are intact. No gross neurologic deficits noted.  LABORATORY DATA: ECG today demonstrates atrial fibrillation with a rate of 82 beats per minute. She has diffuse nonspecific ST-T wave changes consistent with dig effect. There is right axis.   Assessment / Plan: 1. Atrial fibrillation, permanent. Managed with rate control and anticoagulation. Continue current dose of digoxin and metoprolol. Coumadin is managed at [facility]. 2. Chronic congestive heart failure with diastolic dysfunction. She has class II symptoms. Continue her current diuretic therapy and sodium restriction. May take extra Lasix as needed for weight gain or increased swelling. 3. Sick sinus syndrome status post pacemaker implant in May of 2012. She just called in her pacemaker checked last night. We will review.

## 2012-06-02 ENCOUNTER — Encounter: Payer: Self-pay | Admitting: *Deleted

## 2012-06-15 ENCOUNTER — Encounter: Payer: Self-pay | Admitting: Internal Medicine

## 2012-06-24 DIAGNOSIS — Z7901 Long term (current) use of anticoagulants: Secondary | ICD-10-CM | POA: Diagnosis not present

## 2012-06-24 DIAGNOSIS — I4891 Unspecified atrial fibrillation: Secondary | ICD-10-CM | POA: Diagnosis not present

## 2012-07-31 DIAGNOSIS — Z1331 Encounter for screening for depression: Secondary | ICD-10-CM | POA: Diagnosis not present

## 2012-07-31 DIAGNOSIS — J45909 Unspecified asthma, uncomplicated: Secondary | ICD-10-CM | POA: Diagnosis not present

## 2012-07-31 DIAGNOSIS — I4891 Unspecified atrial fibrillation: Secondary | ICD-10-CM | POA: Diagnosis not present

## 2012-07-31 DIAGNOSIS — Z7901 Long term (current) use of anticoagulants: Secondary | ICD-10-CM | POA: Diagnosis not present

## 2012-08-31 ENCOUNTER — Other Ambulatory Visit: Payer: Self-pay | Admitting: Internal Medicine

## 2012-08-31 ENCOUNTER — Ambulatory Visit (INDEPENDENT_AMBULATORY_CARE_PROVIDER_SITE_OTHER): Payer: Medicare Other | Admitting: *Deleted

## 2012-08-31 DIAGNOSIS — I495 Sick sinus syndrome: Secondary | ICD-10-CM

## 2012-08-31 DIAGNOSIS — Z950 Presence of cardiac pacemaker: Secondary | ICD-10-CM

## 2012-09-02 DIAGNOSIS — I4891 Unspecified atrial fibrillation: Secondary | ICD-10-CM | POA: Diagnosis not present

## 2012-09-04 LAB — REMOTE PACEMAKER DEVICE
BRDY-0002RV: 50 {beats}/min
RV LEAD AMPLITUDE: 12 mv
VENTRICULAR PACING PM: 30

## 2012-09-14 ENCOUNTER — Encounter: Payer: Self-pay | Admitting: *Deleted

## 2012-09-17 ENCOUNTER — Encounter: Payer: Self-pay | Admitting: Internal Medicine

## 2012-10-01 DIAGNOSIS — I4891 Unspecified atrial fibrillation: Secondary | ICD-10-CM | POA: Diagnosis not present

## 2012-10-01 DIAGNOSIS — Z7901 Long term (current) use of anticoagulants: Secondary | ICD-10-CM | POA: Diagnosis not present

## 2012-10-19 DIAGNOSIS — H52209 Unspecified astigmatism, unspecified eye: Secondary | ICD-10-CM | POA: Diagnosis not present

## 2012-10-19 DIAGNOSIS — Z961 Presence of intraocular lens: Secondary | ICD-10-CM | POA: Diagnosis not present

## 2012-10-19 DIAGNOSIS — H47099 Other disorders of optic nerve, not elsewhere classified, unspecified eye: Secondary | ICD-10-CM | POA: Diagnosis not present

## 2012-10-19 DIAGNOSIS — H4720 Unspecified optic atrophy: Secondary | ICD-10-CM | POA: Diagnosis not present

## 2012-11-05 ENCOUNTER — Encounter: Payer: Self-pay | Admitting: Cardiology

## 2012-11-05 ENCOUNTER — Ambulatory Visit (INDEPENDENT_AMBULATORY_CARE_PROVIDER_SITE_OTHER): Payer: Medicare Other | Admitting: Cardiology

## 2012-11-05 VITALS — BP 144/78 | HR 58 | Ht 67.0 in | Wt 149.8 lb

## 2012-11-05 DIAGNOSIS — I10 Essential (primary) hypertension: Secondary | ICD-10-CM

## 2012-11-05 DIAGNOSIS — I5032 Chronic diastolic (congestive) heart failure: Secondary | ICD-10-CM | POA: Diagnosis not present

## 2012-11-05 DIAGNOSIS — I495 Sick sinus syndrome: Secondary | ICD-10-CM | POA: Diagnosis not present

## 2012-11-05 DIAGNOSIS — I4891 Unspecified atrial fibrillation: Secondary | ICD-10-CM | POA: Diagnosis not present

## 2012-11-05 DIAGNOSIS — Z7901 Long term (current) use of anticoagulants: Secondary | ICD-10-CM | POA: Diagnosis not present

## 2012-11-05 DIAGNOSIS — I509 Heart failure, unspecified: Secondary | ICD-10-CM

## 2012-11-05 DIAGNOSIS — I951 Orthostatic hypotension: Secondary | ICD-10-CM

## 2012-11-05 NOTE — Progress Notes (Signed)
Susan [name] Date of Birth: Aug 05, 1920   History of Present Illness: Susan [name] is seen back today for followup. She reports that she's had several days of lightheadedness when she first gets up in the morning. When she stands up she feels like she gets pulled back and neck she has to fall back into bed. A couple of these occasions her blood pressure was low with one reading down to 85/46. She also complains of shortness of breath. This has been a chronic complaint. She admits that she doesn't do much exertion but when she goes out to her mailbox and back she has to stop because of her shortness of breath. She also complains that she has to clear her throat a lot. Her weight has been stable. She's had no increase in edema. She is currently taking 40 mg of Lasix daily.  Current Outpatient Prescriptions on File Prior to Visit  Medication Sig Dispense Refill  . Calcium Carbonate (CALTRATE 600 PO) Take by mouth 2 (two) times daily.       . Cholecalciferol (VITAMIN D-3 PO) Take 2,000 mg by mouth daily.       . Chromium Picolinate 200 MCG CAPS Take by mouth.        . Coenzyme Q10 (CO Q 10) 100 MG CAPS Take by mouth daily.      . Cyanocobalamin (VITAMIN B-12 PO) Take by mouth daily.      . diclofenac sodium (VOLTAREN) 1 % GEL Apply topically as needed.       . diltiazem (DILACOR XR) 120 MG 24 hr capsule Take 1 tablet by mouth Daily.      Marland Kitchen docusate sodium (COLACE) 100 MG capsule Take 100 mg by mouth as needed.        . donepezil (ARICEPT) 10 MG tablet Take 10 mg by mouth at bedtime.        . fish oil-omega-3 fatty acids 1000 MG capsule Take 2 g by mouth daily.        . fluticasone (FLONASE) 50 MCG/ACT nasal spray 2 sprays by Nasal route 2 (two) times daily as needed.        . Fluticasone-Salmeterol (ADVAIR DISKUS) 250-50 MCG/DOSE AEPB Inhale 1 puff into the lungs every 12 (twelve) hours.        . furosemide (LASIX) 40 MG tablet Take 20 mg by mouth 2 (two) times daily.       . Garlic 100 MG TABS Take  by mouth.        . Glucosamine & Fish Oil 500-400-60-40 MG CAPS Take by mouth 2 (two) times daily.        Marland Kitchen HYDROcodone-acetaminophen (NORCO) 10-325 MG per tablet Take 1 tablet by mouth every 6 (six) hours as needed.       . metoprolol succinate (TOPROL-XL) 100 MG 24 hr tablet Take 1 tablet (100 mg total) by mouth daily. Take with or immediately following a meal.  90 tablet  2  . montelukast (SINGULAIR) 10 MG tablet Take 10 mg by mouth at bedtime.        . Multiple Vitamins-Minerals (OCUVITE ADULT 50+ PO) Take by mouth daily.        . multivitamin (THERAGRAN) per tablet Take 1 tablet by mouth daily.        . Omeprazole (PRILOSEC PO) Take by mouth 2 (two) times daily.       Marland Kitchen warfarin (COUMADIN) 5 MG tablet Take 5 mg by mouth as directed.        . [  DISCONTINUED] Dexlansoprazole (DEXILANT PO) Take by mouth as needed.         No current facility-administered medications on file prior to visit.    Allergies  Allergen Reactions  . Prednisone     Long term has reaction; felt like passing out  . Sulfa Antibiotics Nausea And Vomiting  . Amoxicillin Rash    Past Medical History  Diagnosis Date  . Hypertension   . Asthma   . Dementia     mild  . Osteoarthritis   . Gallstones   . Chronic kidney disease     Stage 2 to 3  . Anemia   . Stroke     Remote R cerebellar stroke with gait abnormaility  . Atrial fibrillation     permanent  . Coronary artery disease   . Anticoagulant long-term use   . Chronic diastolic CHF (congestive heart failure)   . Valvular heart disease     Mitral and tricuspid insufficiency  . Tachy-brady syndrome   . Pacemaker May 2012    VVI per Dr. Johney Frame    Past Surgical History  Procedure Laterality Date  . Abdominal hysterectomy      total  . Appendectomy    . Hemorrhoid surgery    . Cataract extraction, bilateral    . Total knee arthroplasty    . Cholecystectomy    . Pacemaker insertion  May 2012    St Jude Accent SR RF implant. Dr. Johney Frame     History  Smoking status  . Never Smoker   Smokeless tobacco  . Not on file    History  Alcohol Use No    Family History  Problem Relation Age of Onset  . Clotting disorder Mother   . Suicidality Father     Review of Systems: The review of systems is as above.  All other systems were reviewed and are negative.  Physical Exam: BP 144/78  Pulse 58  Ht 5\' 7"  (1.702 m)  Wt 149 lb 12.8 oz (67.949 kg)  BMI 23.46 kg/m2  SpO2 96% Patient is very pleasant and in no acute distress. She looks younger than her stated age. Skin is warm and dry. Color is normal.  HEENT is unremarkable. Normocephalic/atraumatic. PERRL. Sclera are nonicteric. Neck is supple. No masses. No JVD. Lungs are clear. Cardiac exam shows an irregular rate and rhythm. Normal S1 and S2. No gallops or murmur. Abdomen is soft. Extremities are without edema. Gait and ROM are intact. No gross neurologic deficits noted.  LABORATORY DATA:    Assessment / Plan: 1. Atrial fibrillation, permanent. Managed with rate control and anticoagulation. Continue current dose of digoxin,metoprolol, and diltiazem. Coumadin is managed at [facility]. 2. Chronic congestive heart failure with diastolic dysfunction. She has class II symptoms. Weight is actually down 2 pounds and she has no evidence of volume overload. 3. Sick sinus syndrome status post pacemaker implant in May of 2012. Followed in our pacemaker clinic. 4. Orthostatic dizziness. I recommended reducing her Lasix to 20 mg once a day. If she has increased swelling or weight she can take an extra dose but hopefully this will minimize her orthostasis. 5. Dyspnea. This is multifactorial including history of asthma, diastolic heart failure, and deconditioning.

## 2012-11-05 NOTE — Patient Instructions (Addendum)
Reduce lasix to 20 mg daily. If your weight increases or you have more swelling you can take 2.  Continue sodium restriction.  Continue your other therapy  I will get a copy of your lab work from Dr. Felipa Eth.  I will see you in 6 months.

## 2012-11-20 ENCOUNTER — Ambulatory Visit (INDEPENDENT_AMBULATORY_CARE_PROVIDER_SITE_OTHER): Payer: Medicare Other | Admitting: Internal Medicine

## 2012-11-20 ENCOUNTER — Encounter: Payer: Self-pay | Admitting: Internal Medicine

## 2012-11-20 VITALS — BP 114/60 | HR 60 | Ht 67.0 in | Wt 152.1 lb

## 2012-11-20 DIAGNOSIS — I4891 Unspecified atrial fibrillation: Secondary | ICD-10-CM

## 2012-11-20 DIAGNOSIS — Z950 Presence of cardiac pacemaker: Secondary | ICD-10-CM | POA: Diagnosis not present

## 2012-11-20 DIAGNOSIS — I495 Sick sinus syndrome: Secondary | ICD-10-CM

## 2012-11-20 NOTE — Progress Notes (Signed)
PCP: Hoyle Sauer, MD Primary Cardiologist:  Dr [place]  The patient presents today for routine electrophysiology followup.  Since last being seen in our clinic, the patient reports doing reasonably well.   She remains very active for her age.  Today, she denies symptoms of palpitations, chest pain,  orthopnea, PND, lower extremity edema, presyncope, syncope, or neurologic sequela. She has stable edema.  Her dizziness is improved.  The patient feels that she is tolerating medications without difficulties and is otherwise without complaint today.   Past Medical History  Diagnosis Date  . Hypertension   . Asthma   . Dementia     mild  . Osteoarthritis   . Gallstones   . Chronic kidney disease     Stage 2 to 3  . Anemia   . Stroke     Remote R cerebellar stroke with gait abnormaility  . Atrial fibrillation     permanent  . Coronary artery disease   . Anticoagulant long-term use   . Chronic diastolic CHF (congestive heart failure)   . Valvular heart disease     Mitral and tricuspid insufficiency  . Tachy-brady syndrome   . Pacemaker May 2012    VVI per Dr. Johney Frame   Past Surgical History  Procedure Laterality Date  . Abdominal hysterectomy      total  . Appendectomy    . Hemorrhoid surgery    . Cataract extraction, bilateral    . Total knee arthroplasty    . Cholecystectomy    . Pacemaker insertion  May 2012    St Jude Accent SR RF implant. Dr. Johney Frame    Current Outpatient Prescriptions  Medication Sig Dispense Refill  . Calcium Carbonate (CALTRATE 600 PO) Take by mouth 2 (two) times daily.       . Cholecalciferol (VITAMIN D-3 PO) Take 2,000 mg by mouth daily.       . Chromium Picolinate 200 MCG CAPS Take by mouth.        . Coenzyme Q10 (CO Q 10) 100 MG CAPS Take by mouth daily.      . Cyanocobalamin (VITAMIN B-12 PO) Take by mouth daily.      . diclofenac sodium (VOLTAREN) 1 % GEL Apply topically as needed.       . digoxin (LANOXIN) 0.125 MG tablet Take 0.125 mg by  mouth as directed. Take 1 tablet one day and next day take 2 tablets      . diltiazem (DILACOR XR) 120 MG 24 hr capsule Take 1 tablet by mouth Daily.      Marland Kitchen docusate sodium (COLACE) 100 MG capsule Take 100 mg by mouth as needed.        . donepezil (ARICEPT) 10 MG tablet Take 10 mg by mouth at bedtime.        . fish oil-omega-3 fatty acids 1000 MG capsule Take 2 g by mouth daily.        . fluticasone (FLONASE) 50 MCG/ACT nasal spray 2 sprays by Nasal route 2 (two) times daily as needed.        . Fluticasone-Salmeterol (ADVAIR DISKUS) 250-50 MCG/DOSE AEPB Inhale 1 puff into the lungs every 12 (twelve) hours.        . furosemide (LASIX) 40 MG tablet Take 20 mg by mouth 2 (two) times daily.       . Garlic 100 MG TABS Take by mouth.        . Glucosamine & Fish Oil 500-400-60-40 MG CAPS Take by mouth 2 (two)  times daily.        Marland Kitchen HYDROcodone-acetaminophen (NORCO) 10-325 MG per tablet Take 1 tablet by mouth every 6 (six) hours as needed.       . metoprolol succinate (TOPROL-XL) 100 MG 24 hr tablet Take 1 tablet (100 mg total) by mouth daily. Take with or immediately following a meal.  90 tablet  2  . montelukast (SINGULAIR) 10 MG tablet Take 10 mg by mouth at bedtime.        . Multiple Vitamins-Minerals (OCUVITE ADULT 50+ PO) Take by mouth daily.        . multivitamin (THERAGRAN) per tablet Take 1 tablet by mouth daily.        . Omeprazole (PRILOSEC PO) Take by mouth 2 (two) times daily.       Marland Kitchen warfarin (COUMADIN) 5 MG tablet Take 5 mg by mouth as directed.        . [DISCONTINUED] Dexlansoprazole (DEXILANT PO) Take by mouth as needed.         No current facility-administered medications for this visit.    Allergies  Allergen Reactions  . Prednisone     Long term has reaction; felt like passing out  . Sulfa Antibiotics Nausea And Vomiting  . Amoxicillin Rash    History   Social History  . Marital Status: Widowed    Spouse Name: N/A    Number of Children: N/A  . Years of Education: N/A    Occupational History  . Not on file.   Social History Main Topics  . Smoking status: Never Smoker   . Smokeless tobacco: Not on file  . Alcohol Use: No  . Drug Use: No  . Sexually Active: Not on file   Other Topics Concern  . Not on file   Social History Narrative   Pt lives in [place] with daughter.  Attends [employer]   Retired from an [employer].    Family History  Problem Relation Age of Onset  . Clotting disorder Mother   . Suicidality Father     Physical Exam: Filed Vitals:   11/20/12 1117  BP: 114/60  Pulse: 60  Height: 5\' 7"  (1.702 m)  Weight: 152 lb 1.9 oz (69.001 kg)    GEN- The patient is well appearing, alert and oriented x 3 today.   Head- normocephalic, atraumatic Eyes-  Sclera clear, conjunctiva pink Ears- hearing intact Oropharynx- clear Neck- supple, no JVP Lymph- no cervical lymphadenopathy Lungs- Clear to ausculation bilaterally, normal work of breathing Chest- pacemaker pocket is well healed Heart- irregular rate and rhythm, no murmurs, rubs or gallops, PMI not laterally displaced GI- soft, NT, ND, + BS Extremities- no clubbing, cyanosis, or edema  Pacemaker interrogation- reviewed in detail today,  See PACEART report  Assessment and Plan:  1. Tachycardia/ bradycardia syndrome Normal pacemaker function See Pace Art report No changes today  2. Permanent atrial fibrillation Rate controlled Continue coumadin long term

## 2012-11-20 NOTE — Patient Instructions (Addendum)
Your physician wants you to follow-up in: 12 months with Dr Allred You will receive a reminder letter in the mail two months in advance. If you don't receive a letter, please call our office to schedule the follow-up appointment.     Remote monitoring is used to monitor your Pacemaker of ICD from home. This monitoring reduces the number of office visits required to check your device to one time per year. It allows us to keep an eye on the functioning of your device to ensure it is working properly. You are scheduled for a device check from home on 02/22/13. You may send your transmission at any time that day. If you have a wireless device, the transmission will be sent automatically. After your physician reviews your transmission, you will receive a postcard with your next transmission date.   

## 2012-11-23 ENCOUNTER — Ambulatory Visit: Payer: Medicare Other | Admitting: Cardiology

## 2012-11-27 DIAGNOSIS — I4891 Unspecified atrial fibrillation: Secondary | ICD-10-CM | POA: Diagnosis not present

## 2012-11-27 DIAGNOSIS — M25569 Pain in unspecified knee: Secondary | ICD-10-CM | POA: Diagnosis not present

## 2012-11-27 DIAGNOSIS — I10 Essential (primary) hypertension: Secondary | ICD-10-CM | POA: Diagnosis not present

## 2012-11-27 DIAGNOSIS — Z1331 Encounter for screening for depression: Secondary | ICD-10-CM | POA: Diagnosis not present

## 2012-11-27 DIAGNOSIS — R609 Edema, unspecified: Secondary | ICD-10-CM | POA: Diagnosis not present

## 2012-11-27 DIAGNOSIS — Z7901 Long term (current) use of anticoagulants: Secondary | ICD-10-CM | POA: Diagnosis not present

## 2012-11-27 DIAGNOSIS — J45909 Unspecified asthma, uncomplicated: Secondary | ICD-10-CM | POA: Diagnosis not present

## 2012-11-27 DIAGNOSIS — R5381 Other malaise: Secondary | ICD-10-CM | POA: Diagnosis not present

## 2012-11-27 DIAGNOSIS — D539 Nutritional anemia, unspecified: Secondary | ICD-10-CM | POA: Diagnosis not present

## 2012-12-09 DIAGNOSIS — B354 Tinea corporis: Secondary | ICD-10-CM | POA: Diagnosis not present

## 2012-12-28 DIAGNOSIS — Z7901 Long term (current) use of anticoagulants: Secondary | ICD-10-CM | POA: Diagnosis not present

## 2012-12-28 DIAGNOSIS — I4891 Unspecified atrial fibrillation: Secondary | ICD-10-CM | POA: Diagnosis not present

## 2013-01-02 ENCOUNTER — Emergency Department (HOSPITAL_BASED_OUTPATIENT_CLINIC_OR_DEPARTMENT_OTHER)
Admission: EM | Admit: 2013-01-02 | Discharge: 2013-01-02 | Disposition: A | Discharge status: Home / Self Care | Payer: Medicare Other | Attending: Emergency Medicine | Admitting: Emergency Medicine

## 2013-01-02 ENCOUNTER — Emergency Department (HOSPITAL_BASED_OUTPATIENT_CLINIC_OR_DEPARTMENT_OTHER): Payer: Medicare Other

## 2013-01-02 ENCOUNTER — Encounter (HOSPITAL_BASED_OUTPATIENT_CLINIC_OR_DEPARTMENT_OTHER): Payer: Self-pay | Admitting: Radiology

## 2013-01-02 DIAGNOSIS — J45901 Unspecified asthma with (acute) exacerbation: Secondary | ICD-10-CM | POA: Insufficient documentation

## 2013-01-02 DIAGNOSIS — M1990 Unspecified osteoarthritis, unspecified site: Secondary | ICD-10-CM | POA: Insufficient documentation

## 2013-01-02 DIAGNOSIS — Z950 Presence of cardiac pacemaker: Secondary | ICD-10-CM | POA: Diagnosis not present

## 2013-01-02 DIAGNOSIS — I4891 Unspecified atrial fibrillation: Secondary | ICD-10-CM | POA: Diagnosis not present

## 2013-01-02 DIAGNOSIS — R609 Edema, unspecified: Secondary | ICD-10-CM | POA: Insufficient documentation

## 2013-01-02 DIAGNOSIS — L03031 Cellulitis of right toe: Secondary | ICD-10-CM

## 2013-01-02 DIAGNOSIS — Z8719 Personal history of other diseases of the digestive system: Secondary | ICD-10-CM | POA: Diagnosis not present

## 2013-01-02 DIAGNOSIS — I2510 Atherosclerotic heart disease of native coronary artery without angina pectoris: Secondary | ICD-10-CM | POA: Insufficient documentation

## 2013-01-02 DIAGNOSIS — Z79899 Other long term (current) drug therapy: Secondary | ICD-10-CM | POA: Diagnosis not present

## 2013-01-02 DIAGNOSIS — L03039 Cellulitis of unspecified toe: Secondary | ICD-10-CM | POA: Insufficient documentation

## 2013-01-02 DIAGNOSIS — Z7901 Long term (current) use of anticoagulants: Secondary | ICD-10-CM | POA: Diagnosis not present

## 2013-01-02 DIAGNOSIS — F0390 Unspecified dementia without behavioral disturbance: Secondary | ICD-10-CM | POA: Diagnosis not present

## 2013-01-02 DIAGNOSIS — Z8679 Personal history of other diseases of the circulatory system: Secondary | ICD-10-CM | POA: Diagnosis not present

## 2013-01-02 DIAGNOSIS — R079 Chest pain, unspecified: Secondary | ICD-10-CM | POA: Diagnosis not present

## 2013-01-02 DIAGNOSIS — N183 Chronic kidney disease, stage 3 unspecified: Secondary | ICD-10-CM | POA: Diagnosis not present

## 2013-01-02 DIAGNOSIS — Z8673 Personal history of transient ischemic attack (TIA), and cerebral infarction without residual deficits: Secondary | ICD-10-CM | POA: Diagnosis not present

## 2013-01-02 DIAGNOSIS — IMO0002 Reserved for concepts with insufficient information to code with codable children: Secondary | ICD-10-CM | POA: Insufficient documentation

## 2013-01-02 DIAGNOSIS — Z96659 Presence of unspecified artificial knee joint: Secondary | ICD-10-CM | POA: Insufficient documentation

## 2013-01-02 DIAGNOSIS — D649 Anemia, unspecified: Secondary | ICD-10-CM | POA: Insufficient documentation

## 2013-01-02 DIAGNOSIS — Z880 Allergy status to penicillin: Secondary | ICD-10-CM | POA: Diagnosis not present

## 2013-01-02 DIAGNOSIS — I5032 Chronic diastolic (congestive) heart failure: Secondary | ICD-10-CM | POA: Diagnosis not present

## 2013-01-02 DIAGNOSIS — L02419 Cutaneous abscess of limb, unspecified: Secondary | ICD-10-CM | POA: Diagnosis not present

## 2013-01-02 DIAGNOSIS — L02619 Cutaneous abscess of unspecified foot: Secondary | ICD-10-CM | POA: Insufficient documentation

## 2013-01-02 DIAGNOSIS — I129 Hypertensive chronic kidney disease with stage 1 through stage 4 chronic kidney disease, or unspecified chronic kidney disease: Secondary | ICD-10-CM | POA: Insufficient documentation

## 2013-01-02 LAB — COMPREHENSIVE METABOLIC PANEL
ALT: 18 U/L (ref 0–35)
AST: 25 U/L (ref 0–37)
Alkaline Phosphatase: 72 U/L (ref 39–117)
Calcium: 9.8 mg/dL (ref 8.4–10.5)
Potassium: 4.1 mEq/L (ref 3.5–5.1)
Sodium: 141 mEq/L (ref 135–145)
Total Protein: 6.9 g/dL (ref 6.0–8.3)

## 2013-01-02 LAB — CBC WITH DIFFERENTIAL/PLATELET
Basophils Absolute: 0 10*3/uL (ref 0.0–0.1)
Eosinophils Absolute: 0.1 10*3/uL (ref 0.0–0.7)
Eosinophils Relative: 2 % (ref 0–5)
Lymphocytes Relative: 21 % (ref 12–46)
MCH: 28 pg (ref 26.0–34.0)
MCV: 85.9 fL (ref 78.0–100.0)
Neutrophils Relative %: 68 % (ref 43–77)
Platelets: 189 10*3/uL (ref 150–400)
RBC: 4.18 MIL/uL (ref 3.87–5.11)
RDW: 15.5 % (ref 11.5–15.5)
WBC: 8.8 10*3/uL (ref 4.0–10.5)

## 2013-01-02 LAB — DIGOXIN LEVEL: Digoxin Level: 1.2 ng/mL (ref 0.8–2.0)

## 2013-01-02 LAB — PROTIME-INR
INR: 1.92 — ABNORMAL HIGH (ref 0.00–1.49)
Prothrombin Time: 21.4 seconds — ABNORMAL HIGH (ref 11.6–15.2)

## 2013-01-02 MED ORDER — CLINDAMYCIN HCL 150 MG PO CAPS: 300.0000 mg | ORAL_CAPSULE | Freq: Three times a day (TID) | ORAL | Status: DC

## 2013-01-02 MED ORDER — FUROSEMIDE 10 MG/ML IJ SOLN
40.0000 mg | Freq: Once | INTRAMUSCULAR | Status: AC
  Administered 2013-01-02: 40 mg via INTRAVENOUS
  Filled 2013-01-02: qty 4

## 2013-01-02 MED ORDER — FUROSEMIDE 40 MG PO TABS: 40.0000 mg | ORAL_TABLET | Freq: Once | ORAL | Status: DC

## 2013-01-02 NOTE — ED Provider Notes (Signed)
History    CSN: [id number] Arrival date & time 01/02/13  1159  First MD Initiated Contact with Patient 01/02/13 1204     Chief Complaint  Patient presents with  . Extremity Pain  . Shortness of Breath   (Consider location/radiation/quality/duration/timing/severity/associated sxs/prior Treatment) HPI Susan [name] is a 79 y.o. female who presents to ED with complaint of right toe pain and progressive leg swelling and shortness of breath on excertion. Pt states right 2nd toe is red, tender, callus to that toe for "months" that Susan [name] gets trimmed, states last trimmed about 4 weeks ago. States toe pain comes and goes, but today it is severe. Pt also reports hx of CHF, and a pacemaker, states has LE swelling normally at the end of the day, reports this morning waking up with increased swelling and bluish discoloration in bilateral feet. States Susan [name] has also had progressive shortness of breath on excertion for last several weeks, worse in the last weeks. States sob when walking from a chair to a sofa.  Pt apparently has been increasing her own dose of lasix at home. Susan [name] also reports weighing herself daily with no weight gain. Pt denies fever, chills, cough, URI symptoms, chest pain, recent illnesses or hospitalizations.   Past Medical History  Diagnosis Date  . Hypertension   . Asthma   . Dementia     mild  . Osteoarthritis   . Gallstones   . Chronic kidney disease     Stage 2 to 3  . Anemia   . Stroke     Remote R cerebellar stroke with gait abnormaility  . Atrial fibrillation     permanent  . Coronary artery disease   . Anticoagulant long-term use   . Chronic diastolic CHF (congestive heart failure)   . Valvular heart disease     Mitral and tricuspid insufficiency  . Tachy-brady syndrome   . Pacemaker May 2012    VVI per Dr. Johney Frame   Past Surgical History  Procedure Laterality Date  . Abdominal hysterectomy      total  . Appendectomy    . Hemorrhoid surgery    . Cataract  extraction, bilateral    . Total knee arthroplasty    . Cholecystectomy    . Pacemaker insertion  May 2012    St Jude Accent SR RF implant. Dr. Johney Frame   Family History  Problem Relation Age of Onset  . Clotting disorder Mother   . Suicidality Father    History  Substance Use Topics  . Smoking status: Never Smoker   . Smokeless tobacco: Not on file  . Alcohol Use: No   OB History   Grav Para Term Preterm Abortions TAB SAB Ect Mult Living                 Review of Systems  Constitutional: Negative for fever and chills.  HENT: Negative for neck pain and neck stiffness.   Respiratory: Positive for shortness of breath. Negative for cough, chest tightness and wheezing.   Cardiovascular: Positive for leg swelling. Negative for chest pain and palpitations.  Musculoskeletal: Positive for arthralgias.  Skin: Positive for color change and wound.  Neurological: Negative for headaches.  All other systems reviewed and are negative.    Allergies  Prednisone; Sulfa antibiotics; and Amoxicillin  Home Medications   Current Outpatient Rx  Name  Route  Sig  Dispense  Refill  . Calcium Carbonate (CALTRATE 600 PO)   Oral   Take by mouth 2 (  two) times daily.          . Cholecalciferol (VITAMIN D-3 PO)   Oral   Take 2,000 mg by mouth daily.          . Chromium Picolinate 200 MCG CAPS   Oral   Take by mouth.           . Coenzyme Q10 (CO Q 10) 100 MG CAPS   Oral   Take by mouth daily.         . Cyanocobalamin (VITAMIN B-12 PO)   Oral   Take by mouth daily.         . diclofenac sodium (VOLTAREN) 1 % GEL   Topical   Apply topically as needed.          . digoxin (LANOXIN) 0.125 MG tablet   Oral   Take 0.125 mg by mouth as directed. Take 1 tablet one day and next day take 2 tablets         . diltiazem (DILACOR XR) 120 MG 24 hr capsule   Oral   Take 1 tablet by mouth Daily.         Marland Kitchen docusate sodium (COLACE) 100 MG capsule   Oral   Take 100 mg by mouth as  needed.           . donepezil (ARICEPT) 10 MG tablet   Oral   Take 10 mg by mouth at bedtime.           . fish oil-omega-3 fatty acids 1000 MG capsule   Oral   Take 2 g by mouth daily.           . fluticasone (FLONASE) 50 MCG/ACT nasal spray   Nasal   2 sprays by Nasal route 2 (two) times daily as needed.           . Fluticasone-Salmeterol (ADVAIR DISKUS) 250-50 MCG/DOSE AEPB   Inhalation   Inhale 1 puff into the lungs every 12 (twelve) hours.           . furosemide (LASIX) 40 MG tablet   Oral   Take 20 mg by mouth 2 (two) times daily.          . Garlic 100 MG TABS   Oral   Take by mouth.           . Glucosamine & Fish Oil 500-400-60-40 MG CAPS   Oral   Take by mouth 2 (two) times daily.           Marland Kitchen HYDROcodone-acetaminophen (NORCO) 10-325 MG per tablet   Oral   Take 1 tablet by mouth every 6 (six) hours as needed.          . metoprolol succinate (TOPROL-XL) 100 MG 24 hr tablet   Oral   Take 1 tablet (100 mg total) by mouth daily. Take with or immediately following a meal.   90 tablet   2   . montelukast (SINGULAIR) 10 MG tablet   Oral   Take 10 mg by mouth at bedtime.           . Multiple Vitamins-Minerals (OCUVITE ADULT 50+ PO)   Oral   Take by mouth daily.           . multivitamin (THERAGRAN) per tablet   Oral   Take 1 tablet by mouth daily.           . Omeprazole (PRILOSEC PO)   Oral   Take by mouth 2 (two) times daily.          Marland Kitchen  warfarin (COUMADIN) 5 MG tablet   Oral   Take 5 mg by mouth as directed.            BP 135/48  Pulse 89  Temp(Src) 97.5 F (36.4 C) (Oral)  Resp 18  Ht 5\' 7"  (1.702 m)  Wt 147 lb (66.679 kg)  BMI 23.02 kg/m2  SpO2 99% Physical Exam  Nursing note and vitals reviewed. Constitutional: Susan [name] is oriented to person, place, and time. Susan [name] appears well-developed and well-nourished. No distress.  Eyes: Conjunctivae are normal.  Neck: Neck supple.  Cardiovascular: Normal rate, regular rhythm and  normal heart sounds.   Pulmonary/Chest: Effort normal and breath sounds normal. No respiratory distress. Susan [name] has no wheezes. Susan [name] has no rales.  Abdominal: Soft. Bowel sounds are normal. Susan [name] exhibits no distension. There is no tenderness.  Musculoskeletal: Susan [name] exhibits edema.  1+ pitting LE edema, with most swelling around ankles and feet. Dorsal pedal pulses are weak but palpated bilaterally. Cap refill <2sec in all toes. Right 2nd toe is erythematous, tender to palpation. There is a callus to the tip of the toe, tender to palpation. No drainage.   Neurological: Susan [name] is alert and oriented to person, place, and time.  Skin: Skin is warm and dry.    ED Course  Procedures (including critical care time)  Results for orders placed during the hospital encounter of 01/02/13  CBC WITH DIFFERENTIAL      Result Value Range   WBC 8.8  4.0 - 10.5 K/uL   RBC 4.18  3.87 - 5.11 MIL/uL   Hemoglobin 11.7 (*) 12.0 - 15.0 g/dL   HCT 16.1 (*) 09.6 - 04.5 %   MCV 85.9  78.0 - 100.0 fL   MCH 28.0  26.0 - 34.0 pg   MCHC 32.6  30.0 - 36.0 g/dL   RDW 40.9  81.1 - 91.4 %   Platelets 189  150 - 400 K/uL   Neutrophils Relative % 68  43 - 77 %   Neutro Abs 6.0  1.7 - 7.7 K/uL   Lymphocytes Relative 21  12 - 46 %   Lymphs Abs 1.9  0.7 - 4.0 K/uL   Monocytes Relative 9  3 - 12 %   Monocytes Absolute 0.8  0.1 - 1.0 K/uL   Eosinophils Relative 2  0 - 5 %   Eosinophils Absolute 0.1  0.0 - 0.7 K/uL   Basophils Relative 0  0 - 1 %   Basophils Absolute 0.0  0.0 - 0.1 K/uL  COMPREHENSIVE METABOLIC PANEL      Result Value Range   Sodium 141  135 - 145 mEq/L   Potassium 4.1  3.5 - 5.1 mEq/L   Chloride 103  96 - 112 mEq/L   CO2 28  19 - 32 mEq/L   Glucose, Bld 96  70 - 99 mg/dL   BUN 23  6 - 23 mg/dL   Creatinine, Ser 7.82 (*) 0.50 - 1.10 mg/dL   Calcium 9.8  8.4 - 95.6 mg/dL   Total Protein 6.9  6.0 - 8.3 g/dL   Albumin 3.6  3.5 - 5.2 g/dL   AST 25  0 - 37 U/L   ALT 18  0 - 35 U/L   Alkaline Phosphatase 72   39 - 117 U/L   Total Bilirubin 0.5  0.3 - 1.2 mg/dL   GFR calc non Af Amer 39 (*) >90 mL/min   GFR calc Af Amer 45 (*) >90 mL/min  TROPONIN I  Result Value Range   Troponin I <0.30  <0.30 ng/mL  PRO B NATRIURETIC PEPTIDE      Result Value Range   Pro B Natriuretic peptide (BNP) 2083.0 (*) 0 - 450 pg/mL  PROTIME-INR      Result Value Range   Prothrombin Time 21.4 (*) 11.6 - 15.2 seconds   INR 1.92 (*) 0.00 - 1.49  DIGOXIN LEVEL      Result Value Range   Digoxin Level 1.2  0.8 - 2.0 ng/mL   Dg Chest 2 View  01/02/2013   *RADIOLOGY REPORT*  Clinical Data: In the lower extremity swelling and pain.  CHEST - 2 VIEW  Comparison: 12/05/2010.  Findings: The heart, mediastinum and hilar contours are normal. Right ventricular pacer wire with left sided generator.  The lungs are clear.  There is no effusion or pneumothorax.  There are no acute bony changes.  IMPRESSION: No active disease.   Original Report Authenticated By: Sander Radon, M.D.    Date: 01/02/2013  Rate: 58  Rhythm: junctional rythm with occasional paced beats  QRS Axis: left  Intervals: normal  ST/T Wave abnormalities: nonspecific T wave changes and ST depressions inferiorly  Conduction Disutrbances:incoplete right bundle branch block  Narrative Interpretation:   Old EKG Reviewed: changes noted new paced rhythm, afib resolved   2:10 PM Discussed with Dr. Mayford Knife, cardiology. Agrees with evaluation, will set up close follow up early next week.   1. CHF (congestive heart failure), chronic, diastolic   2. Cellulitis of toe of right foot     MDM  Pt with increased shortness of breath on exertion, right 2nd toe pain. Toe appears to have mild cellulitis most likely from a callus that is on that toe. Toe is tender. Good cap refill. Will start on clindamycin for infection. Pt's lungs on evaluation are clear. Susan [name] is not having any chest pain or sob at rest. CXR normal. LE swelling is noted, it is bilateral with no calf  tenderness, Susan [name] is on coumadin, doubt DVT. Suspect from mild fluid overload. Given 40mg  of lasix IV in ED. Plan to d/c home with close follow up. Pt discussed with Dr. Mayford Knife, discussed ECG findings, given pt has no cp, no sob at rest, unremarkable work up, will d/c home, Susan [name] will be set up by cardiology for close follow up.   Filed Vitals:   01/02/13 1213 01/02/13 1454  BP: 135/48 151/64  Pulse: 89 64  Temp: 97.5 F (36.4 C)   TempSrc: Oral   Resp: 18 19  Height: 5\' 7"  (1.702 m)   Weight: 147 lb (66.679 kg)   SpO2: 99% 100%      Leeum Sankey A Dejon Lukas, PA-C 01/02/13 1622

## 2013-01-02 NOTE — ED Notes (Signed)
MD at bedside. Ed md

## 2013-01-02 NOTE — ED Provider Notes (Signed)
Medical screening examination/treatment/procedure(s) were conducted as a shared visit with non-physician practitioner(s) and myself.  I personally evaluated the patient during the encounter.  79 year old female with a chief complaint of bilateral lower extremity edema and shortness of breath, particularly with exertion, over the past few days. No fevers or chills. No cough. Denies chest pain. No orthopnea. No urinary complaints. On exam patient is in no acute distress. Her lungs sound clear to me bilaterally. No increased work of breathing. Heart is irregularly irregular. I cannot appreciate a murmur. Abdomen is benign. Patient has symmetric bilateral lower extremity edema. Legs are symmetric as compared to each other. Patient does have a callus to her right middle toe. There some mild erythema around this and tenderness to palpation. No drainage. No fluctuance. Patient has palpable dorsalis pedis pulses bilaterally. Able to palpate PT pulse on R, although not L. feet appear well perfused.   I have some concerns for cardiac etiology of LE swelling and SOB.  Atrial fibrillation is known. Her EKG shows intermittent A. fib with occasional pacing. She has diffuse ST changes, most pronounced inferiorly. Patient is on digoxin, but I question whether this alone accounts for this. Rate has well controlled in ED up to this point. Plan CXR, basic labs, trop, dig level, inr.   Raeford Razor, MD 01/02/13 [phone]

## 2013-01-02 NOTE — ED Notes (Signed)
Pt presents with swelling to bilateral lower extremities and SHOB with excertion

## 2013-01-11 NOTE — ED Provider Notes (Signed)
Medical screening examination/treatment/procedure(s) were conducted as a shared visit with non-physician practitioner(s) and myself.  I personally evaluated the patient during the encounter.  Please see other note.   Raeford Razor, MD 01/11/13 [id number]

## 2013-01-14 DIAGNOSIS — L03119 Cellulitis of unspecified part of limb: Secondary | ICD-10-CM | POA: Diagnosis not present

## 2013-01-14 DIAGNOSIS — Z7901 Long term (current) use of anticoagulants: Secondary | ICD-10-CM | POA: Diagnosis not present

## 2013-01-14 DIAGNOSIS — L02419 Cutaneous abscess of limb, unspecified: Secondary | ICD-10-CM | POA: Diagnosis not present

## 2013-01-14 DIAGNOSIS — K219 Gastro-esophageal reflux disease without esophagitis: Secondary | ICD-10-CM | POA: Diagnosis not present

## 2013-01-14 DIAGNOSIS — IMO0002 Reserved for concepts with insufficient information to code with codable children: Secondary | ICD-10-CM | POA: Diagnosis not present

## 2013-01-14 DIAGNOSIS — R609 Edema, unspecified: Secondary | ICD-10-CM | POA: Diagnosis not present

## 2013-01-14 DIAGNOSIS — M1990 Unspecified osteoarthritis, unspecified site: Secondary | ICD-10-CM | POA: Diagnosis not present

## 2013-01-14 DIAGNOSIS — I509 Heart failure, unspecified: Secondary | ICD-10-CM | POA: Diagnosis not present

## 2013-01-14 DIAGNOSIS — R197 Diarrhea, unspecified: Secondary | ICD-10-CM | POA: Diagnosis not present

## 2013-01-18 DIAGNOSIS — I4891 Unspecified atrial fibrillation: Secondary | ICD-10-CM | POA: Diagnosis not present

## 2013-01-18 DIAGNOSIS — Z7901 Long term (current) use of anticoagulants: Secondary | ICD-10-CM | POA: Diagnosis not present

## 2013-01-22 ENCOUNTER — Ambulatory Visit (INDEPENDENT_AMBULATORY_CARE_PROVIDER_SITE_OTHER): Payer: Medicare Other | Admitting: Physician Assistant

## 2013-01-22 ENCOUNTER — Encounter: Payer: Self-pay | Admitting: Physician Assistant

## 2013-01-22 VITALS — BP 140/60 | HR 50 | Ht 67.0 in | Wt 141.0 lb

## 2013-01-22 DIAGNOSIS — I5032 Chronic diastolic (congestive) heart failure: Secondary | ICD-10-CM | POA: Diagnosis not present

## 2013-01-22 DIAGNOSIS — I10 Essential (primary) hypertension: Secondary | ICD-10-CM

## 2013-01-22 DIAGNOSIS — Z950 Presence of cardiac pacemaker: Secondary | ICD-10-CM

## 2013-01-22 DIAGNOSIS — I4891 Unspecified atrial fibrillation: Secondary | ICD-10-CM

## 2013-01-22 MED ORDER — DIGOXIN 125 MCG PO TABS: 0.1250 mg | ORAL_TABLET | Freq: Every day | ORAL | Status: AC

## 2013-01-22 NOTE — Patient Instructions (Signed)
Decrease digoxin(lanoxin) to 0.125mg  daily. This will be 1 of your 0.125mg  tablets daily.  Your physician wants you to follow-up in: November 2014 with Dr Swaziland.  You will receive a reminder letter in the mail two months in advance. If you don't receive a letter, please call our office to schedule the follow-up appointment.

## 2013-01-22 NOTE — Progress Notes (Signed)
[address] [place], [place]  [address] Phone: [phone] Fax:  [phone]  Date:  01/22/2013   ID:  Susan [name], DOB [birth year], MRN [id number]  PCP:  Hoyle Sauer, MD  Cardiologist:  Dr. Peter Swaziland    Electrophysiologist:  Dr. Hillis Range    History of Present Illness: Susan [name] is a 79 y.o. female who returns for f/u after a recent trip to the ED.  She has a history of diastolic CHF, atrial fibrillation, tachycardia-bradycardia syndrome, status post pacemaker implantation, HTN, CKD, remote stroke, CAD, dementia. Myoview 12/09: EF 68%, normal. Echocardiogram 12/11: EF 60-65%, MAC, moderate MR, moderate LAE, mild RVE, moderate TR, PASP 40. Last seen by Dr. Swaziland 11/2012. She had some orthostatic dizziness and her Lasix dose was reduced. Last seen by Dr. Johney Frame 11/2012.  She was seen in the emergency room 01/02/13 with increased dyspnea and right toe pain. She was treated for cellulitis with antibiotics. She was given IV Lasix for CHF.  She took extra Lasix for several days after going to the emergency room. Her weight and swelling came down. Her breathing is now back to normal. She is back to taking her usual dose of Lasix. She denies chest pain, syncope, orthopnea, PND. She denies significant dyspnea with exertion. She's probably NYHA class IIb.  Labs (6/14):  K 4.1, Cr 1.20, ALT 18, Alb 3.6, proBNP 2083, Hgb 11.7, INR 1.92, Dig 1.2  Wt Readings from Last 3 Encounters:  01/22/13 141 lb (63.957 kg)  01/02/13 147 lb (66.679 kg)  11/20/12 152 lb 1.9 oz (69.001 kg)     Past Medical History  Diagnosis Date  . Hypertension   . Asthma   . Dementia     mild  . Osteoarthritis   . Gallstones   . Chronic kidney disease     Stage 2 to 3  . Anemia   . Stroke     Remote R cerebellar stroke with gait abnormaility  . Atrial fibrillation     permanent  . Coronary artery disease   . Anticoagulant long-term use   . Chronic diastolic CHF (congestive  heart failure)   . Valvular heart disease     Mitral and tricuspid insufficiency  . Tachy-brady syndrome   . Pacemaker May 2012    VVI per Dr. Johney Frame    Current Outpatient Prescriptions  Medication Sig Dispense Refill  . Calcium Carbonate (CALTRATE 600 PO) Take by mouth 2 (two) times daily.       . Cholecalciferol (VITAMIN D-3 PO) Take 2,000 mg by mouth daily.       . Coenzyme Q10 (CO Q 10) 100 MG CAPS Take by mouth daily.      . Cyanocobalamin (VITAMIN B-12 PO) Take by mouth daily.      . diclofenac sodium (VOLTAREN) 1 % GEL Apply topically as needed.       . digoxin (LANOXIN) 0.125 MG tablet Take 0.125 mg by mouth as directed. Take 1 tablet one day and next day take 2 tablets      . docusate sodium (COLACE) 100 MG capsule Take 100 mg by mouth as needed.        . donepezil (ARICEPT) 10 MG tablet Take 10 mg by mouth at bedtime.        . fish oil-omega-3 fatty acids 1000 MG capsule Take 2 g by mouth daily.        . fluticasone (FLONASE) 50 MCG/ACT nasal spray 2 sprays by Nasal  route 2 (two) times daily as needed.        . Fluticasone-Salmeterol (ADVAIR DISKUS) 250-50 MCG/DOSE AEPB Inhale 1 puff into the lungs every 12 (twelve) hours.        . furosemide (LASIX) 40 MG tablet Take 20 mg by mouth 2 (two) times daily.       . Garlic 100 MG TABS Take by mouth.        . Glucosamine & Fish Oil 500-400-60-40 MG CAPS Take by mouth daily.       Marland Kitchen HYDROcodone-acetaminophen (NORCO) 10-325 MG per tablet Take 1 tablet by mouth every 6 (six) hours as needed.       . metoprolol succinate (TOPROL-XL) 100 MG 24 hr tablet Take 1 tablet (100 mg total) by mouth daily. Take with or immediately following a meal.  90 tablet  2  . montelukast (SINGULAIR) 10 MG tablet Take 10 mg by mouth at bedtime.        . Multiple Vitamins-Minerals (OCUVITE ADULT 50+ PO) Take by mouth daily.        Marland Kitchen warfarin (COUMADIN) 5 MG tablet Take 5 mg by mouth as directed.        . [DISCONTINUED] Dexlansoprazole (DEXILANT PO) Take by  mouth as needed.         No current facility-administered medications for this visit.    Allergies:    Allergies  Allergen Reactions  . Prednisone     Long term has reaction; felt like passing out  . Sulfa Antibiotics Nausea And Vomiting  . Amoxicillin Rash    Social History:  The patient  reports that she has never smoked. She does not have any smokeless tobacco history on file. She reports that she does not drink alcohol or use illicit drugs.   ROS:  Please see the history of present illness.      All other systems reviewed and negative.   PHYSICAL EXAM: VS:  BP 140/60  Pulse 50  Ht 5\' 7"  (1.702 m)  Wt 141 lb (63.957 kg)  BMI 22.08 kg/m2 Well nourished, well developed, in no acute distress HEENT: normal Neck: no JVD Cardiac:  normal S1, S2; RRR; no murmur Lungs:  clear to auscultation bilaterally, no wheezing, rhonchi or rales Abd: soft, nontender, no hepatomegaly Ext: no edema Skin: warm and dry Neuro:  CNs 2-12 intact, no focal abnormalities noted  EKG:  V. Paced, HR 50     ASSESSMENT AND PLAN:  1. Chronic Diastolic CHF:  Volume has returned to baseline. Continue current therapy. Recent basic metabolic panel with her PCP demonstrates normal potassium and creatinine.  She knows to weigh herself daily and to take extra Lasix prn. 2. Atrial Fibrillation: Coumadin managed by PCP. Ventricular rate is slow. Digoxin level somewhat elevated when she was in the emergency room. Given her advanced age, I have recommended she reduce her digoxin to 0.125 mg daily. 3. Hypertension: Controlled. 4. Status Post Pacemaker: Follow up with PCP as directed. 5. Disposition: Follow up with Dr. Swaziland in November as planned.  Signed, Tereso Newcomer, PA-C  01/22/2013 10:08 AM

## 2013-02-08 DIAGNOSIS — I4891 Unspecified atrial fibrillation: Secondary | ICD-10-CM | POA: Diagnosis not present

## 2013-02-08 DIAGNOSIS — Z7901 Long term (current) use of anticoagulants: Secondary | ICD-10-CM | POA: Diagnosis not present

## 2013-02-08 DIAGNOSIS — R05 Cough: Secondary | ICD-10-CM | POA: Diagnosis not present

## 2013-02-08 DIAGNOSIS — J301 Allergic rhinitis due to pollen: Secondary | ICD-10-CM | POA: Diagnosis not present

## 2013-02-22 ENCOUNTER — Encounter: Payer: Self-pay | Admitting: Internal Medicine

## 2013-02-22 ENCOUNTER — Ambulatory Visit (INDEPENDENT_AMBULATORY_CARE_PROVIDER_SITE_OTHER): Payer: Medicare Other | Admitting: *Deleted

## 2013-02-22 DIAGNOSIS — I495 Sick sinus syndrome: Secondary | ICD-10-CM | POA: Diagnosis not present

## 2013-02-22 DIAGNOSIS — Z950 Presence of cardiac pacemaker: Secondary | ICD-10-CM | POA: Diagnosis not present

## 2013-02-22 DIAGNOSIS — J309 Allergic rhinitis, unspecified: Secondary | ICD-10-CM | POA: Diagnosis not present

## 2013-02-23 LAB — REMOTE PACEMAKER DEVICE
BATTERY VOLTAGE: 3.01 V
BRDY-0002RV: 50 {beats}/min
DEVICE MODEL PM: 7222573

## 2013-03-09 ENCOUNTER — Encounter: Payer: Self-pay | Admitting: *Deleted

## 2013-03-09 DIAGNOSIS — I4891 Unspecified atrial fibrillation: Secondary | ICD-10-CM | POA: Diagnosis not present

## 2013-03-09 DIAGNOSIS — Z7901 Long term (current) use of anticoagulants: Secondary | ICD-10-CM | POA: Diagnosis not present

## 2013-03-09 DIAGNOSIS — J301 Allergic rhinitis due to pollen: Secondary | ICD-10-CM | POA: Diagnosis not present

## 2013-03-23 DIAGNOSIS — K219 Gastro-esophageal reflux disease without esophagitis: Secondary | ICD-10-CM | POA: Diagnosis not present

## 2013-03-23 DIAGNOSIS — J309 Allergic rhinitis, unspecified: Secondary | ICD-10-CM | POA: Diagnosis not present

## 2013-04-09 DIAGNOSIS — Z23 Encounter for immunization: Secondary | ICD-10-CM | POA: Diagnosis not present

## 2013-04-09 DIAGNOSIS — I4891 Unspecified atrial fibrillation: Secondary | ICD-10-CM | POA: Diagnosis not present

## 2013-04-09 DIAGNOSIS — I10 Essential (primary) hypertension: Secondary | ICD-10-CM | POA: Diagnosis not present

## 2013-04-09 DIAGNOSIS — N182 Chronic kidney disease, stage 2 (mild): Secondary | ICD-10-CM | POA: Diagnosis not present

## 2013-04-09 DIAGNOSIS — J45909 Unspecified asthma, uncomplicated: Secondary | ICD-10-CM | POA: Diagnosis not present

## 2013-04-09 DIAGNOSIS — IMO0002 Reserved for concepts with insufficient information to code with codable children: Secondary | ICD-10-CM | POA: Diagnosis not present

## 2013-04-09 DIAGNOSIS — J301 Allergic rhinitis due to pollen: Secondary | ICD-10-CM | POA: Diagnosis not present

## 2013-04-09 DIAGNOSIS — Z7901 Long term (current) use of anticoagulants: Secondary | ICD-10-CM | POA: Diagnosis not present

## 2013-04-22 ENCOUNTER — Telehealth: Payer: Self-pay | Admitting: Internal Medicine

## 2013-04-22 NOTE — Telephone Encounter (Signed)
New message    Pt thinks she is supposed to see Dr Johney Frame in nov--however, she has an appt with Dr Swaziland in nov.  Will she need to see both physicians?

## 2013-04-22 NOTE — Telephone Encounter (Signed)
Not due to see Dr Johney Frame until 11/2013  Spoke with patient and let her know to keep her appointment with Dr Swaziland in Nov

## 2013-05-11 DIAGNOSIS — Z7901 Long term (current) use of anticoagulants: Secondary | ICD-10-CM | POA: Diagnosis not present

## 2013-05-11 DIAGNOSIS — I4891 Unspecified atrial fibrillation: Secondary | ICD-10-CM | POA: Diagnosis not present

## 2013-05-26 ENCOUNTER — Encounter: Payer: Self-pay | Admitting: Cardiology

## 2013-05-26 ENCOUNTER — Ambulatory Visit (INDEPENDENT_AMBULATORY_CARE_PROVIDER_SITE_OTHER): Payer: Medicare Other | Admitting: Cardiology

## 2013-05-26 VITALS — BP 140/62 | HR 50 | Ht 67.0 in | Wt 148.0 lb

## 2013-05-26 DIAGNOSIS — I495 Sick sinus syndrome: Secondary | ICD-10-CM

## 2013-05-26 DIAGNOSIS — I4891 Unspecified atrial fibrillation: Secondary | ICD-10-CM | POA: Diagnosis not present

## 2013-05-26 DIAGNOSIS — I509 Heart failure, unspecified: Secondary | ICD-10-CM | POA: Diagnosis not present

## 2013-05-26 DIAGNOSIS — I5032 Chronic diastolic (congestive) heart failure: Secondary | ICD-10-CM

## 2013-05-26 NOTE — Patient Instructions (Signed)
Continue your current therapy and sodium restriction  I will see you in 6 months.   

## 2013-05-26 NOTE — Progress Notes (Signed)
Susan [name] Date of Birth: 20-Nov-1920   History of Present Illness: Susan [name] is seen back today for followup. She has a history of permanent atrial fibrillation. She has a pacemaker in place. In general she is doing quite well. She just turned 79 years old. She does note some mild swelling in her ankles. She does get short of breath if she bends over or she walks any long distances. She reports that her weight has been stable. She hasn't had to take any extra Lasix for some time. Her pacemaker evaluation in August was satisfactory. Her Coumadin has been checking out okay.  Current Outpatient Prescriptions on File Prior to Visit  Medication Sig Dispense Refill  . Calcium Carbonate (CALTRATE 600 PO) Take by mouth 2 (two) times daily.       . Cholecalciferol (VITAMIN D-3 PO) Take 2,000 mg by mouth daily.       . Coenzyme Q10 (CO Q 10) 100 MG CAPS Take by mouth daily.      . Cyanocobalamin (VITAMIN B-12 PO) Take by mouth daily.      . diclofenac sodium (VOLTAREN) 1 % GEL Apply topically as needed.       . digoxin (LANOXIN) 0.125 MG tablet Take 1 tablet (0.125 mg total) by mouth daily.  90 tablet  1  . docusate sodium (COLACE) 100 MG capsule Take 100 mg by mouth as needed.        . donepezil (ARICEPT) 10 MG tablet Take 10 mg by mouth at bedtime.        . fish oil-omega-3 fatty acids 1000 MG capsule Take 2 g by mouth daily.        . fluticasone (FLONASE) 50 MCG/ACT nasal spray 2 sprays by Nasal route 2 (two) times daily as needed.        . Fluticasone-Salmeterol (ADVAIR DISKUS) 250-50 MCG/DOSE AEPB Inhale 1 puff into the lungs every 12 (twelve) hours.        . Garlic 100 MG TABS Take by mouth.        . Glucosamine & Fish Oil 500-400-60-40 MG CAPS Take by mouth daily.       Marland Kitchen HYDROcodone-acetaminophen (NORCO) 10-325 MG per tablet Take 1 tablet by mouth every 6 (six) hours as needed.       . metoprolol succinate (TOPROL-XL) 100 MG 24 hr tablet Take 1 tablet (100 mg total) by mouth daily. Take  with or immediately following a meal.  90 tablet  2  . montelukast (SINGULAIR) 10 MG tablet Take 10 mg by mouth at bedtime.        . Multiple Vitamins-Minerals (OCUVITE ADULT 50+ PO) Take by mouth daily.        Marland Kitchen warfarin (COUMADIN) 5 MG tablet Take 5 mg by mouth as directed.        . [DISCONTINUED] Dexlansoprazole (DEXILANT PO) Take by mouth as needed.         No current facility-administered medications on file prior to visit.    Allergies  Allergen Reactions  . Prednisone     Long term has reaction; felt like passing out  . Sulfa Antibiotics Nausea And Vomiting  . Amoxicillin Rash    Past Medical History  Diagnosis Date  . Hypertension   . Asthma   . Dementia     mild  . Osteoarthritis   . Gallstones   . Chronic kidney disease     Stage 2 to 3  . Anemia   . Stroke  Remote R cerebellar stroke with gait abnormaility  . Atrial fibrillation     permanent  . Coronary artery disease   . Anticoagulant long-term use   . Chronic diastolic CHF (congestive heart failure)   . Valvular heart disease     Mitral and tricuspid insufficiency  . Tachy-brady syndrome   . Pacemaker May 2012    VVI per Dr. Johney Frame    Past Surgical History  Procedure Laterality Date  . Abdominal hysterectomy      total  . Appendectomy    . Hemorrhoid surgery    . Cataract extraction, bilateral    . Total knee arthroplasty    . Cholecystectomy    . Pacemaker insertion  May 2012    St Jude Accent SR RF implant. Dr. Johney Frame    History  Smoking status  . Never Smoker   Smokeless tobacco  . Not on file    History  Alcohol Use No    Family History  Problem Relation Age of Onset  . Clotting disorder Mother   . Suicidality Father     Review of Systems: The review of systems is as above.  All other systems were reviewed and are negative.  Physical Exam: BP 140/62  Pulse 50  Ht 5\' 7"  (1.702 m)  Wt 148 lb (67.132 kg)  BMI 23.17 kg/m2 Patient is very pleasant and in no acute  distress. She looks younger than her stated age. Skin is warm and dry. Color is normal.  HEENT is unremarkable. Normocephalic/atraumatic. PERRL. Sclera are nonicteric. Neck is supple. No masses. No JVD. Lungs are clear. Cardiac exam shows an irregular rate and rhythm. Normal S1 and S2. No gallops or murmur. Abdomen is soft. Extremities reveal trace edema. Gait and ROM are intact. No gross neurologic deficits noted.  LABORATORY DATA:    Assessment / Plan: 1. Atrial fibrillation, permanent. Managed with rate control and anticoagulation. Continue current dose of digoxin and metoprolol. Coumadin is managed at [facility]. 2. Chronic congestive heart failure with diastolic dysfunction. She has class II symptoms. Weight is stable. Continue Lasix 20 mg per day. She may take an extra dose if she is retaining fluid her increasing weight. 3. Sick sinus syndrome status post pacemaker implant in May of 2012. Followed in our pacemaker clinic. 4. Orthostatic dizziness.  5. Dyspnea. This is multifactorial including history of asthma, diastolic heart failure, and deconditioning.

## 2013-05-31 ENCOUNTER — Other Ambulatory Visit: Payer: Self-pay | Admitting: Internal Medicine

## 2013-05-31 ENCOUNTER — Ambulatory Visit (INDEPENDENT_AMBULATORY_CARE_PROVIDER_SITE_OTHER): Payer: Medicare Other | Admitting: *Deleted

## 2013-05-31 DIAGNOSIS — I495 Sick sinus syndrome: Secondary | ICD-10-CM

## 2013-05-31 DIAGNOSIS — I4891 Unspecified atrial fibrillation: Secondary | ICD-10-CM

## 2013-06-06 LAB — MDC_IDC_ENUM_SESS_TYPE_REMOTE
Battery Remaining Longevity: 143 mo
Implantable Pulse Generator Serial Number: 7222573
Lead Channel Sensing Intrinsic Amplitude: 12 mV
Lead Channel Setting Pacing Amplitude: 2.5 V
Lead Channel Setting Pacing Pulse Width: 0.4 ms

## 2013-06-10 DIAGNOSIS — I4891 Unspecified atrial fibrillation: Secondary | ICD-10-CM | POA: Diagnosis not present

## 2013-06-10 DIAGNOSIS — Z7901 Long term (current) use of anticoagulants: Secondary | ICD-10-CM | POA: Diagnosis not present

## 2013-06-15 ENCOUNTER — Encounter: Payer: Self-pay | Admitting: *Deleted

## 2013-07-05 ENCOUNTER — Encounter: Payer: Self-pay | Admitting: Internal Medicine

## 2013-07-12 DIAGNOSIS — I4891 Unspecified atrial fibrillation: Secondary | ICD-10-CM | POA: Diagnosis not present

## 2013-07-12 DIAGNOSIS — R05 Cough: Secondary | ICD-10-CM | POA: Diagnosis not present

## 2013-07-12 DIAGNOSIS — Z7901 Long term (current) use of anticoagulants: Secondary | ICD-10-CM | POA: Diagnosis not present

## 2013-07-12 DIAGNOSIS — R059 Cough, unspecified: Secondary | ICD-10-CM | POA: Diagnosis not present

## 2013-07-12 DIAGNOSIS — J189 Pneumonia, unspecified organism: Secondary | ICD-10-CM | POA: Diagnosis not present

## 2013-07-12 DIAGNOSIS — I509 Heart failure, unspecified: Secondary | ICD-10-CM | POA: Diagnosis not present

## 2013-07-15 DIAGNOSIS — Z7901 Long term (current) use of anticoagulants: Secondary | ICD-10-CM | POA: Diagnosis not present

## 2013-07-15 DIAGNOSIS — K219 Gastro-esophageal reflux disease without esophagitis: Secondary | ICD-10-CM | POA: Diagnosis not present

## 2013-07-15 DIAGNOSIS — J189 Pneumonia, unspecified organism: Secondary | ICD-10-CM | POA: Diagnosis not present

## 2013-07-15 DIAGNOSIS — I4891 Unspecified atrial fibrillation: Secondary | ICD-10-CM | POA: Diagnosis not present

## 2013-07-15 DIAGNOSIS — R5381 Other malaise: Secondary | ICD-10-CM | POA: Diagnosis not present

## 2013-07-19 DIAGNOSIS — R05 Cough: Secondary | ICD-10-CM | POA: Diagnosis not present

## 2013-07-19 DIAGNOSIS — Z7901 Long term (current) use of anticoagulants: Secondary | ICD-10-CM | POA: Diagnosis not present

## 2013-07-19 DIAGNOSIS — J189 Pneumonia, unspecified organism: Secondary | ICD-10-CM | POA: Diagnosis not present

## 2013-07-19 DIAGNOSIS — R609 Edema, unspecified: Secondary | ICD-10-CM | POA: Diagnosis not present

## 2013-07-19 DIAGNOSIS — R059 Cough, unspecified: Secondary | ICD-10-CM | POA: Diagnosis not present

## 2013-07-19 DIAGNOSIS — I4891 Unspecified atrial fibrillation: Secondary | ICD-10-CM | POA: Diagnosis not present

## 2013-07-19 DIAGNOSIS — I10 Essential (primary) hypertension: Secondary | ICD-10-CM | POA: Diagnosis not present

## 2013-07-27 DIAGNOSIS — Z7901 Long term (current) use of anticoagulants: Secondary | ICD-10-CM | POA: Diagnosis not present

## 2013-07-27 DIAGNOSIS — R609 Edema, unspecified: Secondary | ICD-10-CM | POA: Diagnosis not present

## 2013-07-27 DIAGNOSIS — J189 Pneumonia, unspecified organism: Secondary | ICD-10-CM | POA: Diagnosis not present

## 2013-07-27 DIAGNOSIS — I509 Heart failure, unspecified: Secondary | ICD-10-CM | POA: Diagnosis not present

## 2013-07-27 DIAGNOSIS — I4891 Unspecified atrial fibrillation: Secondary | ICD-10-CM | POA: Diagnosis not present

## 2013-08-12 DIAGNOSIS — Z7901 Long term (current) use of anticoagulants: Secondary | ICD-10-CM | POA: Diagnosis not present

## 2013-08-12 DIAGNOSIS — R609 Edema, unspecified: Secondary | ICD-10-CM | POA: Diagnosis not present

## 2013-08-12 DIAGNOSIS — I10 Essential (primary) hypertension: Secondary | ICD-10-CM | POA: Diagnosis not present

## 2013-08-12 DIAGNOSIS — IMO0002 Reserved for concepts with insufficient information to code with codable children: Secondary | ICD-10-CM | POA: Diagnosis not present

## 2013-08-12 DIAGNOSIS — J45909 Unspecified asthma, uncomplicated: Secondary | ICD-10-CM | POA: Diagnosis not present

## 2013-08-12 DIAGNOSIS — I519 Heart disease, unspecified: Secondary | ICD-10-CM | POA: Diagnosis not present

## 2013-08-12 DIAGNOSIS — M1990 Unspecified osteoarthritis, unspecified site: Secondary | ICD-10-CM | POA: Diagnosis not present

## 2013-08-26 DIAGNOSIS — R634 Abnormal weight loss: Secondary | ICD-10-CM | POA: Diagnosis not present

## 2013-08-26 DIAGNOSIS — R5383 Other fatigue: Secondary | ICD-10-CM | POA: Diagnosis not present

## 2013-08-26 DIAGNOSIS — N182 Chronic kidney disease, stage 2 (mild): Secondary | ICD-10-CM | POA: Diagnosis not present

## 2013-08-26 DIAGNOSIS — R5381 Other malaise: Secondary | ICD-10-CM | POA: Diagnosis not present

## 2013-08-26 DIAGNOSIS — Z7901 Long term (current) use of anticoagulants: Secondary | ICD-10-CM | POA: Diagnosis not present

## 2013-08-26 DIAGNOSIS — D539 Nutritional anemia, unspecified: Secondary | ICD-10-CM | POA: Diagnosis not present

## 2013-08-26 DIAGNOSIS — I4891 Unspecified atrial fibrillation: Secondary | ICD-10-CM | POA: Diagnosis not present

## 2013-09-01 ENCOUNTER — Ambulatory Visit (INDEPENDENT_AMBULATORY_CARE_PROVIDER_SITE_OTHER): Payer: Medicare Other | Admitting: *Deleted

## 2013-09-01 DIAGNOSIS — I4891 Unspecified atrial fibrillation: Secondary | ICD-10-CM | POA: Diagnosis not present

## 2013-09-01 DIAGNOSIS — I495 Sick sinus syndrome: Secondary | ICD-10-CM

## 2013-09-01 LAB — MDC_IDC_ENUM_SESS_TYPE_REMOTE
Date Time Interrogation Session: 20150225070014
Implantable Pulse Generator Model: 1210
Lead Channel Impedance Value: 590 Ohm
Lead Channel Sensing Intrinsic Amplitude: 12 mV
Lead Channel Setting Pacing Amplitude: 2.5 V
Lead Channel Setting Pacing Pulse Width: 0.4 ms
MDC IDC MSMT BATTERY REMAINING LONGEVITY: 143 mo
MDC IDC MSMT BATTERY VOLTAGE: 2.99 V
MDC IDC MSMT LEADCHNL RV PACING THRESHOLD AMPLITUDE: 1 V
MDC IDC MSMT LEADCHNL RV PACING THRESHOLD PULSEWIDTH: 0.4 ms
MDC IDC PG SERIAL: 7222573
MDC IDC SET LEADCHNL RV SENSING SENSITIVITY: 2 mV
MDC IDC STAT BRADY RV PERCENT PACED: 39 %

## 2013-09-06 ENCOUNTER — Encounter: Payer: Self-pay | Admitting: *Deleted

## 2013-09-15 ENCOUNTER — Encounter: Payer: Self-pay | Admitting: Internal Medicine

## 2013-09-23 DIAGNOSIS — Z7901 Long term (current) use of anticoagulants: Secondary | ICD-10-CM | POA: Diagnosis not present

## 2013-09-23 DIAGNOSIS — I4891 Unspecified atrial fibrillation: Secondary | ICD-10-CM | POA: Diagnosis not present

## 2013-10-20 DIAGNOSIS — Z961 Presence of intraocular lens: Secondary | ICD-10-CM | POA: Diagnosis not present

## 2013-10-20 DIAGNOSIS — H52209 Unspecified astigmatism, unspecified eye: Secondary | ICD-10-CM | POA: Diagnosis not present

## 2013-10-20 DIAGNOSIS — H47099 Other disorders of optic nerve, not elsewhere classified, unspecified eye: Secondary | ICD-10-CM | POA: Diagnosis not present

## 2013-10-25 DIAGNOSIS — Z7901 Long term (current) use of anticoagulants: Secondary | ICD-10-CM | POA: Diagnosis not present

## 2013-10-25 DIAGNOSIS — I4891 Unspecified atrial fibrillation: Secondary | ICD-10-CM | POA: Diagnosis not present

## 2013-11-23 DIAGNOSIS — F0280 Dementia in other diseases classified elsewhere without behavioral disturbance: Secondary | ICD-10-CM | POA: Diagnosis not present

## 2013-11-23 DIAGNOSIS — M1710 Unilateral primary osteoarthritis, unspecified knee: Secondary | ICD-10-CM | POA: Diagnosis not present

## 2013-11-23 DIAGNOSIS — N182 Chronic kidney disease, stage 2 (mild): Secondary | ICD-10-CM | POA: Diagnosis not present

## 2013-11-23 DIAGNOSIS — Z7901 Long term (current) use of anticoagulants: Secondary | ICD-10-CM | POA: Diagnosis not present

## 2013-11-23 DIAGNOSIS — G309 Alzheimer's disease, unspecified: Secondary | ICD-10-CM | POA: Diagnosis not present

## 2013-11-23 DIAGNOSIS — I519 Heart disease, unspecified: Secondary | ICD-10-CM | POA: Diagnosis not present

## 2013-11-23 DIAGNOSIS — K219 Gastro-esophageal reflux disease without esophagitis: Secondary | ICD-10-CM | POA: Diagnosis not present

## 2013-11-23 DIAGNOSIS — I10 Essential (primary) hypertension: Secondary | ICD-10-CM | POA: Diagnosis not present

## 2013-11-23 DIAGNOSIS — IMO0002 Reserved for concepts with insufficient information to code with codable children: Secondary | ICD-10-CM | POA: Diagnosis not present

## 2013-11-24 ENCOUNTER — Ambulatory Visit (INDEPENDENT_AMBULATORY_CARE_PROVIDER_SITE_OTHER): Payer: Medicare Other | Admitting: Internal Medicine

## 2013-11-24 VITALS — BP 160/70 | HR 64 | Ht 67.0 in | Wt 144.0 lb

## 2013-11-24 DIAGNOSIS — I4891 Unspecified atrial fibrillation: Secondary | ICD-10-CM

## 2013-11-24 DIAGNOSIS — I495 Sick sinus syndrome: Secondary | ICD-10-CM

## 2013-11-24 LAB — MDC_IDC_ENUM_SESS_TYPE_INCLINIC
Battery Remaining Longevity: 146.4 mo
Battery Voltage: 2.99 V
Implantable Pulse Generator Model: 1210
Lead Channel Pacing Threshold Amplitude: 1 V
Lead Channel Pacing Threshold Pulse Width: 0.4 ms
MDC IDC MSMT LEADCHNL RV IMPEDANCE VALUE: 562.5 Ohm
MDC IDC MSMT LEADCHNL RV SENSING INTR AMPL: 12 mV
MDC IDC PG SERIAL: 7222573
MDC IDC SESS DTM: 20150520121140
MDC IDC SET LEADCHNL RV PACING AMPLITUDE: 2.5 V
MDC IDC SET LEADCHNL RV PACING PULSEWIDTH: 0.4 ms
MDC IDC SET LEADCHNL RV SENSING SENSITIVITY: 2 mV
MDC IDC STAT BRADY RV PERCENT PACED: 38 %

## 2013-11-24 NOTE — Progress Notes (Signed)
PCP: Hoyle SauerAVVA,RAVISANKAR R, MD Primary Cardiologist:  Dr SwazilandJordan  The patient presents today for routine electrophysiology followup.  Since last being seen in our clinic, the patient reports doing reasonably well.   She remains very active for her age.  Today, she denies symptoms of palpitations, chest pain,  orthopnea, PND, lower extremity edema, presyncope, syncope, or neurologic sequela.  The patient feels that she is tolerating medications without difficulties and is otherwise without complaint today.   Past Medical History  Diagnosis Date  . Hypertension   . Asthma   . Dementia     mild  . Osteoarthritis   . Gallstones   . Chronic kidney disease     Stage 2 to 3  . Anemia   . Stroke     Remote R cerebellar stroke with gait abnormaility  . Atrial fibrillation     permanent  . Coronary artery disease   . Anticoagulant long-term use   . Chronic diastolic CHF (congestive heart failure)   . Valvular heart disease     Mitral and tricuspid insufficiency  . Tachy-brady syndrome   . Pacemaker May 2012    VVI per Dr. Johney FrameAllred   Past Surgical History  Procedure Laterality Date  . Abdominal hysterectomy      total  . Appendectomy    . Hemorrhoid surgery    . Cataract extraction, bilateral    . Total knee arthroplasty    . Cholecystectomy    . Pacemaker insertion  May 2012    St Jude Accent SR RF implant. Dr. Johney FrameAllred    Current Outpatient Prescriptions  Medication Sig Dispense Refill  . Calcium Carbonate (CALTRATE 600 PO) Take 1 capsule by mouth 2 (two) times daily.       . Cholecalciferol (VITAMIN D-3 PO) Take 2,000 mg by mouth daily.       . Cyanocobalamin (VITAMIN B-12 PO) Take 1 capsule by mouth daily.       . digoxin (LANOXIN) 0.125 MG tablet Take 1 tablet (0.125 mg total) by mouth daily.  90 tablet  1  . docusate sodium (COLACE) 100 MG capsule Take 100 mg by mouth as needed.        . donepezil (ARICEPT) 10 MG tablet Take 10 mg by mouth at bedtime.        . fish oil-omega-3  fatty acids 1000 MG capsule Take 2 g by mouth daily.        . fluticasone (FLONASE) 50 MCG/ACT nasal spray 2 sprays by Nasal route 2 (two) times daily as needed.        . Fluticasone-Salmeterol (ADVAIR DISKUS) 250-50 MCG/DOSE AEPB Inhale 1 puff into the lungs every 12 (twelve) hours.        . furosemide (LASIX) 20 MG tablet Take 20 mg by mouth.      . Garlic 100 MG TABS Take 1 tablet by mouth daily.       Marland Kitchen. HYDROcodone-acetaminophen (NORCO) 10-325 MG per tablet Take 1 tablet by mouth every 6 (six) hours as needed.       Marland Kitchen. ipratropium (ATROVENT) 0.06 % nasal spray Place 1 spray into both nostrils as directed.       . metoprolol succinate (TOPROL-XL) 100 MG 24 hr tablet Take 1 tablet (100 mg total) by mouth daily. Take with or immediately following a meal.  90 tablet  2  . montelukast (SINGULAIR) 10 MG tablet Take 10 mg by mouth at bedtime.        . Multiple Vitamins-Minerals (  OCUVITE ADULT 50+ PO) Take 1 capsule by mouth daily.       Marland Kitchen. warfarin (COUMADIN) 5 MG tablet Take 5 mg by mouth as directed.        . [DISCONTINUED] Dexlansoprazole (DEXILANT PO) Take by mouth as needed.         No current facility-administered medications for this visit.    Allergies  Allergen Reactions  . Prednisone     Long term has reaction; felt like passing out  . Sulfa Antibiotics Nausea And Vomiting  . Amoxicillin Rash    History   Social History  . Marital Status: Widowed    Spouse Name: N/A    Number of Children: N/A  . Years of Education: N/A   Occupational History  . Not on file.   Social History Main Topics  . Smoking status: Never Smoker   . Smokeless tobacco: Not on file  . Alcohol Use: No  . Drug Use: No  . Sexual Activity: Not on file   Other Topics Concern  . Not on file   Social History Narrative   Pt lives in [place] with daughter.  Attends [employer]   Retired from an [employer].    Family History  Problem Relation Age of Onset  . Clotting  disorder Mother   . Suicidality Father     Physical Exam: Filed Vitals:   11/24/13 1200  BP: 160/70  Pulse: 64  Height: 5\' 7"  (1.702 m)  Weight: 144 lb (65.318 kg)    GEN- The patient is well appearing, alert and oriented x 3 today.   Head- normocephalic, atraumatic Eyes-  Sclera clear, conjunctiva pink Ears- hearing intact Oropharynx- clear Neck- supple,   Lungs- Clear to ausculation bilaterally, normal work of breathing Chest- pacemaker pocket is well healed Heart- irregular rate and rhythm, no murmurs, rubs or gallops, PMI not laterally displaced GI- soft, NT, ND, + BS Extremities- no clubbing, cyanosis, or edema  Pacemaker interrogation- reviewed in detail today,  See PACEART report  Assessment and Plan:  1. Tachycardia/ bradycardia syndrome Normal pacemaker function See Pace Art report No changes today  2. Permanent atrial fibrillation Rate controlled Continue coumadin long term  Merlin compliant Follow-up with Brooke in the device clinic in 1 year

## 2013-11-24 NOTE — Patient Instructions (Signed)
Remote monitoring is used to monitor your Pacemaker of ICD from home. This monitoring reduces the number of office visits required to check your device to one time per year. It allows us to keep an eye on the functioning of your device to ensure it is working properly. You are scheduled for a device check from home on 02/28/14. You may send your transmission at any time that day. If you have a wireless device, the transmission will be sent automatically. After your physician reviews your transmission, you will receive a postcard with your next transmission date.    Your physician wants you to follow-up in: 12 months with Susan KuBrooke Edmisten,PA You will receive a reminder letter in the mail two months in advance. If you don't receive a letter, please call our office to schedule the follow-up appointment.

## 2013-11-26 ENCOUNTER — Encounter: Payer: Self-pay | Admitting: Cardiology

## 2013-11-26 ENCOUNTER — Encounter: Payer: Self-pay | Admitting: Internal Medicine

## 2013-12-02 ENCOUNTER — Encounter: Payer: Self-pay | Admitting: Cardiology

## 2013-12-02 ENCOUNTER — Ambulatory Visit (INDEPENDENT_AMBULATORY_CARE_PROVIDER_SITE_OTHER): Payer: Medicare Other | Admitting: Cardiology

## 2013-12-02 VITALS — BP 130/64 | HR 52 | Ht 67.0 in | Wt 145.0 lb

## 2013-12-02 DIAGNOSIS — I5032 Chronic diastolic (congestive) heart failure: Secondary | ICD-10-CM | POA: Diagnosis not present

## 2013-12-02 DIAGNOSIS — I509 Heart failure, unspecified: Secondary | ICD-10-CM | POA: Diagnosis not present

## 2013-12-02 DIAGNOSIS — I10 Essential (primary) hypertension: Secondary | ICD-10-CM | POA: Diagnosis not present

## 2013-12-02 DIAGNOSIS — I4891 Unspecified atrial fibrillation: Secondary | ICD-10-CM

## 2013-12-02 NOTE — Patient Instructions (Signed)
Continue your current therapy  I will see you in 6 months.   

## 2013-12-02 NOTE — Progress Notes (Signed)
Susan CaveMarie A [name] Date of Birth: 03/16/1921   History of Present Illness: Susan [name] is seen back today for followup. She has a history of permanent atrial fibrillation. She has a pacemaker in place. In general she is doing quite well. She remains active with church and walking. No chest pain or dyspnea. No dizziness or palpitations. Coumadin has been therapeutic.   Current Outpatient Prescriptions on File Prior to Visit  Medication Sig Dispense Refill  . Calcium Carbonate (CALTRATE 600 PO) Take 1 capsule by mouth 2 (two) times daily.       . Cyanocobalamin (VITAMIN B-12 PO) Take 1 capsule by mouth daily.       . digoxin (LANOXIN) 0.125 MG tablet Take 1 tablet (0.125 mg total) by mouth daily.  90 tablet  1  . docusate sodium (COLACE) 100 MG capsule Take 100 mg by mouth as needed.        . donepezil (ARICEPT) 10 MG tablet Take 10 mg by mouth at bedtime.        . fish oil-omega-3 fatty acids 1000 MG capsule Take 2 g by mouth daily.        . fluticasone (FLONASE) 50 MCG/ACT nasal spray Place 2 sprays into the nose daily.       . Fluticasone-Salmeterol (ADVAIR DISKUS) 250-50 MCG/DOSE AEPB Inhale 1 puff into the lungs every 12 (twelve) hours.        . furosemide (LASIX) 20 MG tablet Take 20 mg by mouth.      . Garlic 100 MG TABS Take 1 tablet by mouth daily.       Marland Kitchen. HYDROcodone-acetaminophen (NORCO) 10-325 MG per tablet Take 1 tablet by mouth every 6 (six) hours as needed.       Marland Kitchen. ipratropium (ATROVENT) 0.06 % nasal spray Place 1 spray into both nostrils as directed.       . metoprolol succinate (TOPROL-XL) 100 MG 24 hr tablet Take 1 tablet (100 mg total) by mouth daily. Take with or immediately following a meal.  90 tablet  2  . montelukast (SINGULAIR) 10 MG tablet Take 10 mg by mouth at bedtime.        . Multiple Vitamins-Minerals (OCUVITE ADULT 50+ PO) Take 1 capsule by mouth daily.       Marland Kitchen. warfarin (COUMADIN) 5 MG tablet Take 5 mg by mouth as directed.        . [DISCONTINUED] Dexlansoprazole  (DEXILANT PO) Take by mouth as needed.         No current facility-administered medications on file prior to visit.    Allergies  Allergen Reactions  . Prednisone     Long term has reaction; felt like passing out  . Sulfa Antibiotics Nausea And Vomiting  . Amoxicillin Rash    Past Medical History  Diagnosis Date  . Hypertension   . Asthma   . Dementia     mild  . Osteoarthritis   . Gallstones   . Chronic kidney disease     Stage 2 to 3  . Anemia   . Stroke     Remote R cerebellar stroke with gait abnormaility  . Atrial fibrillation     permanent  . Coronary artery disease   . Anticoagulant long-term use   . Chronic diastolic CHF (congestive heart failure)   . Valvular heart disease     Mitral and tricuspid insufficiency  . Tachy-brady syndrome   . Pacemaker May 2012    VVI per Dr. Johney FrameAllred  Past Surgical History  Procedure Laterality Date  . Abdominal hysterectomy      total  . Appendectomy    . Hemorrhoid surgery    . Cataract extraction, bilateral    . Total knee arthroplasty    . Cholecystectomy    . Pacemaker insertion  May 2012    St Jude Accent SR RF implant. Dr. Johney Frame    History  Smoking status  . Never Smoker   Smokeless tobacco  . Not on file    History  Alcohol Use No    Family History  Problem Relation Age of Onset  . Clotting disorder Mother   . Suicidality Father     Review of Systems: The review of systems is as above.  All other systems were reviewed and are negative.  Physical Exam: BP 130/64  Pulse 52  Ht 5\' 7"  (1.702 m)  Wt 145 lb (65.772 kg)  BMI 22.71 kg/m2  SpO2 95% Patient is very pleasant and in no acute distress. She looks younger than her stated age. Skin is warm and dry. Color is normal.  HEENT is unremarkable. Normocephalic/atraumatic. PERRL. Sclera are nonicteric. Neck is supple. No masses. No JVD. Lungs are clear. Cardiac exam shows an irregular rate and rhythm. Normal S1 and S2. No gallops or murmur. Abdomen  is soft. Extremities reveal trace edema. Gait and ROM are intact. No gross neurologic deficits noted.  LABORATORY DATA:    Assessment / Plan: 1. Atrial fibrillation, permanent. Managed with rate control and anticoagulation. Continue current dose of digoxin and metoprolol. [facility] at [facility].  2. Chronic congestive heart failure with diastolic dysfunction. No significant edema. Weight is stable. Continue Lasix 20 mg per day. She may take an extra dose if she is retaining fluid her increasing weight.  3. Sick sinus syndrome status post pacemaker implant in May of 2012. Followed in our pacemaker clinic. Last check earlier this month was satisfactory.

## 2013-12-21 DIAGNOSIS — F0280 Dementia in other diseases classified elsewhere without behavioral disturbance: Secondary | ICD-10-CM | POA: Diagnosis not present

## 2013-12-21 DIAGNOSIS — J301 Allergic rhinitis due to pollen: Secondary | ICD-10-CM | POA: Diagnosis not present

## 2013-12-21 DIAGNOSIS — I4891 Unspecified atrial fibrillation: Secondary | ICD-10-CM | POA: Diagnosis not present

## 2013-12-21 DIAGNOSIS — IMO0002 Reserved for concepts with insufficient information to code with codable children: Secondary | ICD-10-CM | POA: Diagnosis not present

## 2013-12-21 DIAGNOSIS — Z7901 Long term (current) use of anticoagulants: Secondary | ICD-10-CM | POA: Diagnosis not present

## 2013-12-21 DIAGNOSIS — G309 Alzheimer's disease, unspecified: Secondary | ICD-10-CM | POA: Diagnosis not present

## 2014-01-18 DIAGNOSIS — I4891 Unspecified atrial fibrillation: Secondary | ICD-10-CM | POA: Diagnosis not present

## 2014-01-18 DIAGNOSIS — Z7901 Long term (current) use of anticoagulants: Secondary | ICD-10-CM | POA: Diagnosis not present

## 2014-02-15 DIAGNOSIS — J301 Allergic rhinitis due to pollen: Secondary | ICD-10-CM | POA: Diagnosis not present

## 2014-02-15 DIAGNOSIS — I4891 Unspecified atrial fibrillation: Secondary | ICD-10-CM | POA: Diagnosis not present

## 2014-02-15 DIAGNOSIS — Z7901 Long term (current) use of anticoagulants: Secondary | ICD-10-CM | POA: Diagnosis not present

## 2014-02-28 ENCOUNTER — Ambulatory Visit (INDEPENDENT_AMBULATORY_CARE_PROVIDER_SITE_OTHER): Payer: Medicare Other | Admitting: *Deleted

## 2014-02-28 ENCOUNTER — Encounter: Payer: Self-pay | Admitting: Internal Medicine

## 2014-02-28 DIAGNOSIS — I495 Sick sinus syndrome: Secondary | ICD-10-CM

## 2014-02-28 NOTE — Progress Notes (Signed)
Remote pacemaker transmission.   

## 2014-03-02 LAB — MDC_IDC_ENUM_SESS_TYPE_REMOTE
Battery Remaining Longevity: 124 mo
Battery Remaining Percentage: 86 %
Brady Statistic RV Percent Paced: 45 %
Implantable Pulse Generator Serial Number: 7222573
Lead Channel Setting Pacing Amplitude: 2.5 V
Lead Channel Setting Pacing Pulse Width: 0.4 ms
Lead Channel Setting Sensing Sensitivity: 2 mV
MDC IDC MSMT BATTERY VOLTAGE: 2.98 V
MDC IDC MSMT LEADCHNL RV IMPEDANCE VALUE: 560 Ohm
MDC IDC MSMT LEADCHNL RV PACING THRESHOLD AMPLITUDE: 1 V
MDC IDC MSMT LEADCHNL RV PACING THRESHOLD PULSEWIDTH: 0.4 ms
MDC IDC MSMT LEADCHNL RV SENSING INTR AMPL: 12 mV
MDC IDC SESS DTM: 20150824061611

## 2014-03-08 ENCOUNTER — Encounter: Payer: Self-pay | Admitting: Cardiology

## 2014-04-06 DIAGNOSIS — N182 Chronic kidney disease, stage 2 (mild): Secondary | ICD-10-CM | POA: Diagnosis not present

## 2014-04-06 DIAGNOSIS — Z7901 Long term (current) use of anticoagulants: Secondary | ICD-10-CM | POA: Diagnosis not present

## 2014-04-06 DIAGNOSIS — I519 Heart disease, unspecified: Secondary | ICD-10-CM | POA: Diagnosis not present

## 2014-04-06 DIAGNOSIS — M899 Disorder of bone, unspecified: Secondary | ICD-10-CM | POA: Diagnosis not present

## 2014-04-06 DIAGNOSIS — M949 Disorder of cartilage, unspecified: Secondary | ICD-10-CM | POA: Diagnosis not present

## 2014-04-06 DIAGNOSIS — Z1331 Encounter for screening for depression: Secondary | ICD-10-CM | POA: Diagnosis not present

## 2014-04-06 DIAGNOSIS — I4891 Unspecified atrial fibrillation: Secondary | ICD-10-CM | POA: Diagnosis not present

## 2014-04-06 DIAGNOSIS — M1990 Unspecified osteoarthritis, unspecified site: Secondary | ICD-10-CM | POA: Diagnosis not present

## 2014-04-06 DIAGNOSIS — I6789 Other cerebrovascular disease: Secondary | ICD-10-CM | POA: Diagnosis not present

## 2014-04-06 DIAGNOSIS — I10 Essential (primary) hypertension: Secondary | ICD-10-CM | POA: Diagnosis not present

## 2014-04-06 DIAGNOSIS — Z23 Encounter for immunization: Secondary | ICD-10-CM | POA: Diagnosis not present

## 2014-05-10 DIAGNOSIS — I4891 Unspecified atrial fibrillation: Secondary | ICD-10-CM | POA: Diagnosis not present

## 2014-05-10 DIAGNOSIS — Z7901 Long term (current) use of anticoagulants: Secondary | ICD-10-CM | POA: Diagnosis not present

## 2014-05-31 ENCOUNTER — Ambulatory Visit (INDEPENDENT_AMBULATORY_CARE_PROVIDER_SITE_OTHER): Payer: Medicare Other | Admitting: Cardiology

## 2014-05-31 ENCOUNTER — Encounter: Payer: Self-pay | Admitting: Cardiology

## 2014-05-31 VITALS — BP 140/62 | HR 62 | Ht 67.0 in | Wt 145.7 lb

## 2014-05-31 DIAGNOSIS — Z950 Presence of cardiac pacemaker: Secondary | ICD-10-CM | POA: Diagnosis not present

## 2014-05-31 DIAGNOSIS — I482 Chronic atrial fibrillation, unspecified: Secondary | ICD-10-CM

## 2014-05-31 DIAGNOSIS — I5032 Chronic diastolic (congestive) heart failure: Secondary | ICD-10-CM | POA: Diagnosis not present

## 2014-05-31 DIAGNOSIS — I10 Essential (primary) hypertension: Secondary | ICD-10-CM | POA: Diagnosis not present

## 2014-05-31 DIAGNOSIS — I495 Sick sinus syndrome: Secondary | ICD-10-CM

## 2014-05-31 NOTE — Progress Notes (Signed)
Susan [name] Date of Birth: 08/11/1920   History of Present Illness: Susan [name] is seen back today for followup of afib. She has a history of permanent atrial fibrillation. She has a pacemaker in place. In general she is doing quite well. She remains active with church and walking. No chest pain. No dizziness or palpitations. Coumadin has been therapeutic. She does note infrequent dyspnea on exertion. Takes extra lasix about once a month.   Current Outpatient Prescriptions on File Prior to Visit  Medication Sig Dispense Refill  . Calcium Carbonate (CALTRATE 600 PO) Take 1 capsule by mouth 2 (two) times daily.     . Cyanocobalamin (VITAMIN B-12 PO) Take 1 capsule by mouth daily.     . digoxin (LANOXIN) 0.125 MG tablet Take 1 tablet (0.125 mg total) by mouth daily. 90 tablet 1  . docusate sodium (COLACE) 100 MG capsule Take 100 mg by mouth as needed.      . donepezil (ARICEPT) 10 MG tablet Take 10 mg by mouth at bedtime.      . fish oil-omega-3 fatty acids 1000 MG capsule Take 2 g by mouth daily.      . fluticasone (FLONASE) 50 MCG/ACT nasal spray Place 2 sprays into the nose daily.     . Fluticasone-Salmeterol (ADVAIR DISKUS) 250-50 MCG/DOSE AEPB Inhale 1 puff into the lungs every 12 (twelve) hours.      . furosemide (LASIX) 20 MG tablet Take 20 mg by mouth.    . Garlic 100 MG TABS Take 1 tablet by mouth daily.     Marland Kitchen. HYDROcodone-acetaminophen (NORCO) 10-325 MG per tablet Take 1 tablet by mouth every 6 (six) hours as needed.     Marland Kitchen. ipratropium (ATROVENT) 0.06 % nasal spray Place 1 spray into both nostrils as directed.     . metoprolol succinate (TOPROL-XL) 100 MG 24 hr tablet Take 1 tablet (100 mg total) by mouth daily. Take with or immediately following a meal. 90 tablet 2  . montelukast (SINGULAIR) 10 MG tablet Take 10 mg by mouth at bedtime.      . Multiple Vitamins-Minerals (OCUVITE ADULT 50+ PO) Take 1 capsule by mouth daily.     Marland Kitchen. warfarin (COUMADIN) 5 MG tablet Take 5 mg by mouth as  directed.      . [DISCONTINUED] Dexlansoprazole (DEXILANT PO) Take by mouth as needed.       No current facility-administered medications on file prior to visit.    Allergies  Allergen Reactions  . Prednisone     Long term has reaction; felt like passing out  . Sulfa Antibiotics Nausea And Vomiting  . Amoxicillin Rash    Past Medical History  Diagnosis Date  . Hypertension   . Asthma   . Dementia     mild  . Osteoarthritis   . Gallstones   . Chronic kidney disease     Stage 2 to 3  . Anemia   . Stroke     Remote R cerebellar stroke with gait abnormaility  . Atrial fibrillation     permanent  . Coronary artery disease   . Anticoagulant long-term use   . Chronic diastolic CHF (congestive heart failure)   . Valvular heart disease     Mitral and tricuspid insufficiency  . Tachy-brady syndrome   . Pacemaker May 2012    VVI per Dr. Johney FrameAllred    Past Surgical History  Procedure Laterality Date  . Abdominal hysterectomy      total  . Appendectomy    .  Hemorrhoid surgery    . Cataract extraction, bilateral    . Total knee arthroplasty    . Cholecystectomy    . Pacemaker insertion  May 2012    St Jude Accent SR RF implant. Dr. Johney FrameAllred    History  Smoking status  . Never Smoker   Smokeless tobacco  . Not on file    History  Alcohol Use No    Family History  Problem Relation Age of Onset  . Clotting disorder Mother   . Suicidality Father     Review of Systems: The review of systems is as above.  All other systems were reviewed and are negative.  Physical Exam: BP 140/62 mmHg  Pulse 62  Ht 5\' 7"  (1.702 m)  Wt 145 lb 11.2 oz (66.089 kg)  BMI 22.81 kg/m2 Patient is very pleasant and in no acute distress. She looks younger than her stated age. Skin is warm and dry. Color is normal.  HEENT is unremarkable. Normocephalic/atraumatic. PERRL. Sclera are nonicteric. Neck is supple. No masses. No JVD. Lungs are clear. Cardiac exam shows an irregular rate and rhythm.  Normal S1 and S2. No gallops or murmur. Abdomen is soft. Extremities reveal trace edema. Gait and ROM are intact. No gross neurologic deficits noted.  LABORATORY DATA: Ecg: afib with occ. Paced beat. Rate 62bpm. Nonspecific ST-T changes c/w Dig effect. I have personally reviewed and interpreted this study.    Assessment / Plan: 1. Atrial fibrillation, permanent. Managed with rate control and anticoagulation. Continue current dose of digoxin and metoprolol. [facility]  2. Chronic congestive heart failure with diastolic dysfunction. No significant edema. Weight is stable. Continue Lasix 20 mg per day. She may take an extra dose if she is retaining fluid her increasing weight.  3. Sick sinus syndrome status post pacemaker implant in May of 2012. Followed in our pacemaker clinic. Last check in August was good.   I will follow up in 6 months.

## 2014-05-31 NOTE — Patient Instructions (Signed)
Continue your current therapy  I will see you in 6 months.   

## 2014-06-01 ENCOUNTER — Ambulatory Visit (INDEPENDENT_AMBULATORY_CARE_PROVIDER_SITE_OTHER): Payer: Medicare Other | Admitting: *Deleted

## 2014-06-01 DIAGNOSIS — I495 Sick sinus syndrome: Secondary | ICD-10-CM

## 2014-06-01 LAB — MDC_IDC_ENUM_SESS_TYPE_REMOTE
Battery Remaining Percentage: 95.5 %
Battery Voltage: 2.99 V
Brady Statistic RV Percent Paced: 44 %
Implantable Pulse Generator Model: 1210
Lead Channel Impedance Value: 550 Ohm
Lead Channel Setting Pacing Pulse Width: 0.4 ms
MDC IDC MSMT BATTERY REMAINING LONGEVITY: 142 mo
MDC IDC MSMT LEADCHNL RV PACING THRESHOLD AMPLITUDE: 1 V
MDC IDC MSMT LEADCHNL RV PACING THRESHOLD PULSEWIDTH: 0.4 ms
MDC IDC MSMT LEADCHNL RV SENSING INTR AMPL: 12 mV
MDC IDC PG SERIAL: 7222573
MDC IDC SESS DTM: 20151125084915
MDC IDC SET LEADCHNL RV PACING AMPLITUDE: 2.5 V
MDC IDC SET LEADCHNL RV SENSING SENSITIVITY: 2 mV

## 2014-06-01 NOTE — Progress Notes (Signed)
Remote pacemaker transmission.   

## 2014-06-09 DIAGNOSIS — H6123 Impacted cerumen, bilateral: Secondary | ICD-10-CM | POA: Diagnosis not present

## 2014-06-09 DIAGNOSIS — I4891 Unspecified atrial fibrillation: Secondary | ICD-10-CM | POA: Diagnosis not present

## 2014-06-09 DIAGNOSIS — Z7901 Long term (current) use of anticoagulants: Secondary | ICD-10-CM | POA: Diagnosis not present

## 2014-06-14 ENCOUNTER — Encounter: Payer: Self-pay | Admitting: Cardiology

## 2014-06-21 ENCOUNTER — Encounter: Payer: Self-pay | Admitting: Internal Medicine

## 2014-06-23 DIAGNOSIS — R609 Edema, unspecified: Secondary | ICD-10-CM | POA: Diagnosis not present

## 2014-06-23 DIAGNOSIS — I509 Heart failure, unspecified: Secondary | ICD-10-CM | POA: Diagnosis not present

## 2014-06-23 DIAGNOSIS — I4891 Unspecified atrial fibrillation: Secondary | ICD-10-CM | POA: Diagnosis not present

## 2014-06-23 DIAGNOSIS — Z7901 Long term (current) use of anticoagulants: Secondary | ICD-10-CM | POA: Diagnosis not present

## 2014-07-15 DIAGNOSIS — M25552 Pain in left hip: Secondary | ICD-10-CM | POA: Diagnosis not present

## 2014-07-15 DIAGNOSIS — M47816 Spondylosis without myelopathy or radiculopathy, lumbar region: Secondary | ICD-10-CM | POA: Diagnosis not present

## 2014-07-15 DIAGNOSIS — Z7901 Long term (current) use of anticoagulants: Secondary | ICD-10-CM | POA: Diagnosis not present

## 2014-07-15 DIAGNOSIS — I4891 Unspecified atrial fibrillation: Secondary | ICD-10-CM | POA: Diagnosis not present

## 2014-07-15 DIAGNOSIS — M160 Bilateral primary osteoarthritis of hip: Secondary | ICD-10-CM | POA: Diagnosis not present

## 2014-07-15 DIAGNOSIS — M79605 Pain in left leg: Secondary | ICD-10-CM | POA: Diagnosis not present

## 2014-07-15 DIAGNOSIS — M545 Low back pain: Secondary | ICD-10-CM | POA: Diagnosis not present

## 2014-08-05 DIAGNOSIS — Z7901 Long term (current) use of anticoagulants: Secondary | ICD-10-CM | POA: Diagnosis not present

## 2014-08-05 DIAGNOSIS — M25552 Pain in left hip: Secondary | ICD-10-CM | POA: Diagnosis not present

## 2014-08-05 DIAGNOSIS — I4891 Unspecified atrial fibrillation: Secondary | ICD-10-CM | POA: Diagnosis not present

## 2014-08-08 DIAGNOSIS — M5432 Sciatica, left side: Secondary | ICD-10-CM | POA: Diagnosis not present

## 2014-08-08 DIAGNOSIS — M25552 Pain in left hip: Secondary | ICD-10-CM | POA: Diagnosis not present

## 2014-08-08 DIAGNOSIS — M5137 Other intervertebral disc degeneration, lumbosacral region: Secondary | ICD-10-CM | POA: Diagnosis not present

## 2014-09-02 DIAGNOSIS — Z7901 Long term (current) use of anticoagulants: Secondary | ICD-10-CM | POA: Diagnosis not present

## 2014-09-02 DIAGNOSIS — M25552 Pain in left hip: Secondary | ICD-10-CM | POA: Diagnosis not present

## 2014-09-02 DIAGNOSIS — I4891 Unspecified atrial fibrillation: Secondary | ICD-10-CM | POA: Diagnosis not present

## 2014-09-05 ENCOUNTER — Ambulatory Visit (INDEPENDENT_AMBULATORY_CARE_PROVIDER_SITE_OTHER): Payer: Medicare Other | Admitting: *Deleted

## 2014-09-05 DIAGNOSIS — I495 Sick sinus syndrome: Secondary | ICD-10-CM

## 2014-09-05 LAB — MDC_IDC_ENUM_SESS_TYPE_REMOTE
Battery Remaining Longevity: 131 mo
Battery Remaining Percentage: 91 %
Implantable Pulse Generator Model: 1210
Implantable Pulse Generator Serial Number: 7222573
Lead Channel Impedance Value: 550 Ohm
Lead Channel Pacing Threshold Amplitude: 1 V
Lead Channel Pacing Threshold Pulse Width: 0.4 ms
Lead Channel Sensing Intrinsic Amplitude: 12 mV
Lead Channel Setting Pacing Pulse Width: 0.4 ms
MDC IDC MSMT BATTERY VOLTAGE: 2.98 V
MDC IDC SESS DTM: 20160229072333
MDC IDC SET LEADCHNL RV PACING AMPLITUDE: 2.5 V
MDC IDC SET LEADCHNL RV SENSING SENSITIVITY: 2 mV
MDC IDC STAT BRADY RV PERCENT PACED: 44 %

## 2014-09-05 NOTE — Progress Notes (Signed)
Remote pacemaker transmission.   

## 2014-09-15 ENCOUNTER — Encounter: Payer: Self-pay | Admitting: *Deleted

## 2014-09-21 ENCOUNTER — Encounter: Payer: Self-pay | Admitting: Internal Medicine

## 2014-10-03 DIAGNOSIS — I4891 Unspecified atrial fibrillation: Secondary | ICD-10-CM | POA: Diagnosis not present

## 2014-10-03 DIAGNOSIS — J45909 Unspecified asthma, uncomplicated: Secondary | ICD-10-CM | POA: Diagnosis not present

## 2014-10-03 DIAGNOSIS — G309 Alzheimer's disease, unspecified: Secondary | ICD-10-CM | POA: Diagnosis not present

## 2014-10-03 DIAGNOSIS — Z6823 Body mass index (BMI) 23.0-23.9, adult: Secondary | ICD-10-CM | POA: Diagnosis not present

## 2014-10-03 DIAGNOSIS — I10 Essential (primary) hypertension: Secondary | ICD-10-CM | POA: Diagnosis not present

## 2014-10-03 DIAGNOSIS — I5189 Other ill-defined heart diseases: Secondary | ICD-10-CM | POA: Diagnosis not present

## 2014-10-03 DIAGNOSIS — R3915 Urgency of urination: Secondary | ICD-10-CM | POA: Diagnosis not present

## 2014-10-03 DIAGNOSIS — Z7901 Long term (current) use of anticoagulants: Secondary | ICD-10-CM | POA: Diagnosis not present

## 2014-10-03 DIAGNOSIS — M25552 Pain in left hip: Secondary | ICD-10-CM | POA: Diagnosis not present

## 2014-10-21 DIAGNOSIS — H4720 Unspecified optic atrophy: Secondary | ICD-10-CM | POA: Diagnosis not present

## 2014-10-21 DIAGNOSIS — H02834 Dermatochalasis of left upper eyelid: Secondary | ICD-10-CM | POA: Diagnosis not present

## 2014-10-21 DIAGNOSIS — Z961 Presence of intraocular lens: Secondary | ICD-10-CM | POA: Diagnosis not present

## 2014-10-21 DIAGNOSIS — H02831 Dermatochalasis of right upper eyelid: Secondary | ICD-10-CM | POA: Diagnosis not present

## 2014-11-03 DIAGNOSIS — Z6823 Body mass index (BMI) 23.0-23.9, adult: Secondary | ICD-10-CM | POA: Diagnosis not present

## 2014-11-03 DIAGNOSIS — G309 Alzheimer's disease, unspecified: Secondary | ICD-10-CM | POA: Diagnosis not present

## 2014-11-03 DIAGNOSIS — Z7901 Long term (current) use of anticoagulants: Secondary | ICD-10-CM | POA: Diagnosis not present

## 2014-11-03 DIAGNOSIS — I4891 Unspecified atrial fibrillation: Secondary | ICD-10-CM | POA: Diagnosis not present

## 2014-11-28 ENCOUNTER — Encounter: Payer: Self-pay | Admitting: Internal Medicine

## 2014-11-28 ENCOUNTER — Ambulatory Visit (INDEPENDENT_AMBULATORY_CARE_PROVIDER_SITE_OTHER): Payer: Medicare Other | Admitting: Internal Medicine

## 2014-11-28 ENCOUNTER — Other Ambulatory Visit: Payer: Self-pay

## 2014-11-28 VITALS — BP 170/80 | HR 59 | Ht 67.0 in | Wt 146.6 lb

## 2014-11-28 DIAGNOSIS — I495 Sick sinus syndrome: Secondary | ICD-10-CM | POA: Diagnosis not present

## 2014-11-28 DIAGNOSIS — I482 Chronic atrial fibrillation, unspecified: Secondary | ICD-10-CM

## 2014-11-28 DIAGNOSIS — I10 Essential (primary) hypertension: Secondary | ICD-10-CM | POA: Diagnosis not present

## 2014-11-28 DIAGNOSIS — Z950 Presence of cardiac pacemaker: Secondary | ICD-10-CM

## 2014-11-28 LAB — CUP PACEART INCLINIC DEVICE CHECK
Battery Voltage: 2.99 V
Lead Channel Pacing Threshold Amplitude: 1 V
Lead Channel Pacing Threshold Amplitude: 1 V
Lead Channel Pacing Threshold Pulse Width: 0.4 ms
Lead Channel Sensing Intrinsic Amplitude: 12 mV
Lead Channel Setting Pacing Amplitude: 1.25 V
Lead Channel Setting Pacing Pulse Width: 0.4 ms
Lead Channel Setting Sensing Sensitivity: 2 mV
MDC IDC MSMT BATTERY REMAINING LONGEVITY: 154.8 mo
MDC IDC MSMT LEADCHNL RV IMPEDANCE VALUE: 562.5 Ohm
MDC IDC MSMT LEADCHNL RV PACING THRESHOLD PULSEWIDTH: 0.4 ms
MDC IDC PG SERIAL: 7222573
MDC IDC SESS DTM: 20160523160225
MDC IDC STAT BRADY RV PERCENT PACED: 44 %

## 2014-11-28 MED ORDER — AMLODIPINE BESYLATE 2.5 MG PO TABS: 2.5000 mg | ORAL_TABLET | Freq: Every day | ORAL | Status: AC

## 2014-11-28 NOTE — Progress Notes (Signed)
PCP: Hoyle SauerAVVA,RAVISANKAR R, MD Primary Cardiologist:  Dr SwazilandJordan  The patient presents today for routine electrophysiology followup.  Since last being seen in our clinic, the patient reports doing reasonably well.   She remains very active for her age.  Today, she denies symptoms of palpitations, chest pain,  orthopnea, PND, lower extremity edema, presyncope, syncope, or neurologic sequela.  The patient feels that she is tolerating medications without difficulties and is otherwise without complaint today.   Past Medical History  Diagnosis Date  . Hypertension   . Asthma   . Dementia     mild  . Osteoarthritis   . Gallstones   . Chronic kidney disease     Stage 2 to 3  . Anemia   . Stroke     Remote R cerebellar stroke with gait abnormaility  . Atrial fibrillation     permanent  . Coronary artery disease   . Anticoagulant long-term use   . Chronic diastolic CHF (congestive heart failure)   . Valvular heart disease     Mitral and tricuspid insufficiency  . Tachy-brady syndrome   . Pacemaker May 2012    VVI per Dr. Johney FrameAllred   Past Surgical History  Procedure Laterality Date  . Abdominal hysterectomy      total  . Appendectomy    . Hemorrhoid surgery    . Cataract extraction, bilateral    . Total knee arthroplasty    . Cholecystectomy    . Pacemaker insertion  May 2012    St Jude Accent SR RF implant. Dr. Johney FrameAllred    Current Outpatient Prescriptions  Medication Sig Dispense Refill  . Calcium Carbonate (CALTRATE 600 PO) Take 1 capsule by mouth 2 (two) times daily.     . Cyanocobalamin (VITAMIN B-12 PO) Take 1 capsule by mouth daily.     . digoxin (LANOXIN) 0.125 MG tablet Take 1 tablet (0.125 mg total) by mouth daily. 90 tablet 1  . docusate sodium (COLACE) 100 MG capsule Take 100 mg by mouth daily as needed (constipation).     Marland Kitchen. donepezil (ARICEPT) 10 MG tablet Take 10 mg by mouth at bedtime.      . fish oil-omega-3 fatty acids 1000 MG capsule Take 1 g by mouth daily.     .  fluticasone (FLONASE) 50 MCG/ACT nasal spray Place 2 sprays into the nose daily.     . Fluticasone-Salmeterol (ADVAIR DISKUS) 250-50 MCG/DOSE AEPB Inhale 1 puff into the lungs every 12 (twelve) hours.      . furosemide (LASIX) 20 MG tablet Take 20 mg by mouth daily. Pt can take 1 extra tablet by mouth daily as needed for weight gain    . Garlic 100 MG TABS Take 1 tablet by mouth daily.     Marland Kitchen. HYDROcodone-acetaminophen (NORCO) 10-325 MG per tablet Take 1 tablet by mouth every 6 (six) hours as needed (pain).     Marland Kitchen. ipratropium (ATROVENT) 0.06 % nasal spray Place 1 spray into both nostrils as directed.     . metoprolol succinate (TOPROL-XL) 100 MG 24 hr tablet Take 1 tablet (100 mg total) by mouth daily. Take with or immediately following a meal. 90 tablet 2  . montelukast (SINGULAIR) 10 MG tablet Take 10 mg by mouth at bedtime.      . Multiple Vitamins-Minerals (OCUVITE ADULT 50+ PO) Take 1 capsule by mouth daily.     Marland Kitchen. warfarin (COUMADIN) 5 MG tablet Take 5 mg by mouth as directed.      Marland Kitchen. amLODipine (  NORVASC) 2.5 MG tablet Take 1 tablet (2.5 mg total) by mouth daily. 90 tablet 3  . NAMENDA XR 28 MG CP24 24 hr capsule Take 28 mg by mouth daily.  6  . [DISCONTINUED] Dexlansoprazole (DEXILANT PO) Take by mouth as needed.       No current facility-administered medications for this visit.    Allergies  Allergen Reactions  . Prednisone     Long term has reaction; felt like passing out  . Sulfa Antibiotics Nausea And Vomiting  . Amoxicillin Rash    ONLY IN IV (PO OK)    History   Social History  . Marital Status: Widowed    Spouse Name: N/A  . Number of Children: N/A  . Years of Education: N/A   Occupational History  . Not on file.   Social History Main Topics  . Smoking status: Never Smoker   . Smokeless tobacco: Not on file  . Alcohol Use: No  . Drug Use: No  . Sexual Activity: Not on file   Other Topics Concern  . Not on file   Social History Narrative   Pt lives in  [place] with daughter.  Attends [employer]   Retired from an [employer].    Family History  Problem Relation Age of Onset  . Clotting disorder Mother   . Suicidality Father     Physical Exam: Filed Vitals:   11/28/14 1153  BP: 170/80  Pulse: 59  Height:  (1.702 m)  Weight: 66.497 kg (146 lb 9.6 oz)    GEN- The patient is well appearing, alert and oriented x 3 today.   Head- normocephalic, atraumatic Eyes-  Sclera clear, conjunctiva pink Ears- hearing intact Oropharynx- clear Neck- supple,   Lungs- Clear to ausculation bilaterally, normal work of breathing Chest- pacemaker pocket is well healed Heart- irregular rate and rhythm, no murmurs, rubs or gallops, PMI not laterally displaced GI- soft, NT, ND, + BS Extremities- no clubbing, cyanosis, or edema  Pacemaker interrogation- reviewed in detail today,  See PACEART report  Assessment and Plan:  1. Tachycardia/ bradycardia syndrome Normal pacemaker function See Pace Art report No changes today  2. Permanent atrial fibrillation Rate controlled Continue coumadin long term  3. HTN Elevated BP Will add norvasc 2.5mg  daily Can be uptitrated when she sees Dr Swaziland if needed  Merlin compliant Follow-up with EP NP in the device clinic in 1 year

## 2014-11-28 NOTE — Patient Instructions (Signed)
Medication Instructions:  Your physician has recommended you make the following change in your medication:  1) Start Norvasc 2.5 mg daily   Labwork: None ordered  Testing/Procedures: None ordered  Follow-Up: Your physician wants you to follow-up in: 12 months with Gypsy BalsamAmber Seiler, NP You will receive a reminder letter in the mail two months in advance. If you don't receive a letter, please call our office to schedule the follow-up appointment.  Remote monitoring is used to monitor your Pacemaker or ICD from home. This monitoring reduces the number of office visits required to check your device to one time per year. It allows us to keep an eye on the functioning of your device to ensure it is working properly. You are scheduled for a device check from home on 02/27/15. You may send your transmission at any time that day. If you have a wireless device, the transmission will be sent automatically. After your physician reviews your transmission, you will receive a postcard with your next transmission date.    Any Other Special Instructions Will Be Listed Below (If Applicable).

## 2014-12-06 ENCOUNTER — Encounter: Payer: Self-pay | Admitting: Internal Medicine

## 2014-12-06 ENCOUNTER — Encounter: Payer: Self-pay | Admitting: Cardiology

## 2014-12-06 ENCOUNTER — Ambulatory Visit (INDEPENDENT_AMBULATORY_CARE_PROVIDER_SITE_OTHER): Payer: Medicare Other | Admitting: Cardiology

## 2014-12-06 VITALS — BP 114/50 | HR 68 | Ht 67.0 in | Wt 143.7 lb

## 2014-12-06 DIAGNOSIS — Z950 Presence of cardiac pacemaker: Secondary | ICD-10-CM | POA: Diagnosis not present

## 2014-12-06 DIAGNOSIS — I5032 Chronic diastolic (congestive) heart failure: Secondary | ICD-10-CM | POA: Diagnosis not present

## 2014-12-06 DIAGNOSIS — I482 Chronic atrial fibrillation, unspecified: Secondary | ICD-10-CM

## 2014-12-06 DIAGNOSIS — Z7901 Long term (current) use of anticoagulants: Secondary | ICD-10-CM | POA: Diagnosis not present

## 2014-12-06 DIAGNOSIS — I10 Essential (primary) hypertension: Secondary | ICD-10-CM

## 2014-12-06 DIAGNOSIS — I4891 Unspecified atrial fibrillation: Secondary | ICD-10-CM | POA: Diagnosis not present

## 2014-12-06 NOTE — Progress Notes (Signed)
Susan [name] Date of Birth: 1920/10/22   History of Present Illness: Susan [name] is seen back today for followup of afib. She has a history of permanent atrial fibrillation. She has a pacemaker in place. In general she is doing quite well. She remains active with church and walking. No chest pain. No dizziness or palpitations. She does note dyspnea on exertion. Still uses her inhalers. Recent pacemaker check was good. Noted to be hypertensive and was started on amlodipine 2.5 mg daily. Tolerating this well. Has rarely had to take an extra lasix for swelling.   Current Outpatient Prescriptions on File Prior to Visit  Medication Sig Dispense Refill  . amLODipine (NORVASC) 2.5 MG tablet Take 1 tablet (2.5 mg total) by mouth daily. 90 tablet 3  . Calcium Carbonate (CALTRATE 600 PO) Take 1 capsule by mouth 2 (two) times daily.     . Cyanocobalamin (VITAMIN B-12 PO) Take 1 capsule by mouth daily.     . digoxin (LANOXIN) 0.125 MG tablet Take 1 tablet (0.125 mg total) by mouth daily. 90 tablet 1  . docusate sodium (COLACE) 100 MG capsule Take 100 mg by mouth daily as needed (constipation).     Marland Kitchen donepezil (ARICEPT) 10 MG tablet Take 10 mg by mouth at bedtime.      . fish oil-omega-3 fatty acids 1000 MG capsule Take 1 g by mouth daily.     . fluticasone (FLONASE) 50 MCG/ACT nasal spray Place 2 sprays into the nose daily.     . Fluticasone-Salmeterol (ADVAIR DISKUS) 250-50 MCG/DOSE AEPB Inhale 1 puff into the lungs every 12 (twelve) hours.      . furosemide (LASIX) 20 MG tablet Take 20 mg by mouth daily. Pt can take 1 extra tablet by mouth daily as needed for weight gain    . Garlic 100 MG TABS Take 1 tablet by mouth daily.     Marland Kitchen HYDROcodone-acetaminophen (NORCO) 10-325 MG per tablet Take 1 tablet by mouth every 6 (six) hours as needed (pain).     Marland Kitchen ipratropium (ATROVENT) 0.06 % nasal spray Place 1 spray into both nostrils as directed.     . metoprolol succinate (TOPROL-XL) 100 MG 24 hr tablet Take 1  tablet (100 mg total) by mouth daily. Take with or immediately following a meal. 90 tablet 2  . montelukast (SINGULAIR) 10 MG tablet Take 10 mg by mouth at bedtime.      . Multiple Vitamins-Minerals (OCUVITE ADULT 50+ PO) Take 1 capsule by mouth daily.     Marland Kitchen NAMENDA XR 28 MG CP24 24 hr capsule Take 28 mg by mouth daily.  6  . warfarin (COUMADIN) 5 MG tablet Take 5 mg by mouth as directed.      . [DISCONTINUED] Dexlansoprazole (DEXILANT PO) Take by mouth as needed.       No current facility-administered medications on file prior to visit.    Allergies  Allergen Reactions  . Prednisone     Long term has reaction; felt like passing out  . Sulfa Antibiotics Nausea And Vomiting  . Amoxicillin Rash    ONLY IN IV (PO OK)    Past Medical History  Diagnosis Date  . Hypertension   . Asthma   . Dementia     mild  . Osteoarthritis   . Gallstones   . Chronic kidney disease     Stage 2 to 3  . Anemia   . Stroke     Remote R cerebellar stroke with gait abnormaility  .  Atrial fibrillation     permanent  . Coronary artery disease   . Anticoagulant long-term use   . Chronic diastolic CHF (congestive heart failure)   . Valvular heart disease     Mitral and tricuspid insufficiency  . Tachy-brady syndrome   . Pacemaker May 2012    VVI per Dr. Johney FrameAllred    Past Surgical History  Procedure Laterality Date  . Abdominal hysterectomy      total  . Appendectomy    . Hemorrhoid surgery    . Cataract extraction, bilateral    . Total knee arthroplasty    . Cholecystectomy    . Pacemaker insertion  May 2012    St Jude Accent SR RF implant. Dr. Johney FrameAllred    History  Smoking status  . Never Smoker   Smokeless tobacco  . Not on file    History  Alcohol Use No    Family History  Problem Relation Age of Onset  . Clotting disorder Mother   . Suicidality Father     Review of Systems: The review of systems is as above.  All other systems were reviewed and are negative.  Physical  Exam: BP 114/50 mmHg  Pulse 68  Ht 5\' 7"  (1.702 m)  Wt 65.182 kg (143 lb 11.2 oz)  BMI 22.50 kg/m2 Patient is very pleasant and in no acute distress. She looks younger than her stated age. Skin is warm and dry. Color is normal.  HEENT is unremarkable. Normocephalic/atraumatic. PERRL. Sclera are nonicteric. Neck is supple. No masses. No JVD. Lungs are clear. Cardiac exam shows an irregular rate and rhythm. Normal S1 and S2. No gallops or murmur. Abdomen is soft. Extremities reveal trace edema. Gait and ROM are intact. No gross neurologic deficits noted.  LABORATORY DATA:    Assessment / Plan: 1. Atrial fibrillation, permanent. Managed with rate control and anticoagulation. Continue current dose of digoxin and metoprolol. [facility].  2. Chronic congestive heart failure with diastolic dysfunction. No significant edema. Weight is stable. Continue Lasix 20 mg per day. She may take an extra dose if she is retaining fluid her increasing weight.  3. Sick sinus syndrome status post pacemaker implant in May of 2012. Followed in our pacemaker clinic. Last check  was good.   4. HTN- improved control with low dose amlodipine.  I will follow up in 6 months.

## 2014-12-06 NOTE — Patient Instructions (Signed)
Continue your current therapy  I will see you in 6 months.   

## 2015-01-12 DIAGNOSIS — Z7901 Long term (current) use of anticoagulants: Secondary | ICD-10-CM | POA: Diagnosis not present

## 2015-01-12 DIAGNOSIS — I4891 Unspecified atrial fibrillation: Secondary | ICD-10-CM | POA: Diagnosis not present

## 2015-01-13 DIAGNOSIS — Z6823 Body mass index (BMI) 23.0-23.9, adult: Secondary | ICD-10-CM | POA: Diagnosis not present

## 2015-01-13 DIAGNOSIS — Z950 Presence of cardiac pacemaker: Secondary | ICD-10-CM | POA: Diagnosis not present

## 2015-01-13 DIAGNOSIS — I638 Other cerebral infarction: Secondary | ICD-10-CM | POA: Diagnosis not present

## 2015-01-13 DIAGNOSIS — I10 Essential (primary) hypertension: Secondary | ICD-10-CM | POA: Diagnosis not present

## 2015-01-13 DIAGNOSIS — D539 Nutritional anemia, unspecified: Secondary | ICD-10-CM | POA: Diagnosis not present

## 2015-01-13 DIAGNOSIS — I509 Heart failure, unspecified: Secondary | ICD-10-CM | POA: Diagnosis not present

## 2015-01-13 DIAGNOSIS — G309 Alzheimer's disease, unspecified: Secondary | ICD-10-CM | POA: Diagnosis not present

## 2015-01-13 DIAGNOSIS — I6782 Cerebral ischemia: Secondary | ICD-10-CM | POA: Diagnosis not present

## 2015-01-13 DIAGNOSIS — Z7901 Long term (current) use of anticoagulants: Secondary | ICD-10-CM | POA: Diagnosis not present

## 2015-01-13 DIAGNOSIS — I4891 Unspecified atrial fibrillation: Secondary | ICD-10-CM | POA: Diagnosis not present

## 2015-01-13 DIAGNOSIS — G454 Transient global amnesia: Secondary | ICD-10-CM | POA: Diagnosis not present

## 2015-01-13 DIAGNOSIS — N182 Chronic kidney disease, stage 2 (mild): Secondary | ICD-10-CM | POA: Diagnosis not present

## 2015-01-13 DIAGNOSIS — R413 Other amnesia: Secondary | ICD-10-CM | POA: Diagnosis not present

## 2015-01-16 DIAGNOSIS — H4720 Unspecified optic atrophy: Secondary | ICD-10-CM | POA: Diagnosis not present

## 2015-01-16 DIAGNOSIS — Z961 Presence of intraocular lens: Secondary | ICD-10-CM | POA: Diagnosis not present

## 2015-01-16 DIAGNOSIS — H1789 Other corneal scars and opacities: Secondary | ICD-10-CM | POA: Diagnosis not present

## 2015-02-20 DIAGNOSIS — R5383 Other fatigue: Secondary | ICD-10-CM | POA: Diagnosis not present

## 2015-02-20 DIAGNOSIS — J45909 Unspecified asthma, uncomplicated: Secondary | ICD-10-CM | POA: Diagnosis not present

## 2015-02-20 DIAGNOSIS — Z7901 Long term (current) use of anticoagulants: Secondary | ICD-10-CM | POA: Diagnosis not present

## 2015-02-20 DIAGNOSIS — M859 Disorder of bone density and structure, unspecified: Secondary | ICD-10-CM | POA: Diagnosis not present

## 2015-02-20 DIAGNOSIS — Z6823 Body mass index (BMI) 23.0-23.9, adult: Secondary | ICD-10-CM | POA: Diagnosis not present

## 2015-02-20 DIAGNOSIS — I10 Essential (primary) hypertension: Secondary | ICD-10-CM | POA: Diagnosis not present

## 2015-02-20 DIAGNOSIS — K219 Gastro-esophageal reflux disease without esophagitis: Secondary | ICD-10-CM | POA: Diagnosis not present

## 2015-02-20 DIAGNOSIS — M179 Osteoarthritis of knee, unspecified: Secondary | ICD-10-CM | POA: Diagnosis not present

## 2015-02-20 DIAGNOSIS — Z1389 Encounter for screening for other disorder: Secondary | ICD-10-CM | POA: Diagnosis not present

## 2015-02-20 DIAGNOSIS — I4891 Unspecified atrial fibrillation: Secondary | ICD-10-CM | POA: Diagnosis not present

## 2015-02-20 DIAGNOSIS — R42 Dizziness and giddiness: Secondary | ICD-10-CM | POA: Diagnosis not present

## 2015-02-20 DIAGNOSIS — R413 Other amnesia: Secondary | ICD-10-CM | POA: Diagnosis not present

## 2015-02-27 ENCOUNTER — Encounter: Payer: Self-pay | Admitting: Internal Medicine

## 2015-02-27 ENCOUNTER — Ambulatory Visit (INDEPENDENT_AMBULATORY_CARE_PROVIDER_SITE_OTHER): Payer: Medicare Other | Admitting: *Deleted

## 2015-02-27 DIAGNOSIS — I495 Sick sinus syndrome: Secondary | ICD-10-CM

## 2015-02-28 NOTE — Progress Notes (Signed)
Remote pacemaker transmission.   

## 2015-03-03 LAB — CUP PACEART REMOTE DEVICE CHECK
Battery Remaining Longevity: 140 mo
Battery Remaining Percentage: 95.5 %
Brady Statistic RV Percent Paced: 50 %
Date Time Interrogation Session: 20160822063133
Lead Channel Pacing Threshold Amplitude: 0.75 V
Lead Channel Setting Pacing Amplitude: 1 V
Lead Channel Setting Pacing Pulse Width: 0.4 ms
MDC IDC MSMT BATTERY VOLTAGE: 2.96 V
MDC IDC MSMT LEADCHNL RV IMPEDANCE VALUE: 550 Ohm
MDC IDC MSMT LEADCHNL RV PACING THRESHOLD PULSEWIDTH: 0.4 ms
MDC IDC MSMT LEADCHNL RV SENSING INTR AMPL: 12 mV
MDC IDC PG SERIAL: 7222573
MDC IDC SET LEADCHNL RV SENSING SENSITIVITY: 2 mV

## 2015-03-10 ENCOUNTER — Encounter: Payer: Self-pay | Admitting: Cardiology

## 2015-03-27 DIAGNOSIS — Z23 Encounter for immunization: Secondary | ICD-10-CM | POA: Diagnosis not present

## 2015-03-27 DIAGNOSIS — Z7901 Long term (current) use of anticoagulants: Secondary | ICD-10-CM | POA: Diagnosis not present

## 2015-03-27 DIAGNOSIS — I4891 Unspecified atrial fibrillation: Secondary | ICD-10-CM | POA: Diagnosis not present

## 2015-05-01 DIAGNOSIS — Z7901 Long term (current) use of anticoagulants: Secondary | ICD-10-CM | POA: Diagnosis not present

## 2015-05-01 DIAGNOSIS — I4891 Unspecified atrial fibrillation: Secondary | ICD-10-CM | POA: Diagnosis not present

## 2015-05-30 ENCOUNTER — Encounter: Payer: Self-pay | Admitting: Cardiology

## 2015-05-30 ENCOUNTER — Ambulatory Visit (INDEPENDENT_AMBULATORY_CARE_PROVIDER_SITE_OTHER): Payer: Medicare Other | Admitting: Cardiology

## 2015-05-30 VITALS — BP 134/86 | HR 55 | Ht 67.0 in | Wt 141.7 lb

## 2015-05-30 DIAGNOSIS — I495 Sick sinus syndrome: Secondary | ICD-10-CM

## 2015-05-30 DIAGNOSIS — Z950 Presence of cardiac pacemaker: Secondary | ICD-10-CM

## 2015-05-30 DIAGNOSIS — I482 Chronic atrial fibrillation, unspecified: Secondary | ICD-10-CM

## 2015-05-30 DIAGNOSIS — I5032 Chronic diastolic (congestive) heart failure: Secondary | ICD-10-CM

## 2015-05-30 NOTE — Progress Notes (Signed)
Susan [name] Date of Birth: [birth year]   History of Present Illness: Susan [name] is seen back today for followup of afib. She has a history of permanent atrial fibrillation. She has a pacemaker in place. In general she is doing quite well. She remains active with church and walking. No chest pain. No dizziness or palpitations. She does note dyspnea on exertion. States she still walks as fast as ever but sometimes has to stop and catch her breath. Recovers in a few seconds.  last pacemaker check looked good.  Has rarely had to take an extra lasix for swelling.   Current Outpatient Prescriptions on File Prior to Visit  Medication Sig Dispense Refill  . amLODipine (NORVASC) 2.5 MG tablet Take 1 tablet (2.5 mg total) by mouth daily. 90 tablet 3  . BREO ELLIPTA 100-25 MCG/INH AEPB Inhale 1 puff into the lungs daily. Inhale 1 puff daily  6  . Calcium Carbonate (CALTRATE 600 PO) Take 1 capsule by mouth 2 (two) times daily.     . Cyanocobalamin (VITAMIN B-12 PO) Take 1 capsule by mouth daily.     . digoxin (LANOXIN) 0.125 MG tablet Take 1 tablet (0.125 mg total) by mouth daily. 90 tablet 1  . docusate sodium (COLACE) 100 MG capsule Take 100 mg by mouth daily as needed (constipation).     Marland Kitchen donepezil (ARICEPT) 10 MG tablet Take 10 mg by mouth at bedtime.      . fish oil-omega-3 fatty acids 1000 MG capsule Take 1 g by mouth daily.     . fluticasone (FLONASE) 50 MCG/ACT nasal spray Place 2 sprays into the nose daily.     . Fluticasone-Salmeterol (ADVAIR DISKUS) 250-50 MCG/DOSE AEPB Inhale 1 puff into the lungs every 12 (twelve) hours.      . furosemide (LASIX) 20 MG tablet Take 20 mg by mouth daily. Pt can take 1 extra tablet by mouth daily as needed for weight gain    . Garlic 100 MG TABS Take 1 tablet by mouth daily.     Marland Kitchen HYDROcodone-acetaminophen (NORCO) 10-325 MG per tablet Take 1 tablet by mouth every 6 (six) hours as needed (pain).     Marland Kitchen ipratropium (ATROVENT) 0.06 % nasal spray Place 1 spray into  both nostrils as directed.     . metoprolol succinate (TOPROL-XL) 100 MG 24 hr tablet Take 1 tablet (100 mg total) by mouth daily. Take with or immediately following a meal. 90 tablet 2  . montelukast (SINGULAIR) 10 MG tablet Take 10 mg by mouth at bedtime.      . Multiple Vitamins-Minerals (OCUVITE ADULT 50+ PO) Take 1 capsule by mouth daily.     Marland Kitchen NAMENDA XR 28 MG CP24 24 hr capsule Take 28 mg by mouth daily.  6  . warfarin (COUMADIN) 5 MG tablet Take 5 mg by mouth as directed.      . [DISCONTINUED] Dexlansoprazole (DEXILANT PO) Take by mouth as needed.       No current facility-administered medications on file prior to visit.    Allergies  Allergen Reactions  . Prednisone     Long term has reaction; felt like passing out  . Sulfa Antibiotics Nausea And Vomiting  . Amoxicillin Rash    ONLY IN IV (PO OK)    Past Medical History  Diagnosis Date  . Hypertension   . Asthma   . Dementia     mild  . Osteoarthritis   . Gallstones   . Chronic kidney disease  Stage 2 to 3  . Anemia   . Stroke [facility](HCC)     Remote R cerebellar stroke with gait abnormaility  . Atrial fibrillation (HCC)     permanent  . Coronary artery disease   . Anticoagulant long-term use   . Chronic diastolic CHF (congestive heart failure) (HCC)   . Valvular heart disease     Mitral and tricuspid insufficiency  . Tachy-brady syndrome (HCC)   . Pacemaker May 2012    VVI per Dr. Johney [name]    Past Surgical History  Procedure Laterality Date  . Abdominal hysterectomy      total  . Appendectomy    . Hemorrhoid surgery    . Cataract extraction, bilateral    . Total knee arthroplasty    . Cholecystectomy    . Pacemaker insertion  May 2012    St Jude Accent SR RF implant. Dr. Johney [name]    History  Smoking status  . Never Smoker   Smokeless tobacco  . Not on file    History  Alcohol Use No    Family History  Problem Relation Age of Onset  . Clotting disorder Mother   . Suicidality Father      Review of Systems: The review of systems is as above.  All other systems were reviewed and are negative.  Physical Exam: BP 134/86 mmHg  Pulse 55  Ht 5\' 7"  (1.702 m)  Wt 64.275 kg (141 lb 11.2 oz)  BMI 22.19 kg/m2 Patient is very pleasant and in no acute distress. She looks younger than her stated age. Skin is warm and dry. Color is normal.  HEENT is unremarkable. Normocephalic/atraumatic. PERRL. Sclera are nonicteric. Neck is supple. No masses. No JVD. Lungs are clear. Cardiac exam shows an irregular rate and rhythm. Normal S1 and S2. No gallops or murmur. Abdomen is soft. Extremities reveal trace edema. Gait and ROM are intact. No gross neurologic deficits noted.  LABORATORY DATA: Ecg today shows atrial fibrillation with occ. Paced beats. Rate 55. ST-T wave changes consistent with dig effect. I have personally reviewed and interpreted this study.    Assessment / Plan: 1. Atrial fibrillation, permanent. Managed with rate control and anticoagulation. Continue current dose of digoxin and metoprolol. Coumadin is managed at [name].  2. Chronic congestive heart failure with diastolic dysfunction. No significant edema. Weight is down 2 lbs.  Continue Lasix 20 mg per day. She may take an extra dose if she is retaining fluid her increasing weight.  3. Sick sinus syndrome status post pacemaker implant in May of 2012. Followed in our pacemaker clinic. Last check  was good.   4. HTN- improved control with low dose amlodipine.  I will follow up in 6 months.

## 2015-05-30 NOTE — Patient Instructions (Signed)
Continue your current therapy  I will see you in 6 months.   

## 2015-05-31 ENCOUNTER — Ambulatory Visit (INDEPENDENT_AMBULATORY_CARE_PROVIDER_SITE_OTHER): Payer: Medicare Other | Admitting: *Deleted

## 2015-05-31 DIAGNOSIS — I495 Sick sinus syndrome: Secondary | ICD-10-CM | POA: Diagnosis not present

## 2015-05-31 NOTE — Progress Notes (Signed)
Remote pacemaker transmission.   

## 2015-06-05 DIAGNOSIS — D539 Nutritional anemia, unspecified: Secondary | ICD-10-CM | POA: Diagnosis not present

## 2015-06-05 DIAGNOSIS — R413 Other amnesia: Secondary | ICD-10-CM | POA: Diagnosis not present

## 2015-06-05 DIAGNOSIS — Z7901 Long term (current) use of anticoagulants: Secondary | ICD-10-CM | POA: Diagnosis not present

## 2015-06-05 DIAGNOSIS — Z950 Presence of cardiac pacemaker: Secondary | ICD-10-CM | POA: Diagnosis not present

## 2015-06-05 DIAGNOSIS — Z6822 Body mass index (BMI) 22.0-22.9, adult: Secondary | ICD-10-CM | POA: Diagnosis not present

## 2015-06-05 DIAGNOSIS — M1990 Unspecified osteoarthritis, unspecified site: Secondary | ICD-10-CM | POA: Diagnosis not present

## 2015-06-05 DIAGNOSIS — R05 Cough: Secondary | ICD-10-CM | POA: Diagnosis not present

## 2015-06-05 DIAGNOSIS — I10 Essential (primary) hypertension: Secondary | ICD-10-CM | POA: Diagnosis not present

## 2015-06-12 LAB — CUP PACEART REMOTE DEVICE CHECK
Battery Remaining Longevity: 150 mo
Battery Voltage: 2.98 V
Brady Statistic RV Percent Paced: 62 %
Implantable Lead Location: 753860
Implantable Lead Model: 1948
Lead Channel Pacing Threshold Amplitude: 0.75 V
Lead Channel Setting Pacing Amplitude: 1 V
Lead Channel Setting Pacing Pulse Width: 0.4 ms
Lead Channel Setting Sensing Sensitivity: 2 mV
MDC IDC LEAD IMPLANT DT: 20120529
MDC IDC MSMT BATTERY REMAINING PERCENTAGE: 95.5 %
MDC IDC MSMT LEADCHNL RV IMPEDANCE VALUE: 550 Ohm
MDC IDC MSMT LEADCHNL RV PACING THRESHOLD PULSEWIDTH: 0.4 ms
MDC IDC MSMT LEADCHNL RV SENSING INTR AMPL: 12 mV
MDC IDC PG SERIAL: 7222573
MDC IDC SESS DTM: 20161123070008

## 2015-06-13 ENCOUNTER — Encounter: Payer: Self-pay | Admitting: Cardiology

## 2015-06-22 DIAGNOSIS — Z6823 Body mass index (BMI) 23.0-23.9, adult: Secondary | ICD-10-CM | POA: Diagnosis not present

## 2015-06-22 DIAGNOSIS — I4891 Unspecified atrial fibrillation: Secondary | ICD-10-CM | POA: Diagnosis not present

## 2015-06-22 DIAGNOSIS — Z7901 Long term (current) use of anticoagulants: Secondary | ICD-10-CM | POA: Diagnosis not present

## 2015-06-22 DIAGNOSIS — R0600 Dyspnea, unspecified: Secondary | ICD-10-CM | POA: Diagnosis not present

## 2015-06-22 DIAGNOSIS — I509 Heart failure, unspecified: Secondary | ICD-10-CM | POA: Diagnosis not present

## 2015-06-22 DIAGNOSIS — J302 Other seasonal allergic rhinitis: Secondary | ICD-10-CM | POA: Diagnosis not present

## 2015-06-22 DIAGNOSIS — I10 Essential (primary) hypertension: Secondary | ICD-10-CM | POA: Diagnosis not present

## 2015-06-22 DIAGNOSIS — R0602 Shortness of breath: Secondary | ICD-10-CM | POA: Diagnosis not present

## 2015-06-22 DIAGNOSIS — N182 Chronic kidney disease, stage 2 (mild): Secondary | ICD-10-CM | POA: Diagnosis not present

## 2015-06-22 DIAGNOSIS — R05 Cough: Secondary | ICD-10-CM | POA: Diagnosis not present

## 2015-06-22 DIAGNOSIS — R609 Edema, unspecified: Secondary | ICD-10-CM | POA: Diagnosis not present

## 2015-06-27 DIAGNOSIS — Z6823 Body mass index (BMI) 23.0-23.9, adult: Secondary | ICD-10-CM | POA: Diagnosis not present

## 2015-06-27 DIAGNOSIS — I509 Heart failure, unspecified: Secondary | ICD-10-CM | POA: Diagnosis not present

## 2015-07-04 DIAGNOSIS — R05 Cough: Secondary | ICD-10-CM | POA: Diagnosis not present

## 2015-07-04 DIAGNOSIS — I509 Heart failure, unspecified: Secondary | ICD-10-CM | POA: Diagnosis not present

## 2015-07-04 DIAGNOSIS — I10 Essential (primary) hypertension: Secondary | ICD-10-CM | POA: Diagnosis not present

## 2015-07-04 DIAGNOSIS — J302 Other seasonal allergic rhinitis: Secondary | ICD-10-CM | POA: Diagnosis not present

## 2015-07-04 DIAGNOSIS — J069 Acute upper respiratory infection, unspecified: Secondary | ICD-10-CM | POA: Diagnosis not present

## 2015-07-04 DIAGNOSIS — I4891 Unspecified atrial fibrillation: Secondary | ICD-10-CM | POA: Diagnosis not present

## 2015-07-04 DIAGNOSIS — Z7901 Long term (current) use of anticoagulants: Secondary | ICD-10-CM | POA: Diagnosis not present

## 2015-07-24 DIAGNOSIS — J302 Other seasonal allergic rhinitis: Secondary | ICD-10-CM | POA: Diagnosis not present

## 2015-07-24 DIAGNOSIS — I4891 Unspecified atrial fibrillation: Secondary | ICD-10-CM | POA: Diagnosis not present

## 2015-07-24 DIAGNOSIS — I509 Heart failure, unspecified: Secondary | ICD-10-CM | POA: Diagnosis not present

## 2015-07-24 DIAGNOSIS — R51 Headache: Secondary | ICD-10-CM | POA: Diagnosis not present

## 2015-07-24 DIAGNOSIS — R05 Cough: Secondary | ICD-10-CM | POA: Diagnosis not present

## 2015-07-24 DIAGNOSIS — R609 Edema, unspecified: Secondary | ICD-10-CM | POA: Diagnosis not present

## 2015-07-24 DIAGNOSIS — N182 Chronic kidney disease, stage 2 (mild): Secondary | ICD-10-CM | POA: Diagnosis not present

## 2015-07-24 DIAGNOSIS — J45909 Unspecified asthma, uncomplicated: Secondary | ICD-10-CM | POA: Diagnosis not present

## 2015-07-24 DIAGNOSIS — Z6822 Body mass index (BMI) 22.0-22.9, adult: Secondary | ICD-10-CM | POA: Diagnosis not present

## 2015-07-24 DIAGNOSIS — D539 Nutritional anemia, unspecified: Secondary | ICD-10-CM | POA: Diagnosis not present

## 2015-07-24 DIAGNOSIS — Z7901 Long term (current) use of anticoagulants: Secondary | ICD-10-CM | POA: Diagnosis not present

## 2015-07-24 DIAGNOSIS — I10 Essential (primary) hypertension: Secondary | ICD-10-CM | POA: Diagnosis not present

## 2015-07-25 DIAGNOSIS — I4891 Unspecified atrial fibrillation: Secondary | ICD-10-CM | POA: Diagnosis not present

## 2015-07-25 DIAGNOSIS — R05 Cough: Secondary | ICD-10-CM | POA: Diagnosis not present

## 2015-07-25 DIAGNOSIS — Z6822 Body mass index (BMI) 22.0-22.9, adult: Secondary | ICD-10-CM | POA: Diagnosis not present

## 2015-07-25 DIAGNOSIS — I10 Essential (primary) hypertension: Secondary | ICD-10-CM | POA: Diagnosis not present

## 2015-07-25 DIAGNOSIS — J45909 Unspecified asthma, uncomplicated: Secondary | ICD-10-CM | POA: Diagnosis not present

## 2015-07-25 DIAGNOSIS — I509 Heart failure, unspecified: Secondary | ICD-10-CM | POA: Diagnosis not present

## 2015-07-25 DIAGNOSIS — R51 Headache: Secondary | ICD-10-CM | POA: Diagnosis not present

## 2015-07-25 DIAGNOSIS — J302 Other seasonal allergic rhinitis: Secondary | ICD-10-CM | POA: Diagnosis not present

## 2015-07-25 DIAGNOSIS — R609 Edema, unspecified: Secondary | ICD-10-CM | POA: Diagnosis not present

## 2015-07-25 DIAGNOSIS — Z7901 Long term (current) use of anticoagulants: Secondary | ICD-10-CM | POA: Diagnosis not present

## 2015-07-25 DIAGNOSIS — N182 Chronic kidney disease, stage 2 (mild): Secondary | ICD-10-CM | POA: Diagnosis not present

## 2015-07-25 DIAGNOSIS — D539 Nutritional anemia, unspecified: Secondary | ICD-10-CM | POA: Diagnosis not present

## 2015-08-09 DIAGNOSIS — J111 Influenza due to unidentified influenza virus with other respiratory manifestations: Secondary | ICD-10-CM

## 2015-08-09 HISTORY — DX: Influenza due to unidentified influenza virus with other respiratory manifestations: J11.1

## 2015-08-21 DIAGNOSIS — I10 Essential (primary) hypertension: Secondary | ICD-10-CM | POA: Diagnosis not present

## 2015-08-21 DIAGNOSIS — I482 Chronic atrial fibrillation: Secondary | ICD-10-CM | POA: Diagnosis not present

## 2015-08-21 DIAGNOSIS — Z7901 Long term (current) use of anticoagulants: Secondary | ICD-10-CM | POA: Diagnosis not present

## 2015-08-21 DIAGNOSIS — I4891 Unspecified atrial fibrillation: Secondary | ICD-10-CM | POA: Diagnosis not present

## 2015-08-21 DIAGNOSIS — N182 Chronic kidney disease, stage 2 (mild): Secondary | ICD-10-CM | POA: Diagnosis not present

## 2015-08-21 DIAGNOSIS — I509 Heart failure, unspecified: Secondary | ICD-10-CM | POA: Diagnosis not present

## 2015-08-28 DIAGNOSIS — I10 Essential (primary) hypertension: Secondary | ICD-10-CM | POA: Diagnosis not present

## 2015-08-28 DIAGNOSIS — I4891 Unspecified atrial fibrillation: Secondary | ICD-10-CM | POA: Diagnosis not present

## 2015-08-28 DIAGNOSIS — J90 Pleural effusion, not elsewhere classified: Secondary | ICD-10-CM | POA: Diagnosis not present

## 2015-08-28 DIAGNOSIS — Z6822 Body mass index (BMI) 22.0-22.9, adult: Secondary | ICD-10-CM | POA: Diagnosis not present

## 2015-08-28 DIAGNOSIS — R0600 Dyspnea, unspecified: Secondary | ICD-10-CM | POA: Diagnosis not present

## 2015-08-28 DIAGNOSIS — I509 Heart failure, unspecified: Secondary | ICD-10-CM | POA: Diagnosis not present

## 2015-08-28 DIAGNOSIS — D539 Nutritional anemia, unspecified: Secondary | ICD-10-CM | POA: Diagnosis not present

## 2015-08-30 ENCOUNTER — Encounter (HOSPITAL_COMMUNITY): Payer: Self-pay

## 2015-08-30 ENCOUNTER — Ambulatory Visit (INDEPENDENT_AMBULATORY_CARE_PROVIDER_SITE_OTHER): Payer: Medicare Other | Admitting: *Deleted

## 2015-08-30 ENCOUNTER — Emergency Department (HOSPITAL_COMMUNITY): Payer: Medicare Other

## 2015-08-30 ENCOUNTER — Inpatient Hospital Stay (HOSPITAL_COMMUNITY)
Admission: EM | Admit: 2015-08-30 | Discharge: 2015-09-05 | DRG: 291 | Disposition: A | Discharge status: Home Health | Payer: Medicare Other | Attending: Internal Medicine | Admitting: Internal Medicine

## 2015-08-30 DIAGNOSIS — I2510 Atherosclerotic heart disease of native coronary artery without angina pectoris: Secondary | ICD-10-CM | POA: Diagnosis present

## 2015-08-30 DIAGNOSIS — I481 Persistent atrial fibrillation: Secondary | ICD-10-CM | POA: Diagnosis present

## 2015-08-30 DIAGNOSIS — I509 Heart failure, unspecified: Secondary | ICD-10-CM | POA: Diagnosis not present

## 2015-08-30 DIAGNOSIS — E876 Hypokalemia: Secondary | ICD-10-CM | POA: Diagnosis present

## 2015-08-30 DIAGNOSIS — I482 Chronic atrial fibrillation, unspecified: Secondary | ICD-10-CM | POA: Insufficient documentation

## 2015-08-30 DIAGNOSIS — Z950 Presence of cardiac pacemaker: Secondary | ICD-10-CM

## 2015-08-30 DIAGNOSIS — Z881 Allergy status to other antibiotic agents status: Secondary | ICD-10-CM | POA: Diagnosis not present

## 2015-08-30 DIAGNOSIS — J9600 Acute respiratory failure, unspecified whether with hypoxia or hypercapnia: Secondary | ICD-10-CM | POA: Diagnosis present

## 2015-08-30 DIAGNOSIS — J189 Pneumonia, unspecified organism: Secondary | ICD-10-CM | POA: Diagnosis not present

## 2015-08-30 DIAGNOSIS — Z888 Allergy status to other drugs, medicaments and biological substances status: Secondary | ICD-10-CM

## 2015-08-30 DIAGNOSIS — I5033 Acute on chronic diastolic (congestive) heart failure: Secondary | ICD-10-CM | POA: Diagnosis present

## 2015-08-30 DIAGNOSIS — J45909 Unspecified asthma, uncomplicated: Secondary | ICD-10-CM | POA: Diagnosis present

## 2015-08-30 DIAGNOSIS — F0390 Unspecified dementia without behavioral disturbance: Secondary | ICD-10-CM | POA: Diagnosis not present

## 2015-08-30 DIAGNOSIS — I495 Sick sinus syndrome: Secondary | ICD-10-CM

## 2015-08-30 DIAGNOSIS — J449 Chronic obstructive pulmonary disease, unspecified: Secondary | ICD-10-CM | POA: Diagnosis not present

## 2015-08-30 DIAGNOSIS — K5900 Constipation, unspecified: Secondary | ICD-10-CM | POA: Diagnosis not present

## 2015-08-30 DIAGNOSIS — Z882 Allergy status to sulfonamides status: Secondary | ICD-10-CM

## 2015-08-30 DIAGNOSIS — I081 Rheumatic disorders of both mitral and tricuspid valves: Secondary | ICD-10-CM | POA: Diagnosis present

## 2015-08-30 DIAGNOSIS — I5031 Acute diastolic (congestive) heart failure: Secondary | ICD-10-CM

## 2015-08-30 DIAGNOSIS — D649 Anemia, unspecified: Secondary | ICD-10-CM | POA: Diagnosis present

## 2015-08-30 DIAGNOSIS — J1100 Influenza due to unidentified influenza virus with unspecified type of pneumonia: Secondary | ICD-10-CM | POA: Diagnosis present

## 2015-08-30 DIAGNOSIS — Z8673 Personal history of transient ischemic attack (TIA), and cerebral infarction without residual deficits: Secondary | ICD-10-CM | POA: Diagnosis not present

## 2015-08-30 DIAGNOSIS — J9601 Acute respiratory failure with hypoxia: Secondary | ICD-10-CM | POA: Diagnosis not present

## 2015-08-30 DIAGNOSIS — I10 Essential (primary) hypertension: Secondary | ICD-10-CM | POA: Diagnosis not present

## 2015-08-30 DIAGNOSIS — Z7901 Long term (current) use of anticoagulants: Secondary | ICD-10-CM | POA: Diagnosis not present

## 2015-08-30 DIAGNOSIS — N183 Chronic kidney disease, stage 3 (moderate): Secondary | ICD-10-CM | POA: Diagnosis present

## 2015-08-30 DIAGNOSIS — J90 Pleural effusion, not elsewhere classified: Secondary | ICD-10-CM | POA: Diagnosis not present

## 2015-08-30 DIAGNOSIS — Z7951 Long term (current) use of inhaled steroids: Secondary | ICD-10-CM

## 2015-08-30 DIAGNOSIS — R05 Cough: Secondary | ICD-10-CM

## 2015-08-30 DIAGNOSIS — I130 Hypertensive heart and chronic kidney disease with heart failure and stage 1 through stage 4 chronic kidney disease, or unspecified chronic kidney disease: Principal | ICD-10-CM | POA: Diagnosis present

## 2015-08-30 DIAGNOSIS — I480 Paroxysmal atrial fibrillation: Secondary | ICD-10-CM

## 2015-08-30 DIAGNOSIS — R059 Cough, unspecified: Secondary | ICD-10-CM

## 2015-08-30 DIAGNOSIS — R0602 Shortness of breath: Secondary | ICD-10-CM | POA: Diagnosis not present

## 2015-08-30 HISTORY — DX: Gastro-esophageal reflux disease without esophagitis: K21.9

## 2015-08-30 HISTORY — DX: Influenza due to unidentified influenza virus with other respiratory manifestations: J11.1

## 2015-08-30 LAB — CBC WITH DIFFERENTIAL/PLATELET
BASOS ABS: 0 10*3/uL (ref 0.0–0.1)
BASOS PCT: 0 %
EOS PCT: 0 %
Eosinophils Absolute: 0 10*3/uL (ref 0.0–0.7)
HCT: 34.7 % — ABNORMAL LOW (ref 36.0–46.0)
Hemoglobin: 10.9 g/dL — ABNORMAL LOW (ref 12.0–15.0)
LYMPHS PCT: 10 %
Lymphs Abs: 0.8 10*3/uL (ref 0.7–4.0)
MCH: 27.1 pg (ref 26.0–34.0)
MCHC: 31.4 g/dL (ref 30.0–36.0)
MCV: 86.3 fL (ref 78.0–100.0)
Monocytes Absolute: 1 10*3/uL (ref 0.1–1.0)
Monocytes Relative: 12 %
NEUTROS ABS: 6.5 10*3/uL (ref 1.7–7.7)
Neutrophils Relative %: 78 %
PLATELETS: 187 10*3/uL (ref 150–400)
RBC: 4.02 MIL/uL (ref 3.87–5.11)
RDW: 16.5 % — AB (ref 11.5–15.5)
WBC: 8.3 10*3/uL (ref 4.0–10.5)

## 2015-08-30 LAB — BASIC METABOLIC PANEL
ANION GAP: 13 (ref 5–15)
BUN: 18 mg/dL (ref 6–20)
CALCIUM: 8.9 mg/dL (ref 8.9–10.3)
CO2: 27 mmol/L (ref 22–32)
Chloride: 102 mmol/L (ref 101–111)
Creatinine, Ser: 1.18 mg/dL — ABNORMAL HIGH (ref 0.44–1.00)
GFR, EST AFRICAN AMERICAN: 45 mL/min — AB (ref >60)
GFR, EST NON AFRICAN AMERICAN: 39 mL/min — AB (ref >60)
GLUCOSE: 133 mg/dL — AB (ref 65–99)
POTASSIUM: 3.4 mmol/L — AB (ref 3.5–5.1)
Sodium: 142 mmol/L (ref 135–145)

## 2015-08-30 LAB — BRAIN NATRIURETIC PEPTIDE: B NATRIURETIC PEPTIDE 5: 1082.1 pg/mL — AB (ref 0.0–100.0)

## 2015-08-30 LAB — TROPONIN I: TROPONIN I: 0.12 ng/mL — AB (ref <0.031)

## 2015-08-30 MED ORDER — FUROSEMIDE 10 MG/ML IJ SOLN
20.0000 mg | Freq: Once | INTRAMUSCULAR | Status: AC
  Administered 2015-08-30: 20 mg via INTRAVENOUS
  Filled 2015-08-30: qty 2

## 2015-08-30 MED ORDER — DEXTROSE 5 % IV SOLN
1.0000 g | Freq: Once | INTRAVENOUS | Status: AC
  Administered 2015-08-30: 1 g via INTRAVENOUS
  Filled 2015-08-30: qty 10

## 2015-08-30 MED ORDER — DEXTROSE 5 % IV SOLN
500.0000 mg | Freq: Once | INTRAVENOUS | Status: AC
  Administered 2015-08-30: 500 mg via INTRAVENOUS
  Filled 2015-08-30: qty 500

## 2015-08-30 NOTE — ED Notes (Signed)
Pt taken off bipap. ?

## 2015-08-30 NOTE — ED Provider Notes (Signed)
CSN: [id number]     Arrival date & time 08/30/15  2049 History   First MD Initiated Contact with Patient 08/30/15 2055     Chief Complaint  Patient presents with  . Respiratory Distress    Patient is a 82 y.o. female presenting with shortness of breath. The history is provided by the patient and the EMS personnel. No language interpreter was used.  Shortness of Breath Severity:  Severe Onset quality:  Gradual Duration:  2 days Timing:  Constant Progression:  Worsening Chronicity:  Recurrent Context: URI   Relieved by:  None tried Worsened by:  Nothing tried Ineffective treatments:  Diuretics Associated symptoms: cough   Associated symptoms: no abdominal pain, no chest pain, no ear pain, no fever, no headaches, no neck pain, no rash, no sore throat, no sputum production, no vomiting and no wheezing   Risk factors: no hx of PE/DVT, no recent surgery and no tobacco use     Past Medical History  Diagnosis Date  . Hypertension   . Asthma   . Dementia     mild  . Osteoarthritis   . Gallstones   . Chronic kidney disease     Stage 2 to 3  . Anemia   . Stroke [facility])     Remote R cerebellar stroke with gait abnormaility  . Atrial fibrillation (HCC)     permanent  . Coronary artery disease   . Anticoagulant long-term use   . Chronic diastolic CHF (congestive heart failure) (HCC)   . Valvular heart disease     Mitral and tricuspid insufficiency  . Tachy-brady syndrome (HCC)   . Pacemaker May 2012    VVI per Dr. Johney Frame   Past Surgical History  Procedure Laterality Date  . Abdominal hysterectomy      total  . Appendectomy    . Hemorrhoid surgery    . Cataract extraction, bilateral    . Total knee arthroplasty    . Cholecystectomy    . Pacemaker insertion  May 2012    St Jude Accent SR RF implant. Dr. Johney Frame   Family History  Problem Relation Age of Onset  . Clotting disorder Mother   . Suicidality Father    Social History  Substance Use Topics  . Smoking status:  Never Smoker   . Smokeless tobacco: None  . Alcohol Use: No   OB History    No data available     Review of Systems  Constitutional: Negative for fever, chills, activity change and appetite change.  HENT: Negative for congestion, dental problem, ear pain, facial swelling, hearing loss, rhinorrhea, sneezing, sore throat, trouble swallowing and voice change.   Eyes: Negative for photophobia, pain, redness and visual disturbance.  Respiratory: Positive for cough and shortness of breath. Negative for apnea, sputum production, chest tightness, wheezing and stridor.   Cardiovascular: Negative for chest pain, palpitations and leg swelling.  Gastrointestinal: Negative for nausea, vomiting, abdominal pain, diarrhea, constipation, blood in stool and abdominal distention.  Endocrine: Negative for polydipsia and polyuria.  Genitourinary: Negative for frequency, hematuria, flank pain, decreased urine volume and difficulty urinating.  Musculoskeletal: Negative for back pain, joint swelling, gait problem, neck pain and neck stiffness.  Skin: Negative for rash and wound.  Allergic/Immunologic: Negative for immunocompromised state.  Neurological: Negative for dizziness, syncope, facial asymmetry, speech difficulty, weakness, light-headedness, numbness and headaches.  Hematological: Negative for adenopathy.  Psychiatric/Behavioral: Negative for suicidal ideas, behavioral problems, confusion, sleep disturbance and agitation. The patient is not nervous/anxious.  All other systems reviewed and are negative.     Allergies  Prednisone; Sulfa antibiotics; and Amoxicillin  Home Medications   Prior to Admission medications   Medication Sig Start Date End Date Taking? Authorizing Provider  amLODipine (NORVASC) 2.5 MG tablet Take 1 tablet (2.5 mg total) by mouth daily. 11/28/14  Yes Hillis Range, MD  Calcium Carbonate (CALTRATE 600 PO) Take 1 capsule by mouth 2 (two) times daily.    Yes Historical Provider,  MD  Cyanocobalamin (VITAMIN B-12 PO) Take 1 capsule by mouth daily as needed (takes occasionally).    Yes Historical Provider, MD  docusate sodium (COLACE) 100 MG capsule Take 100 mg by mouth at bedtime.    Yes Historical Provider, MD  donepezil (ARICEPT) 10 MG tablet Take 10 mg by mouth at bedtime.     Yes Historical Provider, MD  fish oil-omega-3 fatty acids 1000 MG capsule Take 1 g by mouth daily.    Yes Historical Provider, MD  Fluticasone-Salmeterol (ADVAIR DISKUS) 250-50 MCG/DOSE AEPB Inhale 1 puff into the lungs every 12 (twelve) hours.     Yes Historical Provider, MD  furosemide (LASIX) 20 MG tablet Take 20 mg by mouth daily. Pt can take 1 extra tablet by mouth daily as needed for weight gain   Yes Historical Provider, MD  Garlic 100 MG TABS Take 1 tablet by mouth daily.    Yes Historical Provider, MD  HYDROcodone-acetaminophen (NORCO) 10-325 MG per tablet Take 1 tablet by mouth every 6 (six) hours as needed (pain).    Yes Historical Provider, MD  metoprolol succinate (TOPROL-XL) 100 MG 24 hr tablet Take 1 tablet (100 mg total) by mouth daily. Take with or immediately following a meal. 09/10/11  Yes Peter M [place], MD  montelukast (SINGULAIR) 10 MG tablet Take 10 mg by mouth at bedtime as needed (for breathing).    Yes Historical Provider, MD  Multiple Vitamins-Minerals (OCUVITE ADULT 50+ PO) Take 1 capsule by mouth daily.    Yes Historical Provider, MD  NAMENDA XR 28 MG CP24 24 hr capsule Take 28 mg by mouth daily. 10/27/14  Yes Historical Provider, MD  Omega-3 Fatty Acids (FISH OIL) 1000 MG CAPS Take 1,000 mg by mouth daily.   Yes Historical Provider, MD  Probiotic Product (ALIGN) 4 MG CAPS Take 4 mg by mouth daily.   Yes Historical Provider, MD  warfarin (COUMADIN) 5 MG tablet Take 5-7.5 mg by mouth daily at 6 PM. 7.5mg  on Mon, Wed and Fri  5mg  all other days   Yes Historical Provider, MD  digoxin (LANOXIN) 0.125 MG tablet Take 1 tablet (0.125 mg total) by mouth daily. 01/22/13   Beatrice Lecher, PA-C   BP 126/89 mmHg  Pulse 50  Temp(Src) 98.8 F (37.1 C) (Oral)  Resp 20  Ht 5\' 7"  (1.702 m)  Wt 63.957 kg  BMI 22.08 kg/m2  SpO2 98% Physical Exam  Constitutional: She is oriented to person, place, and time. She appears well-developed and well-nourished. She appears distressed.  HENT:  Head: Normocephalic and atraumatic.  Right Ear: External ear normal.  Left Ear: External ear normal.  Eyes: Pupils are equal, round, and reactive to light. Right eye exhibits no discharge. Left eye exhibits no discharge.  Neck: Normal range of motion. JVD present. No tracheal deviation present.  Cardiovascular: Regular rhythm and normal heart sounds.  Bradycardia present.  Exam reveals no friction rub.   No murmur heard. Pulmonary/Chest: No stridor. She is in respiratory distress. She has no wheezes.  She has rales.  Abdominal: Soft. Bowel sounds are normal. She exhibits no distension. There is no rebound and no guarding.  Musculoskeletal: Normal range of motion. She exhibits no edema or tenderness.  Lymphadenopathy:    She has no cervical adenopathy.  Neurological: She is alert and oriented to person, place, and time. No cranial nerve deficit. Coordination normal.  Skin: Skin is warm and dry. No rash noted. No pallor.  Psychiatric: She has a normal mood and affect. Her behavior is normal. Judgment and thought content normal.  Nursing note and vitals reviewed.   ED Course  Procedures (including critical care time) Labs Review Labs Reviewed  CBC WITH DIFFERENTIAL/PLATELET - Abnormal; Notable for the following:    Hemoglobin 10.9 (*)    HCT 34.7 (*)    RDW 16.5 (*)    All other components within normal limits  BASIC METABOLIC PANEL - Abnormal; Notable for the following:    Potassium 3.4 (*)    Glucose, Bld 133 (*)    Creatinine, Ser 1.18 (*)    GFR calc non Af Amer 39 (*)    GFR calc Af Amer 45 (*)    All other components within normal limits  BRAIN NATRIURETIC PEPTIDE -  Abnormal; Notable for the following:    B Natriuretic Peptide 1082.1 (*)    All other components within normal limits  TROPONIN I - Abnormal; Notable for the following:    Troponin I 0.12 (*)    All other components within normal limits  CULTURE, BLOOD (ROUTINE X 2)  CULTURE, BLOOD (ROUTINE X 2)    Imaging Review Dg Chest Port 1 View  08/30/2015  CLINICAL DATA:  Increased shortness of Breath EXAM: PORTABLE CHEST 1 VIEW COMPARISON:  01/02/13 FINDINGS: Cardiac shadow is mildly enlarged. A pacing device is again seen stable. New increased density is noted in the right upper lobe consistent with acute pneumonia. Mild left basilar atelectasis is noted. No bony abnormality is seen. IMPRESSION: Acute right upper lobe pneumonia. Followup PA and lateral chest X-ray is recommended in 3-4 weeks following trial of antibiotic therapy to ensure resolution and exclude underlying malignancy. Electronically Signed   By: Alcide Clever M.D.   On: 08/30/2015 21:16   I have personally reviewed and evaluated these images and lab results as part of my medical decision-making.   EKG Interpretation None      MDM   Final diagnoses:  Cough  CAP (community acquired pneumonia)    Patient with history of hypertension, atrial fibrillation, diastolic CHF, pacemaker placement presents via EMS for evaluation of severe respiratory distress. She was placed on BiPAP, given nitroglycerin prior to arrival. She was initially hypertensive but this improved prior to arrival. She has been worsening over the last month and has seen her cardiologist several times was increased her Lasix dose. She daily, stated over the past day prepped and ED visits tonight.  Upon arrival patient afebrile. Heart rate 50s on the monitor. Patient satting 90% on BiPAP. She had bilateral rales JVD.  Differential diagnosis includes CHF exacerbation versus pneumonia.  X-ray with right upper lobe pneumonia as well as some vascular congestion. Patient  given 20 mg IV Lasix, Rocephin/azithromycin for community acquired pneumonia. She was weaned off BiPAP and tolerated nasal cannula oxygen.  EKG paced rhythm, rate 50s.    Pacemaker interrogated which shows only 67% of beats were paced. [employer] to follow-up with patient while inpatient.  Patient is a hospital service for evaluation of community-acquired pneumonia, CHF exacerbation, bradycardia.  Discussed case with my attending Dr. Fayrene Fearing.    Dan Humphreys, MD 08/30/15 2350  Rolland Porter, MD 08/31/15 [id number]

## 2015-08-30 NOTE — ED Notes (Signed)
Patient tolerating off bipap at this time, cough noted, md at bedside.

## 2015-08-30 NOTE — Progress Notes (Signed)
Remote pacemaker transmission.   

## 2015-08-30 NOTE — ED Notes (Signed)
2 weeks ago had increase in lasix, and pt had to much weight loss and reported low K +, then on Monday they decreased the dose.

## 2015-08-30 NOTE — ED Notes (Signed)
Dr. Kakrakandy at bedside. 

## 2015-08-30 NOTE — ED Notes (Signed)
Pt here by [facility] for resp distress, pt has hx of CHF and arrived on CPAP, initial sats were 82 % and now 97 %. Given 1 ntg, and has 20 in left hand, intial breathing was 40 bpm, pt did have cough and intial bp was 170/100 now 140/90

## 2015-08-30 NOTE — ED Notes (Signed)
Patient denies pain and is resting comfortably.  

## 2015-08-30 NOTE — Progress Notes (Signed)
Pt is on BiPAP at this time, coarse aeration and crackles noted. Pt has history of CHF. Family at bedside no distress noted, once NIV applied. Pt is stable at this time,

## 2015-08-31 ENCOUNTER — Encounter (HOSPITAL_COMMUNITY): Payer: Self-pay | Admitting: Internal Medicine

## 2015-08-31 ENCOUNTER — Encounter: Payer: Self-pay | Admitting: Internal Medicine

## 2015-08-31 DIAGNOSIS — J189 Pneumonia, unspecified organism: Secondary | ICD-10-CM

## 2015-08-31 DIAGNOSIS — J9601 Acute respiratory failure with hypoxia: Secondary | ICD-10-CM | POA: Diagnosis present

## 2015-08-31 DIAGNOSIS — I5031 Acute diastolic (congestive) heart failure: Secondary | ICD-10-CM | POA: Diagnosis present

## 2015-08-31 DIAGNOSIS — J9600 Acute respiratory failure, unspecified whether with hypoxia or hypercapnia: Secondary | ICD-10-CM | POA: Diagnosis present

## 2015-08-31 LAB — CBC WITH DIFFERENTIAL/PLATELET
BASOS ABS: 0 10*3/uL (ref 0.0–0.1)
BASOS PCT: 0 %
Eosinophils Absolute: 0 10*3/uL (ref 0.0–0.7)
Eosinophils Relative: 0 %
HEMATOCRIT: 34.1 % — AB (ref 36.0–46.0)
HEMOGLOBIN: 10.5 g/dL — AB (ref 12.0–15.0)
LYMPHS PCT: 13 %
Lymphs Abs: 0.9 10*3/uL (ref 0.7–4.0)
MCH: 26.4 pg (ref 26.0–34.0)
MCHC: 30.8 g/dL (ref 30.0–36.0)
MCV: 85.9 fL (ref 78.0–100.0)
Monocytes Absolute: 0.7 10*3/uL (ref 0.1–1.0)
Monocytes Relative: 9 %
NEUTROS ABS: 5.7 10*3/uL (ref 1.7–7.7)
NEUTROS PCT: 78 %
Platelets: 170 10*3/uL (ref 150–400)
RBC: 3.97 MIL/uL (ref 3.87–5.11)
RDW: 16.3 % — AB (ref 11.5–15.5)
WBC: 7.3 10*3/uL (ref 4.0–10.5)

## 2015-08-31 LAB — TROPONIN I
TROPONIN I: 0.11 ng/mL — AB (ref <0.031)
Troponin I: 0.08 ng/mL — ABNORMAL HIGH (ref <0.031)

## 2015-08-31 LAB — COMPREHENSIVE METABOLIC PANEL
ALBUMIN: 2.7 g/dL — AB (ref 3.5–5.0)
ALK PHOS: 50 U/L (ref 38–126)
ALT: 16 U/L (ref 14–54)
ANION GAP: 15 (ref 5–15)
AST: 27 U/L (ref 15–41)
BILIRUBIN TOTAL: 0.8 mg/dL (ref 0.3–1.2)
BUN: 18 mg/dL (ref 6–20)
CALCIUM: 8.4 mg/dL — AB (ref 8.9–10.3)
CO2: 28 mmol/L (ref 22–32)
Chloride: 100 mmol/L — ABNORMAL LOW (ref 101–111)
Creatinine, Ser: 1.24 mg/dL — ABNORMAL HIGH (ref 0.44–1.00)
GFR, EST AFRICAN AMERICAN: 42 mL/min — AB (ref >60)
GFR, EST NON AFRICAN AMERICAN: 37 mL/min — AB (ref >60)
GLUCOSE: 91 mg/dL (ref 65–99)
Potassium: 3.1 mmol/L — ABNORMAL LOW (ref 3.5–5.1)
Sodium: 143 mmol/L (ref 135–145)
TOTAL PROTEIN: 5.5 g/dL — AB (ref 6.5–8.1)

## 2015-08-31 LAB — INFLUENZA PANEL BY PCR (TYPE A & B)
H1N1 flu by pcr: NOT DETECTED
INFLAPCR: POSITIVE — AB
INFLBPCR: NEGATIVE

## 2015-08-31 LAB — STREP PNEUMONIAE URINARY ANTIGEN: STREP PNEUMO URINARY ANTIGEN: NEGATIVE

## 2015-08-31 LAB — DIGOXIN LEVEL: DIGOXIN LVL: 1.1 ng/mL (ref 0.8–2.0)

## 2015-08-31 LAB — PROTIME-INR
INR: 1.98 — AB (ref 0.00–1.49)
Prothrombin Time: 22.4 seconds — ABNORMAL HIGH (ref 11.6–15.2)

## 2015-08-31 MED ORDER — DEXTROSE 5 % IV SOLN
1.0000 g | INTRAVENOUS | Status: DC
  Administered 2015-08-31 – 2015-09-02 (×3): 1 g via INTRAVENOUS
  Filled 2015-08-31 (×4): qty 10

## 2015-08-31 MED ORDER — MOMETASONE FURO-FORMOTEROL FUM 100-5 MCG/ACT IN AERO
2.0000 | INHALATION_SPRAY | Freq: Two times a day (BID) | RESPIRATORY_TRACT | Status: DC
Start: 2015-08-31 — End: 2015-09-05
  Administered 2015-09-01 – 2015-09-05 (×8): 2 via RESPIRATORY_TRACT
  Filled 2015-08-31 (×2): qty 8.8

## 2015-08-31 MED ORDER — OSELTAMIVIR PHOSPHATE 30 MG PO CAPS
30.0000 mg | ORAL_CAPSULE | ORAL | Status: DC
Start: 2015-08-31 — End: 2015-09-01
  Administered 2015-08-31: 30 mg via ORAL
  Filled 2015-08-31 (×2): qty 1

## 2015-08-31 MED ORDER — METOPROLOL SUCCINATE ER 100 MG PO TB24
100.0000 mg | ORAL_TABLET | Freq: Every day | ORAL | Status: DC
  Administered 2015-08-31 – 2015-09-05 (×6): 100 mg via ORAL
  Filled 2015-08-31 (×2): qty 1
  Filled 2015-08-31: qty 4
  Filled 2015-08-31 (×4): qty 1

## 2015-08-31 MED ORDER — ALIGN 4 MG PO CAPS: 4.0000 mg | ORAL_CAPSULE | Freq: Every day | ORAL | Status: DC

## 2015-08-31 MED ORDER — FUROSEMIDE 10 MG/ML IJ SOLN
40.0000 mg | Freq: Two times a day (BID) | INTRAMUSCULAR | Status: DC
  Administered 2015-08-31 – 2015-09-05 (×11): 40 mg via INTRAVENOUS
  Filled 2015-08-31 (×12): qty 4

## 2015-08-31 MED ORDER — OMEGA-3 FATTY ACIDS 1000 MG PO CAPS: 1.0000 g | ORAL_CAPSULE | Freq: Every day | ORAL | Status: DC

## 2015-08-31 MED ORDER — ACETAMINOPHEN 325 MG PO TABS
650.0000 mg | ORAL_TABLET | Freq: Four times a day (QID) | ORAL | Status: DC | PRN
Start: 2015-08-31 — End: 2015-09-05

## 2015-08-31 MED ORDER — IPRATROPIUM-ALBUTEROL 0.5-2.5 (3) MG/3ML IN SOLN
3.0000 mL | Freq: Four times a day (QID) | RESPIRATORY_TRACT | Status: DC
  Administered 2015-08-31 – 2015-09-01 (×4): 3 mL via RESPIRATORY_TRACT
  Filled 2015-08-31 (×4): qty 3

## 2015-08-31 MED ORDER — ONDANSETRON HCL 4 MG/2ML IJ SOLN: 4.0000 mg | Freq: Four times a day (QID) | INTRAMUSCULAR | Status: DC | PRN

## 2015-08-31 MED ORDER — AMLODIPINE BESYLATE 2.5 MG PO TABS
2.5000 mg | ORAL_TABLET | Freq: Every day | ORAL | Status: DC
  Administered 2015-08-31 – 2015-09-02 (×3): 2.5 mg via ORAL
  Filled 2015-08-31 (×4): qty 1

## 2015-08-31 MED ORDER — DEXTROSE 5 % IV SOLN
500.0000 mg | INTRAVENOUS | Status: DC
  Administered 2015-08-31 – 2015-09-02 (×3): 500 mg via INTRAVENOUS
  Filled 2015-08-31 (×3): qty 500

## 2015-08-31 MED ORDER — HYDROCODONE-ACETAMINOPHEN 10-325 MG PO TABS: 1.0000 | ORAL_TABLET | Freq: Four times a day (QID) | ORAL | Status: DC | PRN

## 2015-08-31 MED ORDER — FISH OIL 1000 MG PO CAPS: 1000.0000 mg | ORAL_CAPSULE | Freq: Every day | ORAL | Status: DC

## 2015-08-31 MED ORDER — ACETAMINOPHEN 650 MG RE SUPP: 650.0000 mg | Freq: Four times a day (QID) | RECTAL | Status: DC | PRN

## 2015-08-31 MED ORDER — DONEPEZIL HCL 10 MG PO TABS
10.0000 mg | ORAL_TABLET | Freq: Every day | ORAL | Status: DC
  Administered 2015-08-31 – 2015-09-04 (×5): 10 mg via ORAL
  Filled 2015-08-31 (×5): qty 1

## 2015-08-31 MED ORDER — WARFARIN - PHARMACIST DOSING INPATIENT
Freq: Every day | Status: DC
  Administered 2015-08-31 – 2015-09-04 (×4)

## 2015-08-31 MED ORDER — OSELTAMIVIR PHOSPHATE 30 MG PO CAPS: 30.0000 mg | ORAL_CAPSULE | Freq: Two times a day (BID) | ORAL | Status: DC

## 2015-08-31 MED ORDER — ONDANSETRON HCL 4 MG PO TABS: 4.0000 mg | ORAL_TABLET | Freq: Four times a day (QID) | ORAL | Status: DC | PRN

## 2015-08-31 MED ORDER — SODIUM CHLORIDE 0.9% FLUSH
3.0000 mL | Freq: Two times a day (BID) | INTRAVENOUS | Status: DC
  Administered 2015-08-31 – 2015-09-05 (×11): 3 mL via INTRAVENOUS

## 2015-08-31 MED ORDER — POTASSIUM CHLORIDE CRYS ER 20 MEQ PO TBCR
40.0000 meq | EXTENDED_RELEASE_TABLET | Freq: Once | ORAL | Status: AC
  Administered 2015-08-31: 40 meq via ORAL
  Filled 2015-08-31: qty 2

## 2015-08-31 MED ORDER — MONTELUKAST SODIUM 10 MG PO TABS
10.0000 mg | ORAL_TABLET | Freq: Every evening | ORAL | Status: DC | PRN
  Filled 2015-08-31: qty 1

## 2015-08-31 MED ORDER — DOCUSATE SODIUM 100 MG PO CAPS
100.0000 mg | ORAL_CAPSULE | Freq: Every day | ORAL | Status: DC
  Administered 2015-08-31 – 2015-09-02 (×3): 100 mg via ORAL
  Filled 2015-08-31 (×3): qty 1

## 2015-08-31 MED ORDER — OSELTAMIVIR PHOSPHATE 75 MG PO CAPS
75.0000 mg | ORAL_CAPSULE | Freq: Two times a day (BID) | ORAL | Status: DC
  Filled 2015-08-31 (×3): qty 1

## 2015-08-31 MED ORDER — WARFARIN SODIUM 7.5 MG PO TABS
7.5000 mg | ORAL_TABLET | Freq: Once | ORAL | Status: AC
  Administered 2015-08-31: 7.5 mg via ORAL
  Filled 2015-08-31 (×2): qty 1

## 2015-08-31 MED ORDER — MEMANTINE HCL ER 28 MG PO CP24
28.0000 mg | ORAL_CAPSULE | Freq: Every day | ORAL | Status: DC
  Administered 2015-08-31 – 2015-09-05 (×6): 28 mg via ORAL
  Filled 2015-08-31 (×6): qty 1

## 2015-08-31 MED ORDER — OMEGA-3-ACID ETHYL ESTERS 1 G PO CAPS
1.0000 g | ORAL_CAPSULE | Freq: Every day | ORAL | Status: DC
  Administered 2015-08-31 – 2015-09-05 (×6): 1 g via ORAL
  Filled 2015-08-31 (×7): qty 1

## 2015-08-31 NOTE — H&P (Signed)
[facility] History and Physical  Susan [name]:[id number] DOB: Dec 12, 1920 DOA: 08/30/2015  Referring physician: ER physician. PCP: Hoyle Sauer, MD  Specialists: [facility] cardiology.  Chief Complaint: Shortness of breath.  HPI: Susan [name] is a 82 y.o. female with history of diastolic CHF, permanent atrial fibrillation, pacemaker placement for sinus node dysfunction, hypertension was brought to the ER after patient became acutely short of breath last evening. Patient has been increasingly short of breath over the last 2 weeks and patient's Lasix dose was recently increased. Over the last 2 days patient also has been having increasing productive cough and generalized body ache for which patient was started on Tamiflu by patient's primary care physician. Last evening patient became acutely short of breath and was brought to the ER and was initially on BiPAP which was slowly weaned off. Patient was given Lasix and chest x-ray shows pneumonia for which patient has been started on empiric antibiotics.   Review of Systems: As presented in the history of presenting illness, rest negative.  Past Medical History  Diagnosis Date  . Hypertension   . Asthma   . Dementia     mild  . Osteoarthritis   . Gallstones   . Chronic kidney disease     Stage 2 to 3  . Anemia   . Stroke [facility])     Remote R cerebellar stroke with gait abnormaility  . Atrial fibrillation (HCC)     permanent  . Coronary artery disease   . Anticoagulant long-term use   . Chronic diastolic CHF (congestive heart failure) (HCC)   . Valvular heart disease     Mitral and tricuspid insufficiency  . Tachy-brady syndrome (HCC)   . Pacemaker May 2012    VVI per Dr. Johney Frame   Past Surgical History  Procedure Laterality Date  . Abdominal hysterectomy      total  . Appendectomy    . Hemorrhoid surgery    . Cataract extraction, bilateral    . Total knee arthroplasty    . Cholecystectomy    . Pacemaker insertion   May 2012    St Jude Accent SR RF implant. Dr. Johney Frame   Social History:  reports that she has never smoked. She does not have any smokeless tobacco history on file. She reports that she does not drink alcohol or use illicit drugs. Where does patient live home. Can patient participate in ADLs? Yes.  Allergies  Allergen Reactions  . Prednisone Other (See Comments)    Long term has reaction; felt like passing out  . Sulfa Antibiotics Nausea And Vomiting  . Amoxicillin Rash    ONLY IN IV (PO OK)    Family History:  Family History  Problem Relation Age of Onset  . Clotting disorder Mother   . Suicidality Father       Prior to Admission medications   Medication Sig Start Date End Date Taking? Authorizing Provider  amLODipine (NORVASC) 2.5 MG tablet Take 1 tablet (2.5 mg total) by mouth daily. 11/28/14  Yes Susan Range, MD  Calcium Carbonate (CALTRATE 600 PO) Take 1 capsule by mouth 2 (two) times daily.    Yes Historical Provider, MD  Cyanocobalamin (VITAMIN B-12 PO) Take 1 capsule by mouth daily as needed (takes occasionally).    Yes Historical Provider, MD  docusate sodium (COLACE) 100 MG capsule Take 100 mg by mouth at bedtime.    Yes Historical Provider, MD  donepezil (ARICEPT) 10 MG tablet Take 10 mg by mouth at  bedtime.     Yes Historical Provider, MD  fish oil-omega-3 fatty acids 1000 MG capsule Take 1 g by mouth daily.    Yes Historical Provider, MD  Fluticasone-Salmeterol (ADVAIR DISKUS) 250-50 MCG/DOSE AEPB Inhale 1 puff into the lungs every 12 (twelve) hours.     Yes Historical Provider, MD  furosemide (LASIX) 20 MG tablet Take 20 mg by mouth daily. Pt can take 1 extra tablet by mouth daily as needed for weight gain   Yes Historical Provider, MD  Garlic 100 MG TABS Take 1 tablet by mouth daily.    Yes Historical Provider, MD  HYDROcodone-acetaminophen (NORCO) 10-325 MG per tablet Take 1 tablet by mouth every 6 (six) hours as needed (pain).    Yes Historical Provider, MD   metoprolol succinate (TOPROL-XL) 100 MG 24 hr tablet Take 1 tablet (100 mg total) by mouth daily. Take with or immediately following a meal. 09/10/11  Yes Susan M [place], MD  montelukast (SINGULAIR) 10 MG tablet Take 10 mg by mouth at bedtime as needed (for breathing).    Yes Historical Provider, MD  Multiple Vitamins-Minerals (OCUVITE ADULT 50+ PO) Take 1 capsule by mouth daily.    Yes Historical Provider, MD  NAMENDA XR 28 MG CP24 24 hr capsule Take 28 mg by mouth daily. 10/27/14  Yes Historical Provider, MD  Omega-3 Fatty Acids (FISH OIL) 1000 MG CAPS Take 1,000 mg by mouth daily.   Yes Historical Provider, MD  Probiotic Product (ALIGN) 4 MG CAPS Take 4 mg by mouth daily.   Yes Historical Provider, MD  warfarin (COUMADIN) 5 MG tablet Take 5-7.5 mg by mouth daily at 6 PM. 7.5mg  on Mon, Wed and Fri   all other days   Yes Historical Provider, MD  digoxin (LANOXIN) 0.125 MG tablet Take 1 tablet (0.125 mg total) by mouth daily. 01/22/13   Susan Lecher, PA-C    Physical Exam: Filed Vitals:   08/30/15 2215 08/30/15 2230 08/30/15 2300 08/30/15 2330  BP: 126/89 133/117 115/69 119/54  Pulse: 50 50 49 47  Temp:      TempSrc:      Resp: Height:      Weight:      SpO2: 98% 95% 95% 94%     General:  Moderately built and nourished.  Eyes: Anicteric no pallor.  ENT: No discharge from the ears eyes nose and mouth.  Neck: Mildly elevated JVD. No mass felt.  Cardiovascular: S1 and S2 heard.  Respiratory: No rhonchi or crepitations.  Abdomen: Soft nontender bowel sounds present.  Skin: No rash.  Musculoskeletal: Mild edema of the lower extremities.  Psychiatric: Appears normal.  Neurologic: Alert awake oriented to time place and person. Moves all extremities.  Labs on Admission:  Basic Metabolic Panel:  Recent Labs Lab 08/30/15 2104  NA 142  K 3.4*  CL 102  CO2 27  GLUCOSE 133*  BUN 18  CREATININE 1.18*  CALCIUM 8.9   Liver Function Tests: No results for  input(s): AST, ALT, ALKPHOS, BILITOT, PROT, ALBUMIN in the last 168 hours. No results for input(s): LIPASE, AMYLASE in the last 168 hours. No results for input(s): AMMONIA in the last 168 hours. CBC:  Recent Labs Lab 08/30/15 2104  WBC 8.3  NEUTROABS 6.5  HGB 10.9*  HCT 34.7*  MCV 86.3  PLT 187   Cardiac Enzymes:  Recent Labs Lab 08/30/15 2104  TROPONINI 0.12*    BNP (last 3 results)  Recent Labs  08/30/15  2104  BNP 1082.1*    ProBNP (last 3 results) No results for input(s): PROBNP in the last 8760 hours.  CBG: No results for input(s): GLUCAP in the last 168 hours.  Radiological Exams on Admission: Dg Chest Port 1 View  08/30/2015  CLINICAL DATA:  Increased shortness of Breath EXAM: PORTABLE CHEST 1 VIEW COMPARISON:  01/02/13 FINDINGS: Cardiac shadow is mildly enlarged. A pacing device is again seen stable. New increased density is noted in the right upper lobe consistent with acute pneumonia. Mild left basilar atelectasis is noted. No bony abnormality is seen. IMPRESSION: Acute right upper lobe pneumonia. Followup PA and lateral chest X-ray is recommended in 3-4 weeks following trial of antibiotic therapy to ensure resolution and exclude underlying malignancy. Electronically Signed   By: Alcide Clever M.D.   On: 08/30/2015 21:16    EKG: Independently reviewed. Junctional rhythm.  Assessment/Plan Principal Problem:   Acute respiratory failure with hypoxia (HCC) Active Problems:   Hypertension   Tachycardia-bradycardia syndrome (HCC)   Pacemaker-St.Jude   CAP (community acquired pneumonia)   Acute diastolic CHF (congestive heart failure) (HCC)   Acute respiratory failure (HCC)   1. Acute hypoxic respiratory failure secondary to combination of pneumonia and CHF - patient has been placed on ceftriaxone and Zithromax. Follow urine Legionella and strep antigen and influenza PCR. For patient's diastolic CHF patient has been placed on Lasix 40 mg IV every 12. Last EF  measured was in 2011 which showed EF of 60-65%. Closely follow intake and output metabolic panel daily weights. 2. History of permanent atrial fibrillation presently rate controlled - continue digoxin (digoxin levels are pending), beta blocker and patient is on Coumadin per pharmacy. Chest 2 vasc score is more than 2. 3. History of pacemaker placement for sinus node disease. 4. Hypertension - continue amlodipine. 5. Anemia - follow CBC.   DVT Prophylaxis Coumadin.  Code Status: Full code.  Family Communication: Patient's daughter.  Disposition Plan: Admit to inpatient.    KAKRAKANDY,ARSHAD N. [facility] Pager [phone].  If 7PM-7AM, please contact night-coverage [email or web address] Password [email or web address] 08/31/2015, 12:20 AM

## 2015-08-31 NOTE — ED Notes (Signed)
Diet ordered for lunch.

## 2015-08-31 NOTE — Progress Notes (Signed)
ANTICOAGULATION CONSULT NOTE - Initial Consult  Pharmacy Consult for Warfarin Indication: atrial fibrillation  Allergies  Allergen Reactions  . Prednisone Other (See Comments)    Long term has reaction; felt like passing out  . Sulfa Antibiotics Nausea And Vomiting  . Amoxicillin Rash    ONLY IN IV (PO OK)    Patient Measurements: Height:  (170.2 cm) Weight: 141 lb (63.957 kg) IBW/kg (Calculated) : 61.6 Heparin Dosing Weight:   Vital Signs: Temp: 98.8 F (37.1 C) (02/22 2055) Temp Source: Oral (02/22 2055) BP: 131/69 mmHg (02/23 0130) Pulse Rate: 49 (02/23 0130)  Labs:  Recent Labs  08/30/15 2104 08/31/15 0404  HGB 10.9* 10.5*  HCT 34.7* 34.1*  PLT 187 170  LABPROT  --  22.4*  INR  --  1.98*  CREATININE 1.18* 1.24*  TROPONINI 0.12*  --     Estimated Creatinine Clearance: 29.6 mL/min (by C-G formula based on Cr of 1.18).   Medical History: Past Medical History  Diagnosis Date  . Hypertension   . Asthma   . Dementia     mild  . Osteoarthritis   . Gallstones   . Chronic kidney disease     Stage 2 to 3  . Anemia   . Stroke [facility])     Remote R cerebellar stroke with gait abnormaility  . Atrial fibrillation (HCC)     permanent  . Coronary artery disease   . Anticoagulant long-term use   . Chronic diastolic CHF (congestive heart failure) (HCC)   . Valvular heart disease     Mitral and tricuspid insufficiency  . Tachy-brady syndrome (HCC)   . Pacemaker May 2012    VVI per Dr. Johney Frame    Medications:   (Not in a hospital admission) Scheduled:  . ALIGN  4 mg Oral Daily  . amLODipine  2.5 mg Oral Daily  . docusate sodium  100 mg Oral QHS  . donepezil  10 mg Oral QHS  . Fish Oil  1,000 mg Oral Daily  . fish oil-omega-3 fatty acids  1 g Oral Daily  . furosemide  40 mg Intravenous Q12H  . memantine  28 mg Oral Daily  . metoprolol succinate  100 mg Oral Daily  . mometasone-formoterol  2 puff Inhalation BID  . oseltamivir  75 mg Oral BID  .  sodium chloride flush  3 mL Intravenous Q12H   Infusions:  . azithromycin    . cefTRIAXone (ROCEPHIN)  IV      Assessment: 82yo female with history of Afib on warfarin presents with respiratory distress. Pharmacy is consulted to dose warfarin for atrial fibrillation.   PTA Warfarin Dose: 7.5mg  MWF and  AODs with last dose 2/22 (missed 2/21 dose)  Goal of Therapy:  INR 2-3 Monitor platelets by anticoagulation protocol: Yes   Plan:  Warfarin 7.5mg  tonight x1 Daily INR Monitor s/sx of bleeding  Arlean Hopping. Newman Pies, PharmD, BCPS Clinical Pharmacist Pager [phone] 08/31/2015,1:59 AM

## 2015-08-31 NOTE — Progress Notes (Signed)
Patient seen and examined, vital stable, continued cough, + wheezing, no edema, flu +, crx with pna, on droplet precaution, continue tamiflu/abx/alsix, add duoneb, patient has history of asthma, but allergic to prednisone. Continue oxygen supplement. Family updated in room.

## 2015-08-31 NOTE — ED Notes (Signed)
Call [place] with room number 1st at [phone], if unable to reach call [place] at [phone] and if unable to reach call [place] at [phone]

## 2015-08-31 NOTE — ED Notes (Signed)
Pt's daughter wanted to leave number in case of an emergency or change in status. Enrique Sack Trull(daughter) [phone]

## 2015-09-01 ENCOUNTER — Inpatient Hospital Stay (HOSPITAL_COMMUNITY): Payer: Medicare Other

## 2015-09-01 DIAGNOSIS — J1100 Influenza due to unidentified influenza virus with unspecified type of pneumonia: Secondary | ICD-10-CM

## 2015-09-01 DIAGNOSIS — I509 Heart failure, unspecified: Secondary | ICD-10-CM

## 2015-09-01 DIAGNOSIS — I10 Essential (primary) hypertension: Secondary | ICD-10-CM

## 2015-09-01 DIAGNOSIS — Z950 Presence of cardiac pacemaker: Secondary | ICD-10-CM

## 2015-09-01 DIAGNOSIS — Z7901 Long term (current) use of anticoagulants: Secondary | ICD-10-CM

## 2015-09-01 DIAGNOSIS — I482 Chronic atrial fibrillation: Secondary | ICD-10-CM

## 2015-09-01 DIAGNOSIS — Z5181 Encounter for therapeutic drug level monitoring: Secondary | ICD-10-CM

## 2015-09-01 LAB — COMPREHENSIVE METABOLIC PANEL
ALT: 16 U/L (ref 14–54)
AST: 32 U/L (ref 15–41)
Albumin: 2.6 g/dL — ABNORMAL LOW (ref 3.5–5.0)
Alkaline Phosphatase: 44 U/L (ref 38–126)
Anion gap: 10 (ref 5–15)
BUN: 22 mg/dL — ABNORMAL HIGH (ref 6–20)
CHLORIDE: 99 mmol/L — AB (ref 101–111)
CO2: 28 mmol/L (ref 22–32)
CREATININE: 1.1 mg/dL — AB (ref 0.44–1.00)
Calcium: 8.1 mg/dL — ABNORMAL LOW (ref 8.9–10.3)
GFR, EST AFRICAN AMERICAN: 49 mL/min — AB (ref >60)
GFR, EST NON AFRICAN AMERICAN: 42 mL/min — AB (ref >60)
Glucose, Bld: 90 mg/dL (ref 65–99)
POTASSIUM: 3.2 mmol/L — AB (ref 3.5–5.1)
SODIUM: 137 mmol/L (ref 135–145)
Total Bilirubin: 1 mg/dL (ref 0.3–1.2)
Total Protein: 5.5 g/dL — ABNORMAL LOW (ref 6.5–8.1)

## 2015-09-01 LAB — CBC
HCT: 31.4 % — ABNORMAL LOW (ref 36.0–46.0)
Hemoglobin: 10.1 g/dL — ABNORMAL LOW (ref 12.0–15.0)
MCH: 27.9 pg (ref 26.0–34.0)
MCHC: 32.2 g/dL (ref 30.0–36.0)
MCV: 86.7 fL (ref 78.0–100.0)
PLATELETS: 163 10*3/uL (ref 150–400)
RBC: 3.62 MIL/uL — AB (ref 3.87–5.11)
RDW: 16.3 % — AB (ref 11.5–15.5)
WBC: 5.9 10*3/uL (ref 4.0–10.5)

## 2015-09-01 LAB — LEGIONELLA ANTIGEN, URINE

## 2015-09-01 LAB — PROTIME-INR
INR: 1.84 — ABNORMAL HIGH (ref 0.00–1.49)
PROTHROMBIN TIME: 21.2 s — AB (ref 11.6–15.2)

## 2015-09-01 LAB — MRSA PCR SCREENING: MRSA BY PCR: NEGATIVE

## 2015-09-01 MED ORDER — POTASSIUM CHLORIDE CRYS ER 20 MEQ PO TBCR
40.0000 meq | EXTENDED_RELEASE_TABLET | Freq: Once | ORAL | Status: AC
  Administered 2015-09-01: 40 meq via ORAL
  Filled 2015-09-01: qty 2

## 2015-09-01 MED ORDER — OSELTAMIVIR PHOSPHATE 30 MG PO CAPS
30.0000 mg | ORAL_CAPSULE | Freq: Two times a day (BID) | ORAL | Status: DC
  Administered 2015-09-01 – 2015-09-03 (×5): 30 mg via ORAL
  Filled 2015-09-01 (×6): qty 1

## 2015-09-01 MED ORDER — WARFARIN SODIUM 7.5 MG PO TABS
7.5000 mg | ORAL_TABLET | Freq: Once | ORAL | Status: AC
  Administered 2015-09-01: 7.5 mg via ORAL
  Filled 2015-09-01: qty 1

## 2015-09-01 MED ORDER — IPRATROPIUM-ALBUTEROL 0.5-2.5 (3) MG/3ML IN SOLN
3.0000 mL | RESPIRATORY_TRACT | Status: DC
  Administered 2015-09-01 – 2015-09-03 (×10): 3 mL via RESPIRATORY_TRACT
  Filled 2015-09-01 (×10): qty 3

## 2015-09-01 MED ORDER — ALBUTEROL SULFATE (2.5 MG/3ML) 0.083% IN NEBU
2.5000 mg | INHALATION_SOLUTION | RESPIRATORY_TRACT | Status: DC | PRN
Start: 2015-09-01 — End: 2015-09-05

## 2015-09-01 NOTE — Progress Notes (Signed)
Increased to 4L [place] due to low sats

## 2015-09-01 NOTE — Progress Notes (Signed)
PROGRESS NOTE  Susan [name] XBJ:[id number] DOB: Mar 30, 1921 DOA: 08/30/2015 PCP: Hoyle Sauer, MD  HPI/Recap of past 24 hours:  Still cough, but feeling better, now on 2liter oxygen  Assessment/Plan: Principal Problem:   Acute respiratory failure with hypoxia (HCC) Active Problems:   Hypertension   Tachycardia-bradycardia syndrome (HCC)   Pacemaker-St.Jude   CAP (community acquired pneumonia)   Acute diastolic CHF (congestive heart failure) (HCC)   Acute respiratory failure (HCC) 1. Acute hypoxic respiratory failure secondary to combination of flu/pneumonia and CHF - patient has been placed on ceftriaxone and Zithromax. negative urine Legionella antigen, negative urine strep antigen, +influenza PCR. Sputum culture pending. For patient's diastolic CHF patient has been placed on Lasix 40 mg IV every 12 (at home patient takes lasix20mg  po qd). Last EF measured was in 2011 which showed EF of 60-65%. Repeat echo this admission with stable LVEF, not able to access diastolic function due to afib. Closely follow intake and output metabolic panel daily weights. 2. History of permanent atrial fibrillation presently rate controlled - continue digoxin (digoxin levels 1.1), beta blocker and patient is on Coumadin per pharmacy. Chest 2 vasc score is more than 2. 3. History of pacemaker placement for sinus node disease. Paced rhythm here in the hospital 4. Hypertension - continue home meds amlodipine/toprol-xl 5. Anemia , normocytic at baseline. 6. Hypokalemia: replace k 7. Dementia continue aricept and nemenda   DVT Prophylaxis Coumadin.   Code Status: full  Family Communication: patient   Disposition Plan: not medically ready for discharge, likely home with home health at discharge   Consultants:  none  Procedures:  none  Antibiotics:  Rocephin/zithro from admission'  tamiflu   Objective: BP 137/52 mmHg  Pulse 54  Temp(Src) 98.7 F (37.1 C) (Oral)  Resp 16  Ht 5'  7" (1.702 m)  Wt 63.8 kg (140 lb 10.5 oz)  BMI 22.02 kg/m2  SpO2 95%  Intake/Output Summary (Last 24 hours) at 09/01/15 1721 Last data filed at 09/01/15 1604  Gross per 24 hour  Intake    300 ml  Output   1150 ml  Net   -850 ml   Filed Weights   08/30/15 2055 08/31/15 1559 09/01/15 0604  Weight: 63.957 kg (141 lb) 63.4 kg (139 lb 12.4 oz) 63.8 kg (140 lb 10.5 oz)    Exam:   General:  NAD  Cardiovascular: paced rhythm  Respiratory: less wheezing, less rhonchi, improved aeration  Abdomen: Soft/ND/NT, positive BS  Musculoskeletal: Edema has resolved  Neuro: dementia at baseline, pleasant, follow commend  Data Reviewed: Basic Metabolic Panel:  Recent Labs Lab 08/30/15 2104 08/31/15 0404 09/01/15 0531  NA 142 143 137  K 3.4* 3.1* 3.2*  CL 102 100* 99*  CO2 GLUCOSE 133* 91 90  BUN 18 18 22*  CREATININE 1.18* 1.24* 1.10*  CALCIUM 8.9 8.4* 8.1*   Liver Function Tests:  Recent Labs Lab 08/31/15 0404 09/01/15 0531  AST 27 32  ALT 16 16  ALKPHOS 50 44  BILITOT 0.8 1.0  PROT 5.5* 5.5*  ALBUMIN 2.7* 2.6*   No results for input(s): LIPASE, AMYLASE in the last 168 hours. No results for input(s): AMMONIA in the last 168 hours. CBC:  Recent Labs Lab 08/30/15 2104 08/31/15 0404 09/01/15 0531  WBC 8.3 7.3 5.9  NEUTROABS 6.5 5.7  --   HGB 10.9* 10.5* 10.1*  HCT 34.7* 34.1* 31.4*  MCV 86.3 85.9 86.7  PLT 187 170 163   Cardiac Enzymes:  Recent Labs Lab 08/30/15 2104 08/31/15 0810 08/31/15 1620  TROPONINI 0.12* 0.11* 0.08*   BNP (last 3 results)  Recent Labs  08/30/15 2104  BNP 1082.1*    ProBNP (last 3 results) No results for input(s): PROBNP in the last 8760 hours.  CBG: No results for input(s): GLUCAP in the last 168 hours.  No results found for this or any previous visit (from the past 240 hour(s)).   Studies: No results found.  Scheduled Meds: . amLODipine  2.5 mg Oral Daily  . azithromycin  500 mg Intravenous Q24H    . cefTRIAXone (ROCEPHIN)  IV  1 g Intravenous Q24H  . docusate sodium  100 mg Oral QHS  . donepezil  10 mg Oral QHS  . furosemide  40 mg Intravenous Q12H  . ipratropium-albuterol  3 mL Nebulization Q4H  . memantine  28 mg Oral Daily  . metoprolol succinate  100 mg Oral Daily  . mometasone-formoterol  2 puff Inhalation BID  . omega-3 acid ethyl esters  1 g Oral Daily  . oseltamivir  30 mg Oral BID  . sodium chloride flush  3 mL Intravenous Q12H  . warfarin  7.5 mg Oral ONCE-1800  . Warfarin - Pharmacist Dosing Inpatient   Does not apply q1800    Continuous Infusions:    Time spent:  Bienvenido Proehl MD, PhD  [facility] Pager [phone]. If 7PM-7AM, please contact night-coverage at [email or web address], password [facility] 09/01/2015, 5:21 PM  LOS: 2 days

## 2015-09-01 NOTE — Evaluation (Addendum)
Physical Therapy Evaluation Patient Details Name: Susan [name] MRN: [id number] DOB: Aug 27, 1920 Today's Date: 09/01/2015   History of Present Illness  82 yo admitted with respiratory failure secondary to CHF and PNA. PMHx: AFib, CHF, pacemaker, HTN, CVA  Clinical Impression  Pt very pleasant and eager to return to prior independent function. Pt with sats 96% on RA at rest with quick drop to 87% on RA with 5 feet of gait and toileting, return to 93% on 2L. Pt able to maintain 94% on 2L with gait and 96% at rest. Pt with decreased activity tolerance, strength, balance, function and cardiopulmonary status who will benefit from acute therapy to maximize mobility, function and strength to decrease burden of care. Recommend daily mobility with nursing staff. Pt educated for energy conservation and activity modification.    Follow Up Recommendations Home health PT;Supervision - Intermittent    Equipment Recommendations  Other (comment) (rollator)    Recommendations for Other Services OT consult     Precautions / Restrictions Precautions Precautions: Fall Precaution Comments: watch sats      Mobility  Bed Mobility Overal bed mobility: Modified Independent                Transfers Overall transfer level: Modified independent                  Ambulation/Gait Ambulation/Gait assistance: Supervision Ambulation Distance (Feet): 200 Feet Assistive device: Rolling walker (2 wheeled) Gait Pattern/deviations: Step-through pattern;Decreased stride length   Gait velocity interpretation: Below normal speed for age/gender General Gait Details: cues for posture and breathing technique. pt initiated gait without DME but reaching for environmental supports and performed better with RW, cues for position in RW. Distance limited by fatigue  Stairs            Wheelchair Mobility    Modified Rankin (Stroke Patients Only)       Balance                                              Pertinent Vitals/Pain Pain Assessment: No/denies pain    Home Living Family/patient expects to be discharged to:: Private residence Living Arrangements: Children Available Help at Discharge: Available PRN/intermittently (dgtr works during the day) Type of Home: House Home Access: Stairs to enter   [employer] of Steps: 4 Home Layout: Two level;Able to live on main level with bedroom/bathroom Home Equipment: Walker - standard;Cane - single point;Bedside commode      Prior Function Level of Independence: Independent         Comments: pt states she recently started taking cane to church and has to stop more frequently with decreased activity tolerance for the last 2 weeks and at least 2 falls in last 2 weeks     Hand Dominance        Extremity/Trunk Assessment   Upper Extremity Assessment: Overall WFL for tasks assessed           Lower Extremity Assessment: Generalized weakness      Cervical / Trunk Assessment: Kyphotic  Communication   Communication: No difficulties  Cognition Arousal/Alertness: Awake/alert Behavior During Therapy: WFL for tasks assessed/performed Overall Cognitive Status: Within Functional Limits for tasks assessed                      General Comments      Exercises  Assessment/Plan    PT Assessment Patient needs continued PT services  PT Diagnosis Difficulty walking   PT Problem List Decreased strength;Decreased activity tolerance;Decreased balance;Decreased mobility;Cardiopulmonary status limiting activity;Decreased knowledge of use of DME  PT Treatment Interventions Gait training;Stair training;Functional mobility training;Therapeutic activities;Therapeutic exercise;Balance training;Patient/family education;DME instruction   PT Goals (Current goals can be found in the Care Plan section) Acute Rehab PT Goals Patient Stated Goal: return home PT Goal Formulation: With patient Time  For Goal Achievement: 09/15/15 Potential to Achieve Goals: Good    Frequency Min 3X/week   Barriers to discharge Decreased caregiver support      Co-evaluation               End of Session Equipment Utilized During Treatment: Gait belt;Oxygen Activity Tolerance: Patient tolerated treatment well Patient left: in bed;with bed alarm set;with call bell/phone within reach Nurse Communication: Mobility status         Time: 1610-9604 PT Time Calculation (min) (ACUTE ONLY): 19 min   Charges:   PT Evaluation $PT Eval Moderate Complexity: 1 Procedure     PT G CodesDelorse Lek 09/01/2015, 12:06 PM Delaney Meigs, PT [phone]

## 2015-09-01 NOTE — Care Management Note (Signed)
Case Management Note  Patient Details  Name: Susan [name] MRN: [id number] Date of Birth: 1921/04/01  Subjective/Objective:                 Very pleasant 82 year old who appears much younger than stated age. Patient lives at home with daughter who is in her 58's and still works as a Media planner for [employer]. Patient has oxygen requirement at this time for PNA and Flu, is receiving Lasix was not oxygen dependent prior to admission.  Patient states that she has a walker and a cane, that she does not always use at home. Patient denies any barriers to care.   Action/Plan:  Patient may need home O2 if unable to wean, to soon to tell at this time. PT eval pending.  Expected Discharge Date:                  Expected Discharge Plan:     In-House Referral:     Discharge planning Services  CM Consult  Post Acute Care Choice:    Choice offered to:     DME Arranged:    DME Agency:     HH Arranged:    HH Agency:     Status of Service:  In process, will continue to follow  Medicare Important Message Given:  Yes Date Medicare IM Given:    Medicare IM give by:    Date Additional Medicare IM Given:    Additional Medicare Important Message give by:     If discussed at Long Length of Stay Meetings, dates discussed:    Additional Comments:  [name], RN 09/01/2015, 2:00 PM

## 2015-09-01 NOTE — Progress Notes (Signed)
ANTICOAGULATION CONSULT NOTE - Follow Up Consult  Pharmacy Consult for Warfarin Indication: atrial fibrillation  Allergies  Allergen Reactions  . Prednisone Other (See Comments)    Long term has reaction; felt like passing out  . Sulfa Antibiotics Nausea And Vomiting  . Amoxicillin Rash    ONLY IN IV (PO OK)    Patient Measurements: Height:  (170.2 cm) Weight: 140 lb 10.5 oz (63.8 kg) IBW/kg (Calculated) : 61.6  Vital Signs: Temp: 97.3 F (36.3 C) (02/24 0604) Temp Source: Oral (02/24 0604) BP: 123/38 mmHg (02/24 1039) Pulse Rate: 58 (02/24 1039)  Labs:  Recent Labs  08/30/15 2104 08/31/15 0404 08/31/15 0810 08/31/15 1620 09/01/15 0531  HGB 10.9* 10.5*  --   --  10.1*  HCT 34.7* 34.1*  --   --  31.4*  PLT 187 170  --   --  163  LABPROT  --  22.4*  --   --  21.2*  INR  --  1.98*  --   --  1.84*  CREATININE 1.18* 1.24*  --   --  1.10*  TROPONINI 0.12*  --  0.11* 0.08*  --     Estimated Creatinine Clearance: 31.7 mL/min (by C-G formula based on Cr of 1.1).   Assessment: 84 YOF who presented on 2/23 with SOB and generalized body aches - started on Rocephin + Azithromycin for CAP, and Tamiflu for influenza +.  The patient was on warfarin PTA for hx Afib - pharmacy was consulted to resume dosing this admission.   The patient's INR on admit was noted to be slightly subtherapeutic however the patient was noted to miss a dose earlier this week on 2/22. INR today remains SUBtherapeutic (INR 1.84 << 1.98, goal of 2-3). CBC stable - no overt s/sx of bleeding noted.   PTA dose was 5 mg daily EXCEPT for 7.5 mg on MWF  Goal of Therapy:  INR 2-3   Plan:  1. Warfarin 7.5 mg x 1 dose at 1800 today 2. Will continue to monitor for any signs/symptoms of bleeding and will follow up with PT/INR in the a.m.   Georgina Pillion, PharmD, BCPS Clinical Pharmacist Pager: [phone] 09/01/2015 11:14 AM

## 2015-09-01 NOTE — Progress Notes (Signed)
SATURATION QUALIFICATIONS: (This note is used to comply with regulatory documentation for home oxygen)  Patient Saturations on Room Air at Rest = 95%  Patient Saturations on Room Air while Ambulating = 87%  Patient Saturations on 2 Liters of oxygen while Ambulating = 94%  Please briefly explain why patient needs home oxygen: pt unable to maintain saturations above 90% with activity without supplemental oxygen  Delaney Meigs, PT [phone]

## 2015-09-01 NOTE — Care Management Important Message (Signed)
Important Message  Patient Details  Name: JANISA LABUS MRN: [id number] Date of Birth: 26-May-1921   Medicare Important Message Given:  Yes    [name], RN 09/01/2015, 2:00 PMImportant Message  Patient Details  Name: VASILIKI SMALDONE MRN: [id number] Date of Birth: 08-15-1920   Medicare Important Message Given:  Yes    [name], RN 09/01/2015, 2:00 PM

## 2015-09-01 NOTE — Progress Notes (Signed)
  Echocardiogram 2D Echocardiogram has been performed.  Arvil Chaco 09/01/2015, 4:06 PM

## 2015-09-02 LAB — EXPECTORATED SPUTUM ASSESSMENT W REFEX TO RESP CULTURE

## 2015-09-02 LAB — EXPECTORATED SPUTUM ASSESSMENT W GRAM STAIN, RFLX TO RESP C

## 2015-09-02 LAB — BASIC METABOLIC PANEL
Anion gap: 11 (ref 5–15)
BUN: 20 mg/dL (ref 6–20)
CALCIUM: 8.4 mg/dL — AB (ref 8.9–10.3)
CHLORIDE: 102 mmol/L (ref 101–111)
CO2: 29 mmol/L (ref 22–32)
CREATININE: 1.18 mg/dL — AB (ref 0.44–1.00)
GFR calc Af Amer: 45 mL/min — ABNORMAL LOW (ref >60)
GFR calc non Af Amer: 39 mL/min — ABNORMAL LOW (ref >60)
GLUCOSE: 91 mg/dL (ref 65–99)
Potassium: 3.5 mmol/L (ref 3.5–5.1)
Sodium: 142 mmol/L (ref 135–145)

## 2015-09-02 LAB — PROTIME-INR
INR: 2.04 — ABNORMAL HIGH (ref 0.00–1.49)
PROTHROMBIN TIME: 22.9 s — AB (ref 11.6–15.2)

## 2015-09-02 LAB — MAGNESIUM: Magnesium: 2 mg/dL (ref 1.7–2.4)

## 2015-09-02 MED ORDER — DM-GUAIFENESIN ER 30-600 MG PO TB12
1.0000 | ORAL_TABLET | Freq: Two times a day (BID) | ORAL | Status: DC
  Administered 2015-09-02 – 2015-09-05 (×7): 1 via ORAL
  Filled 2015-09-02 (×7): qty 1

## 2015-09-02 MED ORDER — WARFARIN SODIUM 5 MG PO TABS
5.0000 mg | ORAL_TABLET | Freq: Once | ORAL | Status: AC
  Administered 2015-09-02: 5 mg via ORAL
  Filled 2015-09-02: qty 1

## 2015-09-02 MED ORDER — AMLODIPINE BESYLATE 5 MG PO TABS
5.0000 mg | ORAL_TABLET | Freq: Every day | ORAL | Status: DC
  Administered 2015-09-03 – 2015-09-05 (×3): 5 mg via ORAL
  Filled 2015-09-02 (×3): qty 1

## 2015-09-02 NOTE — Progress Notes (Signed)
ANTICOAGULATION CONSULT NOTE - Follow Up Consult  Pharmacy Consult for Warfarin Indication: atrial fibrillation  Allergies  Allergen Reactions  . Prednisone Other (See Comments)    Long term has reaction; felt like passing out  . Sulfa Antibiotics Nausea And Vomiting  . Amoxicillin Rash    ONLY IN IV (PO OK)    Patient Measurements: Height:  (170.2 cm) Weight: 137 lb 9.1 oz (62.4 kg) IBW/kg (Calculated) : 61.6  Vital Signs: Temp: 98.4 F (36.9 C) (02/25 0745) Temp Source: Oral (02/25 0745) BP: 169/76 mmHg (02/25 0745) Pulse Rate: 60 (02/25 0745)  Labs:  Recent Labs  08/30/15 2104 08/31/15 0404 08/31/15 0810 08/31/15 1620 09/01/15 0531 09/02/15 0600  HGB 10.9* 10.5*  --   --  10.1*  --   HCT 34.7* 34.1*  --   --  31.4*  --   PLT 187 170  --   --  163  --   LABPROT  --  22.4*  --   --  21.2* 22.9*  INR  --  1.98*  --   --  1.84* 2.04*  CREATININE 1.18* 1.24*  --   --  1.10* 1.18*  TROPONINI 0.12*  --  0.11* 0.08*  --   --     Estimated Creatinine Clearance: 29.6 mL/min (by C-G formula based on Cr of 1.18).   Assessment: 46 YOF who presented on 2/23 with SOB and generalized body aches - started on Rocephin + Azithromycin for CAP, and Tamiflu for influenza +.  The patient was on warfarin PTA for hx Afib - pharmacy was consulted to resume dosing this admission.   The patient's INR on admit was noted to be slightly subtherapeutic however the patient was noted to miss a dose earlier this week on 2/22. INR today is back in the therapeutic range (INR 2.04 << 1.84, goal of 2-3). CBC stable - no overt s/sx of bleeding noted.   PTA dose was 5 mg daily EXCEPT for 7.5 mg on MWF  Goal of Therapy:  INR 2-3   Plan:  1. Warfarin 5 mg x 1 dose at 1800 today 2. Will continue to monitor for any signs/symptoms of bleeding and will follow up with PT/INR in the a.m.   Georgina Pillion, PharmD, BCPS Clinical Pharmacist Pager: [phone] 09/02/2015 10:44 AM

## 2015-09-02 NOTE — Progress Notes (Signed)
PROGRESS NOTE  Susan [name] NWG:[id number] DOB: 1920-09-12 DOA: 08/30/2015 PCP: Susan Sauer, MD  HPI/Recap of past 24 hours:  Report easier to cough, feeling better, now on 2liter oxygen, put on her lipsticks today  Assessment/Plan: Principal Problem:   Acute respiratory failure with hypoxia (HCC) Active Problems:   Hypertension   Tachycardia-bradycardia syndrome (HCC)   Pacemaker-St.Jude   CAP (community acquired pneumonia)   Acute diastolic CHF (congestive heart failure) (HCC)   Acute respiratory failure (HCC) 1. Acute hypoxic respiratory failure secondary to combination of flu/pneumonia and CHF - patient has been placed on ceftriaxone and Zithromax. negative urine Legionella antigen, negative urine strep antigen, +influenza PCR. Sputum culture not done, sample not representative for lower airway sample. For patient's diastolic CHF patient has been placed on Lasix 40 mg IV every 12 (at home patient takes lasix20mg  po qd). Last EF measured was in 2011 which showed EF of 60-65%. Repeat echo this admission with stable LVEF, not able to access diastolic function due to afib. Closely follow intake and output metabolic panel daily weights. 2. History of permanent atrial fibrillation presently rate controlled - continue digoxin (digoxin levels 1.1), beta blocker and patient is on Coumadin per pharmacy. Chest 2 vasc score is more than 2. 3. History of pacemaker placement for sinus node disease. Paced rhythm here in the hospital 4. Hypertension - continue home meds amlodipine/toprol-xl. bp elevated,  increase norvasc. 5. Anemia , normocytic at baseline. 6. Hypokalemia: replace k 7. Dementia continue aricept and nemenda   DVT Prophylaxis Coumadin.   Code Status: full  Family Communication: patient   Disposition Plan: likely home with home health in 1-2 days   Consultants:  none  Procedures:  none  Antibiotics:  Rocephin/zithro from  admission'  tamiflu   Objective: BP 169/76 mmHg  Pulse 60  Temp(Src) 98.4 F (36.9 C) (Oral)  Resp 16  Ht  (1.702 m)  Wt 62.4 kg (137 lb 9.1 oz)  BMI 21.54 kg/m2  SpO2 95%  Intake/Output Summary (Last 24 hours) at 09/02/15 1210 Last data filed at 09/02/15 5784  Gross per 24 hour  Intake    300 ml  Output   1650 ml  Net  -1350 ml   Filed Weights   08/31/15 1559 09/01/15 0604 09/02/15 0700  Weight: 63.4 kg (139 lb 12.4 oz) 63.8 kg (140 lb 10.5 oz) 62.4 kg (137 lb 9.1 oz)    Exam:   General:  NAD  Cardiovascular: paced rhythm  Respiratory: less wheezing, less rhonchi, improved aeration  Abdomen: Soft/ND/NT, positive BS  Musculoskeletal: Edema has resolved  Neuro: dementia at baseline, pleasant, follow commend  Data Reviewed: Basic Metabolic Panel:  Recent Labs Lab 08/30/15 2104 08/31/15 0404 09/01/15 0531 09/02/15 0600  NA 142 143 137 142  K 3.4* 3.1* 3.2* 3.5  CL 102 100* 99* 102  CO2 GLUCOSE 133* 91 90 91  BUN 18 18 22* 20  CREATININE 1.18* 1.24* 1.10* 1.18*  CALCIUM 8.9 8.4* 8.1* 8.4*  MG  --   --   --  2.0   Liver Function Tests:  Recent Labs Lab 08/31/15 0404 09/01/15 0531  AST 27 32  ALT 16 16  ALKPHOS 50 44  BILITOT 0.8 1.0  PROT 5.5* 5.5*  ALBUMIN 2.7* 2.6*   No results for input(s): LIPASE, AMYLASE in the last 168 hours. No results for input(s): AMMONIA in the last 168 hours. CBC:  Recent Labs Lab 08/30/15 2104 08/31/15 0404 09/01/15  0531  WBC 8.3 7.3 5.9  NEUTROABS 6.5 5.7  --   HGB 10.9* 10.5* 10.1*  HCT 34.7* 34.1* 31.4*  MCV 86.3 85.9 86.7  PLT 187 170 163   Cardiac Enzymes:    Recent Labs Lab 08/30/15 2104 08/31/15 0810 08/31/15 1620  TROPONINI 0.12* 0.11* 0.08*   BNP (last 3 results)  Recent Labs  08/30/15 2104  BNP 1082.1*    ProBNP (last 3 results) No results for input(s): PROBNP in the last 8760 hours.  CBG: No results for input(s): GLUCAP in the last 168 hours.  Recent  Results (from the past 240 hour(s))  Blood culture (routine x 2)     Status: None (Preliminary result)   Collection Time: 08/30/15  9:05 PM  Result Value Ref Range Status   Specimen Description BLOOD RIGHT FOREARM  Final   Special Requests BOTTLES DRAWN AEROBIC AND ANAEROBIC  Final   Culture NO GROWTH 2 DAYS  Final   Report Status PENDING  Incomplete  Blood culture (routine x 2)     Status: None (Preliminary result)   Collection Time: 08/30/15  9:40 PM  Result Value Ref Range Status   Specimen Description BLOOD RIGHT ARM  Final   Special Requests BOTTLES DRAWN AEROBIC AND ANAEROBIC  Final   Culture NO GROWTH 2 DAYS  Final   Report Status PENDING  Incomplete  MRSA PCR Screening     Status: None   Collection Time: 09/01/15  6:01 PM  Result Value Ref Range Status   MRSA by PCR NEGATIVE NEGATIVE Final    Comment:        The GeneXpert MRSA Assay (FDA approved for NASAL specimens only), is one component of a comprehensive MRSA colonization surveillance program. It is not intended to diagnose MRSA infection nor to guide or monitor treatment for MRSA infections.      Studies: No results found.  Scheduled Meds: . amLODipine  2.5 mg Oral Daily  . azithromycin  500 mg Intravenous Q24H  . cefTRIAXone (ROCEPHIN)  IV  1 g Intravenous Q24H  . dextromethorphan-guaiFENesin  1 tablet Oral BID  . docusate sodium  100 mg Oral QHS  . donepezil  10 mg Oral QHS  . furosemide  40 mg Intravenous Q12H  . ipratropium-albuterol  3 mL Nebulization Q4H  . memantine  28 mg Oral Daily  . metoprolol succinate  100 mg Oral Daily  . mometasone-formoterol  2 puff Inhalation BID  . omega-3 acid ethyl esters  1 g Oral Daily  . oseltamivir  30 mg Oral BID  . sodium chloride flush  3 mL Intravenous Q12H  . warfarin  5 mg Oral ONCE-1800  . Warfarin - Pharmacist Dosing Inpatient   Does not apply q1800    Continuous Infusions:    Time spent:  Susan Blye MD, PhD  [facility] Pager [phone]. If 7PM-7AM, please contact night-coverage at [email or web address], password [facility] 09/02/2015, 12:10 PM  LOS: 3 days

## 2015-09-03 ENCOUNTER — Inpatient Hospital Stay (HOSPITAL_COMMUNITY): Payer: Medicare Other

## 2015-09-03 DIAGNOSIS — I495 Sick sinus syndrome: Secondary | ICD-10-CM

## 2015-09-03 LAB — BASIC METABOLIC PANEL
Anion gap: 12 (ref 5–15)
BUN: 21 mg/dL — AB (ref 6–20)
CHLORIDE: 101 mmol/L (ref 101–111)
CO2: 29 mmol/L (ref 22–32)
CREATININE: 1.18 mg/dL — AB (ref 0.44–1.00)
Calcium: 8.6 mg/dL — ABNORMAL LOW (ref 8.9–10.3)
GFR calc Af Amer: 45 mL/min — ABNORMAL LOW (ref >60)
GFR calc non Af Amer: 39 mL/min — ABNORMAL LOW (ref >60)
Glucose, Bld: 92 mg/dL (ref 65–99)
POTASSIUM: 3.5 mmol/L (ref 3.5–5.1)
Sodium: 142 mmol/L (ref 135–145)

## 2015-09-03 LAB — PROTIME-INR
INR: 2.26 — ABNORMAL HIGH (ref 0.00–1.49)
Prothrombin Time: 24.8 seconds — ABNORMAL HIGH (ref 11.6–15.2)

## 2015-09-03 MED ORDER — DOXYCYCLINE HYCLATE 100 MG PO TABS
100.0000 mg | ORAL_TABLET | Freq: Two times a day (BID) | ORAL | Status: DC
  Administered 2015-09-03 – 2015-09-05 (×4): 100 mg via ORAL
  Filled 2015-09-03 (×4): qty 1

## 2015-09-03 MED ORDER — SENNOSIDES-DOCUSATE SODIUM 8.6-50 MG PO TABS
2.0000 | ORAL_TABLET | Freq: Two times a day (BID) | ORAL | Status: DC
  Administered 2015-09-03 – 2015-09-04 (×3): 2 via ORAL
  Filled 2015-09-03 (×3): qty 2

## 2015-09-03 MED ORDER — WARFARIN SODIUM 5 MG PO TABS
5.0000 mg | ORAL_TABLET | Freq: Once | ORAL | Status: AC
  Administered 2015-09-03: 5 mg via ORAL
  Filled 2015-09-03: qty 1

## 2015-09-03 MED ORDER — POLYETHYLENE GLYCOL 3350 17 G PO PACK
17.0000 g | PACK | Freq: Once | ORAL | Status: AC
  Administered 2015-09-03: 17 g via ORAL
  Filled 2015-09-03: qty 1

## 2015-09-03 MED ORDER — POTASSIUM CHLORIDE CRYS ER 20 MEQ PO TBCR
40.0000 meq | EXTENDED_RELEASE_TABLET | Freq: Once | ORAL | Status: AC
  Administered 2015-09-03: 40 meq via ORAL
  Filled 2015-09-03: qty 2

## 2015-09-03 MED ORDER — OSELTAMIVIR PHOSPHATE 30 MG PO CAPS
30.0000 mg | ORAL_CAPSULE | Freq: Every day | ORAL | Status: AC
  Administered 2015-09-04: 30 mg via ORAL
  Filled 2015-09-03: qty 1

## 2015-09-03 NOTE — Discharge Instructions (Signed)

## 2015-09-03 NOTE — Progress Notes (Signed)
ANTICOAGULATION CONSULT NOTE - Follow Up Consult  Pharmacy Consult for Warfarin Indication: atrial fibrillation  Allergies  Allergen Reactions  . Prednisone Other (See Comments)    Long term has reaction; felt like passing out  . Sulfa Antibiotics Nausea And Vomiting  . Amoxicillin Rash    ONLY IN IV (PO OK)    Patient Measurements: Height:  (170.2 cm) Weight: 132 lb 7.9 oz (60.1 kg) IBW/kg (Calculated) : 61.6  Vital Signs: Temp: 97.4 F (36.3 C) (02/26 0547) Temp Source: Oral (02/26 0547) BP: 144/67 mmHg (02/26 1059) Pulse Rate: 61 (02/26 1059)  Labs:  Recent Labs  08/31/15 1620 09/01/15 0531 09/02/15 0600 09/03/15 0644  HGB  --  10.1*  --   --   HCT  --  31.4*  --   --   PLT  --  163  --   --   LABPROT  --  21.2* 22.9* 24.8*  INR  --  1.84* 2.04* 2.26*  CREATININE  --  1.10* 1.18* 1.18*  TROPONINI 0.08*  --   --   --     Estimated Creatinine Clearance: 28.9 mL/min (by C-G formula based on Cr of 1.18).   Assessment: 60 YOF who presented on 2/23 with SOB and generalized body aches - started on Rocephin + Azithromycin for CAP >> now transitioned to Doxycycline, and Tamiflu for influenza +.  The patient was on warfarin PTA for hx Afib - pharmacy was consulted to resume dosing this admission.   The patient's INR on admit was noted to be slightly subtherapeutic however the patient was noted to miss a dose earlier this week on 2/22. INR today is back in the therapeutic range (INR 2.26 << 2.04, goal of 2-3). CBC stable - no overt s/sx of bleeding noted.   PTA dose was 5 mg daily EXCEPT for 7.5 mg on MWF  Goal of Therapy:  INR 2-3   Plan:  1. Warfarin 5 mg x 1 dose at 1800 today 2. Adjust Tamiflu to 30 mg daily while CrCl<30 ml/min  3. Will continue to monitor for any signs/symptoms of bleeding and will follow up with PT/INR in the a.m.   Georgina Pillion, PharmD, BCPS Clinical Pharmacist Pager: [phone] 09/03/2015 11:04 AM

## 2015-09-03 NOTE — Progress Notes (Signed)
PROGRESS NOTE  Susan [name] ZOX:[id number] DOB: 1920-08-19 DOA: 08/30/2015 PCP: Hoyle Sauer, MD  HPI/Recap of past 24 hours:  Continue to feel better, now on 2liter oxygen, denies pain, reported no bm since in the hospital. Family not in room  Assessment/Plan: Principal Problem:   Acute respiratory failure with hypoxia (HCC) Active Problems:   Hypertension   Tachycardia-bradycardia syndrome (HCC)   Pacemaker-St.Jude   CAP (community acquired pneumonia)   Acute diastolic CHF (congestive heart failure) (HCC)   Acute respiratory failure (HCC) 1. Acute hypoxic respiratory failure secondary to combination of flu/pneumonia and CHF - patient has been placed on ceftriaxone and Zithromax. negative urine Legionella antigen, negative urine strep antigen, +influenza PCR. Sputum culture not done, sample not representative for lower airway sample. For patient's diastolic CHF patient has been placed on Lasix 40 mg IV every 12 (at home patient takes lasix20mg  po qd). Last EF measured was in 2011 which showed EF of 60-65%. Repeat echo this admission with stable LVEF, not able to access diastolic function due to afib. Closely follow intake and output metabolic panel daily weights. 2/26 patient improving, wean oxygen, start mobilize with PT. Repeat cxr 2 view. Change abx to oral doxycycline.  2. History of permanent atrial fibrillation presently rate controlled - continue digoxin (digoxin levels 1.1), beta blocker and patient is on Coumadin per pharmacy. Chest 2 vasc score is more than 2. 3. History of pacemaker placement for sinus node disease. Paced rhythm here in the hospital 4. Hypertension - continue home meds amlodipine/toprol-xl. bp elevated,  increase norvasc. 5. Anemia , normocytic at baseline. 6. Hypokalemia: replace k 7. Dementia continue aricept and nemenda 8. Constipation: start stool softener.   DVT Prophylaxis Coumadin.   Code Status: full  Family Communication: patient    Disposition Plan: likely home with home health on monday   Consultants:  none  Procedures:  none  Antibiotics:  Rocephin/zithro from admission to 2/26, doxycycline from 2/26  tamiflu   Objective: BP 153/62 mmHg  Pulse 56  Temp(Src) 97.4 F (36.3 C) (Oral)  Resp 17  Ht  (1.702 m)  Wt 60.1 kg (132 lb 7.9 oz)  BMI 20.75 kg/m2  SpO2 97%  Intake/Output Summary (Last 24 hours) at 09/03/15 1026 Last data filed at 09/03/15 0925  Gross per 24 hour  Intake      0 ml  Output   3500 ml  Net  -3500 ml   Filed Weights   09/01/15 0604 09/02/15 0700 09/03/15 0547  Weight: 63.8 kg (140 lb 10.5 oz) 62.4 kg (137 lb 9.1 oz) 60.1 kg (132 lb 7.9 oz)    Exam:   General:  NAD  Cardiovascular: paced rhythm  Respiratory:  wheezing and rhonchi has largely resolved, improved aeration  Abdomen: Soft/ND/NT, positive BS  Musculoskeletal: Edema has resolved  Neuro: dementia at baseline, pleasant, follow command  Data Reviewed: Basic Metabolic Panel:  Recent Labs Lab 08/30/15 2104 08/31/15 0404 09/01/15 0531 09/02/15 0600 09/03/15 0644  NA 142 143 137 142 142  K 3.4* 3.1* 3.2* 3.5 3.5  CL 102 100* 99* 102 101  CO2 GLUCOSE 133* 91 90 91 92  BUN 18 18 22* 20 21*  CREATININE 1.18* 1.24* 1.10* 1.18* 1.18*  CALCIUM 8.9 8.4* 8.1* 8.4* 8.6*  MG  --   --   --  2.0  --    Liver Function Tests:  Recent Labs Lab 08/31/15 0404 09/01/15 0531  AST 27 32  ALT 16 16  ALKPHOS 50 44  BILITOT 0.8 1.0  PROT 5.5* 5.5*  ALBUMIN 2.7* 2.6*   No results for input(s): LIPASE, AMYLASE in the last 168 hours. No results for input(s): AMMONIA in the last 168 hours. CBC:  Recent Labs Lab 08/30/15 2104 08/31/15 0404 09/01/15 0531  WBC 8.3 7.3 5.9  NEUTROABS 6.5 5.7  --   HGB 10.9* 10.5* 10.1*  HCT 34.7* 34.1* 31.4*  MCV 86.3 85.9 86.7  PLT 187 170 163   Cardiac Enzymes:    Recent Labs Lab 08/30/15 2104 08/31/15 0810 08/31/15 1620  TROPONINI  0.12* 0.11* 0.08*   BNP (last 3 results)  Recent Labs  08/30/15 2104  BNP 1082.1*    ProBNP (last 3 results) No results for input(s): PROBNP in the last 8760 hours.  CBG: No results for input(s): GLUCAP in the last 168 hours.  Recent Results (from the past 240 hour(s))  Blood culture (routine x 2)     Status: None (Preliminary result)   Collection Time: 08/30/15  9:05 PM  Result Value Ref Range Status   Specimen Description BLOOD RIGHT FOREARM  Final   Special Requests BOTTLES DRAWN AEROBIC AND ANAEROBIC  Final   Culture NO GROWTH 2 DAYS  Final   Report Status PENDING  Incomplete  Blood culture (routine x 2)     Status: None (Preliminary result)   Collection Time: 08/30/15  9:40 PM  Result Value Ref Range Status   Specimen Description BLOOD RIGHT ARM  Final   Special Requests BOTTLES DRAWN AEROBIC AND ANAEROBIC  Final   Culture NO GROWTH 2 DAYS  Final   Report Status PENDING  Incomplete  MRSA PCR Screening     Status: None   Collection Time: 09/01/15  6:01 PM  Result Value Ref Range Status   MRSA by PCR NEGATIVE NEGATIVE Final    Comment:        The GeneXpert MRSA Assay (FDA approved for NASAL specimens only), is one component of a comprehensive MRSA colonization surveillance program. It is not intended to diagnose MRSA infection nor to guide or monitor treatment for MRSA infections.   Culture, sputum-assessment     Status: None   Collection Time: 09/02/15  1:20 PM  Result Value Ref Range Status   Specimen Description SPUTUM  Final   Special Requests NONE  Final   Sputum evaluation   Final    MICROSCOPIC FINDINGS SUGGEST THAT THIS SPECIMEN IS NOT REPRESENTATIVE OF LOWER RESPIRATORY SECRETIONS. PLEASE RECOLLECT. Gram Stain Report Called to,Read Back By and Verified With: D JOHNSON,RN AT 1511 09/02/15 BY L BENFIELD    Report Status 09/02/2015 FINAL  Final     Studies: No results found.  Scheduled Meds: . amLODipine  5 mg Oral Daily  . azithromycin   500 mg Intravenous Q24H  . cefTRIAXone (ROCEPHIN)  IV  1 g Intravenous Q24H  . dextromethorphan-guaiFENesin  1 tablet Oral BID  . docusate sodium  100 mg Oral QHS  . donepezil  10 mg Oral QHS  . furosemide  40 mg Intravenous Q12H  . memantine  28 mg Oral Daily  . metoprolol succinate  100 mg Oral Daily  . mometasone-formoterol  2 puff Inhalation BID  . omega-3 acid ethyl esters  1 g Oral Daily  . oseltamivir  30 mg Oral BID  . potassium chloride  40 mEq Oral Once  . sodium chloride flush  3 mL Intravenous Q12H  . Warfarin - Pharmacist Dosing Inpatient  Does not apply q1800    Continuous Infusions:    Time spent:  Brittley Regner MD, PhD  [facility] Pager [phone]. If 7PM-7AM, please contact night-coverage at [email or web address], password [facility] 09/03/2015, 10:26 AM  LOS: 4 days

## 2015-09-04 LAB — PROTIME-INR
INR: 2.19 — AB (ref 0.00–1.49)
PROTHROMBIN TIME: 24.2 s — AB (ref 11.6–15.2)

## 2015-09-04 LAB — CBC
HCT: 35 % — ABNORMAL LOW (ref 36.0–46.0)
Hemoglobin: 10.9 g/dL — ABNORMAL LOW (ref 12.0–15.0)
MCH: 26.5 pg (ref 26.0–34.0)
MCHC: 31.1 g/dL (ref 30.0–36.0)
MCV: 85.2 fL (ref 78.0–100.0)
PLATELETS: 203 10*3/uL (ref 150–400)
RBC: 4.11 MIL/uL (ref 3.87–5.11)
RDW: 15.8 % — ABNORMAL HIGH (ref 11.5–15.5)
WBC: 5 10*3/uL (ref 4.0–10.5)

## 2015-09-04 LAB — EXPECTORATED SPUTUM ASSESSMENT W REFEX TO RESP CULTURE

## 2015-09-04 LAB — BASIC METABOLIC PANEL
ANION GAP: 11 (ref 5–15)
BUN: 24 mg/dL — ABNORMAL HIGH (ref 6–20)
CALCIUM: 9 mg/dL (ref 8.9–10.3)
CO2: 30 mmol/L (ref 22–32)
CREATININE: 1.21 mg/dL — AB (ref 0.44–1.00)
Chloride: 103 mmol/L (ref 101–111)
GFR, EST AFRICAN AMERICAN: 44 mL/min — AB (ref >60)
GFR, EST NON AFRICAN AMERICAN: 38 mL/min — AB (ref >60)
GLUCOSE: 101 mg/dL — AB (ref 65–99)
Potassium: 3.8 mmol/L (ref 3.5–5.1)
Sodium: 144 mmol/L (ref 135–145)

## 2015-09-04 LAB — EXPECTORATED SPUTUM ASSESSMENT W GRAM STAIN, RFLX TO RESP C

## 2015-09-04 MED ORDER — IPRATROPIUM-ALBUTEROL 0.5-2.5 (3) MG/3ML IN SOLN: 3.0000 mL | Freq: Four times a day (QID) | RESPIRATORY_TRACT | Status: DC

## 2015-09-04 MED ORDER — BENZONATATE 100 MG PO CAPS
100.0000 mg | ORAL_CAPSULE | Freq: Three times a day (TID) | ORAL | Status: DC | PRN
  Administered 2015-09-04: 100 mg via ORAL
  Filled 2015-09-04: qty 1

## 2015-09-04 MED ORDER — WARFARIN SODIUM 7.5 MG PO TABS
7.5000 mg | ORAL_TABLET | Freq: Once | ORAL | Status: AC
  Administered 2015-09-04: 7.5 mg via ORAL
  Filled 2015-09-04: qty 1

## 2015-09-04 NOTE — Progress Notes (Signed)
PROGRESS NOTE  Susan [name] ZOX:[id number] DOB: Dec 13, 1920 DOA: 08/30/2015 PCP: Hoyle Sauer, MD  HPI/Recap of past 24 hours:  Now on room air, but seems cough more, now cough is productive with blood lots, denies pain,  Granddaughter in room  Assessment/Plan: Principal Problem:   Acute respiratory failure with hypoxia (HCC) Active Problems:   Hypertension   Tachycardia-bradycardia syndrome (HCC)   Pacemaker-St.Jude   CAP (community acquired pneumonia)   Acute diastolic CHF (congestive heart failure) (HCC)   Acute respiratory failure (HCC) 1. Acute hypoxic respiratory failure secondary to combination of flu/pneumonia and CHF - patient has been placed on ceftriaxone and Zithromax. negative urine Legionella antigen, negative urine strep antigen, +influenza PCR. Repeat Sputum culture pending,( first sample not representative for lower airway sample) For patient's diastolic CHF patient has been placed on Lasix 40 mg IV every 12 (at home patient takes lasix20mg  po qd). Last EF measured was in 2011 which showed EF of 60-65%. Repeat echo this admission with stable LVEF, not able to access diastolic function due to afib. Closely follow intake and output metabolic panel daily weights.       2/26 patient improving, wean oxygen, start mobilize with PT. Repeat cxr 2 view. Change abx to oral doxycycline.        2/27 repeat cxr showed improvement but with persistent right upper lobe airspace disease, + small bilateral pleural effusions. Now on room air, but cough more with blood clot, planned discharge held, sputum sample collected, will keep here one more day, monitor INR/Hgb. 2. History of permanent atrial fibrillation presently rate controlled - continue digoxin (digoxin levels 1.1), beta blocker and patient is on Coumadin per pharmacy. Chest 2 vasc score is more than 2. 3. History of pacemaker placement for sinus node disease. Paced rhythm here in the hospital 4. Hypertension - continue home  meds amlodipine/toprol-xl. bp elevated,  increase norvasc. 5. Anemia , normocytic at baseline. 6. Hypokalemia: replace k 7. Dementia continue aricept and nemenda   DVT Prophylaxis Coumadin.   Code Status: full  Family Communication: patient and granddaughter in room  Disposition Plan: home with home health , likely 2/28, if cough better, hgb stable   Consultants:  none  Procedures:  none  Antibiotics:  Rocephin/zithro from admission to 2/26, doxycycline from 2/26  tamiflu   Objective: BP 140/54 mmHg  Pulse 53  Temp(Src) 98.4 F (36.9 C) (Oral)  Resp 16  Ht  (1.702 m)  Wt 56.9 kg (125 lb 7.1 oz)  BMI 19.64 kg/m2  SpO2 97%  Intake/Output Summary (Last 24 hours) at 09/04/15 1240 Last data filed at 09/04/15 1218  Gross per 24 hour  Intake    480 ml  Output   3400 ml  Net  -2920 ml   Filed Weights   09/02/15 0700 09/03/15 0547 09/04/15 0608  Weight: 62.4 kg (137 lb 9.1 oz) 60.1 kg (132 lb 7.9 oz) 56.9 kg (125 lb 7.1 oz)    Exam:   General:  NAD  Cardiovascular: paced rhythm  Respiratory:  wheezing and rhonchi has largely resolved, much improved aeration  Abdomen: Soft/ND/NT, positive BS  Musculoskeletal: Edema has resolved  Neuro: dementia at baseline, pleasant, follow command  Data Reviewed: Basic Metabolic Panel:  Recent Labs Lab 08/31/15 0404 09/01/15 0531 09/02/15 0600 09/03/15 0644 09/04/15 0520  NA 143 137 142 142 144  K 3.1* 3.2* 3.5 3.5 3.8  CL 100* 99* 102 101 103  CO2 GLUCOSE 91 90  91 92 101*  BUN 18 22* 20 21* 24*  CREATININE 1.24* 1.10* 1.18* 1.18* 1.21*  CALCIUM 8.4* 8.1* 8.4* 8.6* 9.0  MG  --   --  2.0  --   --    Liver Function Tests:  Recent Labs Lab 08/31/15 0404 09/01/15 0531  AST 27 32  ALT 16 16  ALKPHOS 50 44  BILITOT 0.8 1.0  PROT 5.5* 5.5*  ALBUMIN 2.7* 2.6*   No results for input(s): LIPASE, AMYLASE in the last 168 hours. No results for input(s): AMMONIA in the last 168  hours. CBC:  Recent Labs Lab 08/30/15 2104 08/31/15 0404 09/01/15 0531 09/04/15 0520  WBC 8.3 7.3 5.9 5.0  NEUTROABS 6.5 5.7  --   --   HGB 10.9* 10.5* 10.1* 10.9*  HCT 34.7* 34.1* 31.4* 35.0*  MCV 86.3 85.9 86.7 85.2  PLT 187 170 163 203   Cardiac Enzymes:    Recent Labs Lab 08/30/15 2104 08/31/15 0810 08/31/15 1620  TROPONINI 0.12* 0.11* 0.08*   BNP (last 3 results)  Recent Labs  08/30/15 2104  BNP 1082.1*    ProBNP (last 3 results) No results for input(s): PROBNP in the last 8760 hours.  CBG: No results for input(s): GLUCAP in the last 168 hours.  Recent Results (from the past 240 hour(s))  Blood culture (routine x 2)     Status: None (Preliminary result)   Collection Time: 08/30/15  9:05 PM  Result Value Ref Range Status   Specimen Description BLOOD RIGHT FOREARM  Final   Special Requests BOTTLES DRAWN AEROBIC AND ANAEROBIC  Final   Culture NO GROWTH 3 DAYS  Final   Report Status PENDING  Incomplete  Blood culture (routine x 2)     Status: None (Preliminary result)   Collection Time: 08/30/15  9:40 PM  Result Value Ref Range Status   Specimen Description BLOOD RIGHT ARM  Final   Special Requests BOTTLES DRAWN AEROBIC AND ANAEROBIC  Final   Culture NO GROWTH 3 DAYS  Final   Report Status PENDING  Incomplete  MRSA PCR Screening     Status: None   Collection Time: 09/01/15  6:01 PM  Result Value Ref Range Status   MRSA by PCR NEGATIVE NEGATIVE Final    Comment:        The GeneXpert MRSA Assay (FDA approved for NASAL specimens only), is one component of a comprehensive MRSA colonization surveillance program. It is not intended to diagnose MRSA infection nor to guide or monitor treatment for MRSA infections.   Culture, sputum-assessment     Status: None   Collection Time: 09/02/15  1:20 PM  Result Value Ref Range Status   Specimen Description SPUTUM  Final   Special Requests NONE  Final   Sputum evaluation   Final    MICROSCOPIC  FINDINGS SUGGEST THAT THIS SPECIMEN IS NOT REPRESENTATIVE OF LOWER RESPIRATORY SECRETIONS. PLEASE RECOLLECT. Gram Stain Report Called to,Read Back By and Verified With: D JOHNSON,RN AT 1511 09/02/15 BY L BENFIELD    Report Status 09/02/2015 FINAL  Final  Culture, expectorated sputum-assessment     Status: None   Collection Time: 09/04/15 11:29 AM  Result Value Ref Range Status   Specimen Description SPUTUM  Final   Special Requests NONE  Final   Sputum evaluation   Final    THIS SPECIMEN IS ACCEPTABLE. RESPIRATORY CULTURE REPORT TO FOLLOW.   Report Status 09/04/2015 FINAL  Final     Studies: Dg Chest 2 View  09/03/2015  CLINICAL DATA:  Pneumonia EXAM: CHEST  2 VIEW COMPARISON:  08/30/2015 FINDINGS: There is hyperinflation of the lungs compatible with COPD. Cardiomegaly. Small bilateral pleural effusions. Right upper lobe airspace disease concerning for pneumonia. Probable scarring in the left upper lobe. Left pacer in place, unchanged. Low IMPRESSION: Cardiomegaly, COPD. Right upper lobe airspace disease concerning for pneumonia. Small bilateral pleural effusions. Electronically Signed   By: Charlett Nose M.D.   On: 09/03/2015 14:16    Scheduled Meds: . amLODipine  5 mg Oral Daily  . dextromethorphan-guaiFENesin  1 tablet Oral BID  . donepezil  10 mg Oral QHS  . doxycycline  100 mg Oral Q12H  . furosemide  40 mg Intravenous Q12H  . memantine  28 mg Oral Daily  . metoprolol succinate  100 mg Oral Daily  . mometasone-formoterol  2 puff Inhalation BID  . omega-3 acid ethyl esters  1 g Oral Daily  . senna-docusate  2 tablet Oral BID  . sodium chloride flush  3 mL Intravenous Q12H  . warfarin  7.5 mg Oral ONCE-1800  . Warfarin - Pharmacist Dosing Inpatient   Does not apply q1800    Continuous Infusions:    Time spent:  Troi Florendo MD, PhD  [facility] Pager [phone]. If 7PM-7AM, please contact night-coverage at [email or web address], password [facility] 09/04/2015, 12:40 PM  LOS: 5  days

## 2015-09-04 NOTE — Progress Notes (Signed)
SATURATION QUALIFICATIONS: (This note is used to comply with regulatory documentation for home oxygen)  Patient Saturations on Room Air at Rest = 98%  Patient Saturations on Room Air while Ambulating = 98%  Patient Saturations on 0 Liters of oxygen while Ambulating = 98%  Please briefly explain why patient needs home oxygen:  Patient will not need oxygen to go home on

## 2015-09-04 NOTE — Progress Notes (Signed)
ANTICOAGULATION CONSULT NOTE - Follow Up Consult  Pharmacy Consult for Warfarin Indication: atrial fibrillation  Allergies  Allergen Reactions  . Prednisone Other (See Comments)    Long term has reaction; felt like passing out  . Sulfa Antibiotics Nausea And Vomiting  . Amoxicillin Rash    ONLY IN IV (PO OK)    Patient Measurements: Height:  (170.2 cm) Weight: 125 lb 7.1 oz (56.9 kg) IBW/kg (Calculated) : 61.6  Vital Signs: Temp: 98.4 F (36.9 C) (02/27 0610) Temp Source: Oral (02/27 0610) BP: 140/54 mmHg (02/27 0610) Pulse Rate: 53 (02/27 0610)  Labs:  Recent Labs  09/02/15 0600 09/03/15 0644 09/04/15 0520  HGB  --   --  10.9*  HCT  --   --  35.0*  PLT  --   --  203  LABPROT 22.9* 24.8* 24.2*  INR 2.04* 2.26* 2.19*  CREATININE 1.18* 1.18* 1.21*    Estimated Creatinine Clearance: 26.6 mL/min (by C-G formula based on Cr of 1.21).   Assessment: 89 YOF who presented on 2/23 with SOB and generalized body aches - started on Rocephin + Azithromycin for CAP >> now transitioned to Doxycycline, and Tamiflu for influenza +.  The patient was on warfarin PTA for hx Afib - pharmacy was consulted to resume dosing this admission.   The patient's INR on admit was noted to be slightly subtherapeutic however the patient was noted to miss a dose earlier this week on 2/22. INR today is back in the therapeutic range  CBC stable - no overt s/sx of bleeding noted.   PTA dose was 5 mg daily EXCEPT for 7.5 mg on MWF  Goal of Therapy:  INR 2-3   Plan:  Coumadin 7.5 mg po x 1 dose tonight If home - discharge on home dose of 5 mg po daily except 7.5 mg MWF  Thank you Okey Regal, PharmD [phone] 09/04/2015 9:00 AM

## 2015-09-05 DIAGNOSIS — I482 Chronic atrial fibrillation, unspecified: Secondary | ICD-10-CM | POA: Insufficient documentation

## 2015-09-05 DIAGNOSIS — F0390 Unspecified dementia without behavioral disturbance: Secondary | ICD-10-CM

## 2015-09-05 LAB — CULTURE, BLOOD (ROUTINE X 2)
CULTURE: NO GROWTH
Culture: NO GROWTH

## 2015-09-05 LAB — BASIC METABOLIC PANEL
Anion gap: 10 (ref 5–15)
BUN: 27 mg/dL — ABNORMAL HIGH (ref 6–20)
CO2: 32 mmol/L (ref 22–32)
Calcium: 9.6 mg/dL (ref 8.9–10.3)
Chloride: 101 mmol/L (ref 101–111)
Creatinine, Ser: 1.27 mg/dL — ABNORMAL HIGH (ref 0.44–1.00)
GFR calc Af Amer: 41 mL/min — ABNORMAL LOW (ref >60)
GFR calc non Af Amer: 36 mL/min — ABNORMAL LOW (ref >60)
Glucose, Bld: 131 mg/dL — ABNORMAL HIGH (ref 65–99)
Potassium: 3.5 mmol/L (ref 3.5–5.1)
Sodium: 143 mmol/L (ref 135–145)

## 2015-09-05 LAB — CBC
HCT: 39.6 % (ref 36.0–46.0)
Hemoglobin: 12.8 g/dL (ref 12.0–15.0)
MCH: 27.6 pg (ref 26.0–34.0)
MCHC: 32.3 g/dL (ref 30.0–36.0)
MCV: 85.3 fL (ref 78.0–100.0)
PLATELETS: 255 10*3/uL (ref 150–400)
RBC: 4.64 MIL/uL (ref 3.87–5.11)
RDW: 15.9 % — AB (ref 11.5–15.5)
WBC: 5.3 10*3/uL (ref 4.0–10.5)

## 2015-09-05 LAB — PROTIME-INR
INR: 2.31 — AB (ref 0.00–1.49)
Prothrombin Time: 25.2 seconds — ABNORMAL HIGH (ref 11.6–15.2)

## 2015-09-05 MED ORDER — FUROSEMIDE 20 MG PO TABS: 40.0000 mg | ORAL_TABLET | Freq: Every day | ORAL | Status: DC

## 2015-09-05 MED ORDER — DOXYCYCLINE HYCLATE 100 MG PO TABS: 100.0000 mg | ORAL_TABLET | Freq: Two times a day (BID) | ORAL | Status: DC

## 2015-09-05 MED ORDER — WARFARIN SODIUM 5 MG PO TABS
5.0000 mg | ORAL_TABLET | ORAL | Status: DC
Start: 2015-09-05 — End: 2015-09-05

## 2015-09-05 MED ORDER — BENZONATATE 100 MG PO CAPS: 100.0000 mg | ORAL_CAPSULE | Freq: Three times a day (TID) | ORAL | Status: DC | PRN

## 2015-09-05 MED ORDER — WARFARIN SODIUM 7.5 MG PO TABS: 7.5000 mg | ORAL_TABLET | ORAL | Status: DC

## 2015-09-05 NOTE — Discharge Summary (Signed)
Discharge Summary  ESTY AHUJA ZOX:[id number] DOB: 1921/03/26  PCP: Hoyle Sauer, MD  Admit date: 08/30/2015 Discharge date: 09/05/2015  Time spent: <65mins  Recommendations for Outpatient Follow-up:  1. F/u with PMD within a week  for hospital discharge follow up, repeat cbc/bmp at follow up, PMD to  Monitor INR closely. Repeat cxr in 4-6 weeks to ensure pneumonia resolution. pmd to follow up on final sputum culture result,   Discharge Diagnoses:  Active Hospital Problems   Diagnosis Date Noted  . Acute respiratory failure with hypoxia (HCC) 08/31/2015  . Acute diastolic CHF (congestive heart failure) (HCC) 08/31/2015  . Acute respiratory failure (HCC) 08/31/2015  . CAP (community acquired pneumonia) 08/30/2015  . Pacemaker-St.Jude 05/26/2012  . Tachycardia-bradycardia syndrome (HCC) 11/29/2010  . Hypertension     Resolved Hospital Problems   Diagnosis Date Noted Date Resolved  No resolved problems to display.    Discharge Condition: stable  Diet recommendation: heart healthy  Filed Weights   09/03/15 0547 09/04/15 0608 09/05/15 0607  Weight: 60.1 kg (132 lb 7.9 oz) 56.9 kg (125 lb 7.1 oz) 57.7 kg (127 lb 3.3 oz)    History of present illness:  UBAH RADKE is a 82 y.o. female with history of diastolic CHF, permanent atrial fibrillation, pacemaker placement for sinus node dysfunction, hypertension was brought to the ER after patient became acutely short of breath last evening. Patient has been increasingly short of breath over the last 2 weeks and patient's Lasix dose was recently increased. Over the last 2 days patient also has been having increasing productive cough and generalized body ache for which patient was started on Tamiflu by patient's primary care physician. Last evening patient became acutely short of breath and was brought to the ER and was initially on BiPAP which was slowly weaned off. Patient was given Lasix and chest x-ray shows pneumonia for which  patient has been started on empiric antibiotics.   Hospital Course:  Principal Problem:   Acute respiratory failure with hypoxia (HCC) Active Problems:   Hypertension   Tachycardia-bradycardia syndrome (HCC)   Pacemaker-St.Jude   CAP (community acquired pneumonia)   Acute diastolic CHF (congestive heart failure) (HCC)   Acute respiratory failure (HCC)   Acute hypoxic respiratory failure secondary to combination of flu/pneumonia and CHF - patient has been placed on ceftriaxone and Zithromax. negative urine Legionella antigen, negative urine strep antigen, +influenza PCR. Repeat Sputum culture pending,( first sample not representative for lower airway sample) For patient's diastolic CHF patient has been placed on Lasix 40 mg IV every 12 (at home patient takes lasix20mg  po qd). Last EF measured was in 2011 which showed EF of 60-65%. Repeat echo this admission with stable LVEF, not able to access diastolic function due to afib. Closely follow intake and output metabolic panel daily weights.  2/26 patient improving, wean oxygen, start mobilize with PT. Repeat cxr 2 view. Change abx to oral doxycycline.   2/27 repeat cxr showed improvement but with persistent right upper lobe airspace disease, + small bilateral pleural effusions. Now on room air, but cough more with blood clot, planned discharge held, sputum sample collected, will keep here one more day, monitor INR/Hgb.       2/28 cough better, hgb stable, d/c home with three more days of abx (doxcycyclin), close monitor INR. 2. History of permanent atrial fibrillation presently rate controlled - continue digoxin (digoxin levels 1.1), beta blocker and patient is on Coumadin per pharmacy. Chest 2 vasc score is more than 2. 3.  History of pacemaker placement for sinus node disease. Paced rhythm here in the hospital 4. Hypertension - continue home meds amlodipine/toprol-xl. bp elevated, required hight dose of  norvasc in the hospital, bp  better, discharge home with home dose bp meds. 5. Anemia , normocytic at baseline. 6. Hypokalemia: replaced k 7. Dementia continue aricept and nemenda   DVT Prophylaxis Coumadin.   Code Status: full  Family Communication: patient in room and daughter over the phone  Disposition Plan: home with home health on 2/28   Consultants:  none  Procedures:  none  Antibiotics:  Rocephin/zithro from admission to 2/26, doxycycline from 2/26  tamiflu   Discharge Exam: BP 93/64 mmHg  Pulse 51  Temp(Src) 98.3 F (36.8 C) (Oral)  Resp 16  Ht 5\' 7"  (1.702 m)  Wt 57.7 kg (127 lb 3.3 oz)  BMI 19.92 kg/m2  SpO2 96%   General: NAD  Cardiovascular: paced rhythm  Respiratory: wheezing and rhonchi has largely resolved, much improved aeration  Abdomen: Soft/ND/NT, positive BS  Musculoskeletal: Edema has resolved  Neuro: dementia at baseline, pleasant, follow command   Discharge Instructions You were cared for by a hospitalist during your hospital stay. If you have any questions about your discharge medications or the care you received while you were in the hospital after you are discharged, you can call the unit and asked to speak with the hospitalist on call if the hospitalist that took care of you is not available. Once you are discharged, your primary care physician will handle any further medical issues. Please note that NO REFILLS for any discharge medications will be authorized once you are discharged, as it is imperative that you return to your primary care physician (or establish a relationship with a primary care physician if you do not have one) for your aftercare needs so that they can reassess your need for medications and monitor your lab values.      Discharge Instructions    Diet - low sodium heart healthy    Complete by:  As directed      Face-to-face encounter (required for Medicare/Medicaid patients)    Complete by:  As directed   I Shamina Etheridge certify that this  patient is under my care and that I, or a nurse practitioner or physician's assistant working with me, had a face-to-face encounter that meets the physician face-to-face encounter requirements with this patient on 09/05/2015. The encounter with the patient was in whole, or in part for the following medical condition(s) which is the primary reason for home health care (List medical condition): FTT  The encounter with the patient was in whole, or in part, for the following medical condition, which is the primary reason for home health care:  FTT  I certify that, based on my findings, the following services are medically necessary home health services:  Physical therapy  Reason for Medically Necessary Home Health Services:  Skilled Nursing- Change/Decline in Patient Status  My clinical findings support the need for the above services:  Shortness of breath with activity  Further, I certify that my clinical findings support that this patient is homebound due to:  Unsafe ambulation due to balance issues     Home Health    Complete by:  As directed   To provide the following care/treatments:  PT     Increase activity slowly    Complete by:  As directed             Medication List    TAKE  these medications        ADVAIR DISKUS 250-50 MCG/DOSE Aepb  Generic drug:  Fluticasone-Salmeterol  Inhale 1 puff into the lungs every 12 (twelve) hours.     ALIGN 4 MG Caps  Take 4 mg by mouth daily.     amLODipine 2.5 MG tablet  Commonly known as:  NORVASC  Take 1 tablet (2.5 mg total) by mouth daily.     benzonatate 100 MG capsule  Commonly known as:  TESSALON  Take 1 capsule (100 mg total) by mouth 3 (three) times daily as needed for cough.     CALTRATE 600 PO  Take 1 capsule by mouth 2 (two) times daily.     digoxin 0.125 MG tablet  Commonly known as:  LANOXIN  Take 1 tablet (0.125 mg total) by mouth daily.     docusate sodium 100 MG capsule  Commonly known as:  COLACE  Take 100 mg by mouth  at bedtime.     donepezil 10 MG tablet  Commonly known as:  ARICEPT  Take 10 mg by mouth at bedtime.     doxycycline 100 MG tablet  Commonly known as:  VIBRA-TABS  Take 1 tablet (100 mg total) by mouth 2 (two) times daily.     fish oil-omega-3 fatty acids 1000 MG capsule  Take 1 g by mouth daily.     furosemide 20 MG tablet  Commonly known as:  LASIX  Take 2 tablets (40 mg total) by mouth daily. Pt can take 1 extra tablet by mouth daily as needed for weight gain     Garlic 100 MG Tabs  Take 1 tablet by mouth daily.     HYDROcodone-acetaminophen 10-325 MG tablet  Commonly known as:  NORCO  Take 1 tablet by mouth every 6 (six) hours as needed (pain).     metoprolol succinate 100 MG 24 hr tablet  Commonly known as:  TOPROL-XL  Take 1 tablet (100 mg total) by mouth daily. Take with or immediately following a meal.     montelukast 10 MG tablet  Commonly known as:  SINGULAIR  Take 10 mg by mouth at bedtime as needed (for breathing).     NAMENDA XR 28 MG Cp24 24 hr capsule  Generic drug:  memantine  Take 28 mg by mouth daily.     OCUVITE ADULT 50+ PO  Take 1 capsule by mouth daily.     VITAMIN B-12 PO  Take 1 capsule by mouth daily as needed (takes occasionally).     warfarin 5 MG tablet  Commonly known as:  COUMADIN  Take 5-7.5 mg by mouth daily at 6 PM. 7.5mg  on Mon, Wed and Fri  5mg  all other days       Allergies  Allergen Reactions  . Prednisone Other (See Comments)    Long term has reaction; felt like passing out  . Sulfa Antibiotics Nausea And Vomiting  . Amoxicillin Rash    ONLY IN IV (PO OK)   Follow-up Information    Follow up with Hoyle Sauer, MD In 1 week.   Specialty:  Internal Medicine   Why:  hospital discharge follow up, pmd to repeat cxr in 3weeks. INR monitor every three days for three times   Contact information:   [address] [place] [address] [phone]       Follow up with [facility].   Why:  For [facility] PT, they  will call you 1-2 days after discharge to set up your first home  visit.   Contact information:   [address] [place] [address] [phone]        The results of significant diagnostics from this hospitalization (including imaging, microbiology, ancillary and laboratory) are listed below for reference.    Significant Diagnostic Studies: Dg Chest 2 View  09/03/2015  CLINICAL DATA:  Pneumonia EXAM: CHEST  2 VIEW COMPARISON:  08/30/2015 FINDINGS: There is hyperinflation of the lungs compatible with COPD. Cardiomegaly. Small bilateral pleural effusions. Right upper lobe airspace disease concerning for pneumonia. Probable scarring in the left upper lobe. Left pacer in place, unchanged. Low IMPRESSION: Cardiomegaly, COPD. Right upper lobe airspace disease concerning for pneumonia. Small bilateral pleural effusions. Electronically Signed   By: Charlett Nose M.D.   On: 09/03/2015 14:16   Dg Chest Port 1 View  08/30/2015  CLINICAL DATA:  Increased shortness of Breath EXAM: PORTABLE CHEST 1 VIEW COMPARISON:  01/02/13 FINDINGS: Cardiac shadow is mildly enlarged. A pacing device is again seen stable. New increased density is noted in the right upper lobe consistent with acute pneumonia. Mild left basilar atelectasis is noted. No bony abnormality is seen. IMPRESSION: Acute right upper lobe pneumonia. Followup PA and lateral chest X-ray is recommended in 3-4 weeks following trial of antibiotic therapy to ensure resolution and exclude underlying malignancy. Electronically Signed   By: Alcide Clever M.D.   On: 08/30/2015 21:16    Microbiology: Recent Results (from the past 240 hour(s))  Blood culture (routine x 2)     Status: None (Preliminary result)   Collection Time: 08/30/15  9:05 PM  Result Value Ref Range Status   Specimen Description BLOOD RIGHT FOREARM  Final   Special Requests BOTTLES DRAWN AEROBIC AND ANAEROBIC  Final   Culture NO GROWTH 4 DAYS  Final   Report Status PENDING   Incomplete  Blood culture (routine x 2)     Status: None (Preliminary result)   Collection Time: 08/30/15  9:40 PM  Result Value Ref Range Status   Specimen Description BLOOD RIGHT ARM  Final   Special Requests BOTTLES DRAWN AEROBIC AND ANAEROBIC  Final   Culture NO GROWTH 4 DAYS  Final   Report Status PENDING  Incomplete  MRSA PCR Screening     Status: None   Collection Time: 09/01/15  6:01 PM  Result Value Ref Range Status   MRSA by PCR NEGATIVE NEGATIVE Final    Comment:        The GeneXpert MRSA Assay (FDA approved for NASAL specimens only), is one component of a comprehensive MRSA colonization surveillance program. It is not intended to diagnose MRSA infection nor to guide or monitor treatment for MRSA infections.   Culture, sputum-assessment     Status: None   Collection Time: 09/02/15  1:20 PM  Result Value Ref Range Status   Specimen Description SPUTUM  Final   Special Requests NONE  Final   Sputum evaluation   Final    MICROSCOPIC FINDINGS SUGGEST THAT THIS SPECIMEN IS NOT REPRESENTATIVE OF LOWER RESPIRATORY SECRETIONS. PLEASE RECOLLECT. Gram Stain Report Called to,Read Back By and Verified With: D JOHNSON,RN AT 1511 09/02/15 BY L BENFIELD    Report Status 09/02/2015 FINAL  Final  Culture, expectorated sputum-assessment     Status: None   Collection Time: 09/04/15 11:29 AM  Result Value Ref Range Status   Specimen Description SPUTUM  Final   Special Requests NONE  Final   Sputum evaluation   Final    THIS SPECIMEN  IS ACCEPTABLE. RESPIRATORY CULTURE REPORT TO FOLLOW.   Report Status 09/04/2015 FINAL  Final  Culture, respiratory (NON-Expectorated)     Status: None (Preliminary result)   Collection Time: 09/04/15 11:29 AM  Result Value Ref Range Status   Specimen Description SPUTUM  Final   Special Requests NONE  Final   Gram Stain   Final    MODERATE WBC PRESENT,BOTH PMN AND MONONUCLEAR MODERATE SQUAMOUS EPITHELIAL CELLS PRESENT FEW GRAM POSITIVE COCCI IN  PAIRS FEW GRAM NEGATIVE RODS RARE YEAST Performed at [facility]    Culture PENDING  Incomplete   Report Status PENDING  Incomplete     Labs: Basic Metabolic Panel:  Recent Labs Lab 09/01/15 0531 09/02/15 0600 09/03/15 0644 09/04/15 0520 09/05/15 0908  NA 137 142 142 144 143  K 3.2* 3.5 3.5 3.8 3.5  CL 99* 102 101 103 101  CO2 32  GLUCOSE 90 91 92 101* 131*  BUN 22* 20 21* 24* 27*  CREATININE 1.10* 1.18* 1.18* 1.21* 1.27*  CALCIUM 8.1* 8.4* 8.6* 9.0 9.6  MG  --  2.0  --   --   --    Liver Function Tests:  Recent Labs Lab 08/31/15 0404 09/01/15 0531  AST 27 32  ALT 16 16  ALKPHOS 50 44  BILITOT 0.8 1.0  PROT 5.5* 5.5*  ALBUMIN 2.7* 2.6*   No results for input(s): LIPASE, AMYLASE in the last 168 hours. No results for input(s): AMMONIA in the last 168 hours. CBC:  Recent Labs Lab 08/30/15 2104 08/31/15 0404 09/01/15 0531 09/04/15 0520 09/05/15 0908  WBC 8.3 7.3 5.9 5.0 5.3  NEUTROABS 6.5 5.7  --   --   --   HGB 10.9* 10.5* 10.1* 10.9* 12.8  HCT 34.7* 34.1* 31.4* 35.0* 39.6  MCV 86.3 85.9 86.7 85.2 85.3  PLT 187 170 163 203 255   Cardiac Enzymes:  Recent Labs Lab 08/30/15 2104 08/31/15 0810 08/31/15 1620  TROPONINI 0.12* 0.11* 0.08*   BNP: BNP (last 3 results)  Recent Labs  08/30/15 2104  BNP 1082.1*    ProBNP (last 3 results) No results for input(s): PROBNP in the last 8760 hours.  CBG: No results for input(s): GLUCAP in the last 168 hours.     SignedAlbertine Grates MD, PhD  [facility] 09/05/2015, 2:49 PM

## 2015-09-05 NOTE — Progress Notes (Signed)
Physical Therapy Treatment Patient Details Name: MACKENIZE DELGADILLO MRN: [id number] DOB: 06-03-1921 Today's Date: 2015/09/17    History of Present Illness 82 yo admitted with respiratory failure secondary to CHF and PNA. PMHx: AFib, CHF, pacemaker, HTN, CVA    PT Comments    Pt doing well with ambulation. Recommend rollator for home.  Follow Up Recommendations  Home health PT;Supervision - Intermittent     Equipment Recommendations  Other (comment) (rollator)    Recommendations for Other Services       Precautions / Restrictions Precautions Precautions: Fall Restrictions Weight Bearing Restrictions: No    Mobility  Bed Mobility Overal bed mobility: Modified Independent                Transfers Overall transfer level: Modified independent                  Ambulation/Gait Ambulation/Gait assistance: Modified independent (Device/Increase time) Ambulation Distance (Feet): 350 Feet Assistive device: Rolling walker (2 wheeled);4-wheeled walker Gait Pattern/deviations: Step-through pattern;Decreased stride length     General Gait Details: Pt tried both rollator and rolling walker. Pt steady with each. In room pt amb without device but using furniture for support.   Stairs            Wheelchair Mobility    Modified Rankin (Stroke Patients Only)       Balance Overall balance assessment: Needs assistance Sitting-balance support: No upper extremity supported;Feet supported Sitting balance-Leahy Scale: Normal     Standing balance support: No upper extremity supported;During functional activity Standing balance-Leahy Scale: Fair                      Cognition Arousal/Alertness: Awake/alert Behavior During Therapy: WFL for tasks assessed/performed Overall Cognitive Status: Within Functional Limits for tasks assessed                      Exercises      General Comments        Pertinent Vitals/Pain Pain Assessment:  No/denies pain    Home Living                      Prior Function            PT Goals (current goals can now be found in the care plan section) Progress towards PT goals: Progressing toward goals    Frequency  Min 3X/week    PT Plan Current plan remains appropriate    Co-evaluation             End of Session   Activity Tolerance: Patient tolerated treatment well Patient left: with call bell/phone within reach;in chair;with chair alarm set     Time: 8657-8469 PT Time Calculation (min) (ACUTE ONLY): 17 min  Charges:  $Gait Training: 8-22 mins                    G Codes:      Bryam Taborda 17-Sep-2015, 10:42 AM Skip Mayer PT [phone]

## 2015-09-05 NOTE — Progress Notes (Signed)
ANTICOAGULATION CONSULT NOTE - Follow Up Consult  Pharmacy Consult for Warfarin Indication: atrial fibrillation  Allergies  Allergen Reactions  . Prednisone Other (See Comments)    Long term has reaction; felt like passing out  . Sulfa Antibiotics Nausea And Vomiting  . Amoxicillin Rash    ONLY IN IV (PO OK)    Patient Measurements: Height:  (170.2 cm) Weight: 127 lb 3.3 oz (57.7 kg) IBW/kg (Calculated) : 61.6  Vital Signs: Temp: 98.2 F (36.8 C) (02/28 0607) Temp Source: Oral (02/28 0607) BP: 168/60 mmHg (02/28 0607) Pulse Rate: 56 (02/28 0607)  Labs:  Recent Labs  09/03/15 0644 09/04/15 0520 09/05/15 0908  HGB  --  10.9* 12.8  HCT  --  35.0* 39.6  PLT  --  203 255  LABPROT 24.8* 24.2* 25.2*  INR 2.26* 2.19* 2.31*  CREATININE 1.18* 1.21* 1.27*    Estimated Creatinine Clearance: 25.7 mL/min (by C-G formula based on Cr of 1.27).   Assessment: 31 YOF who presented on 2/23 with SOB and generalized body aches - started on Rocephin + Azithromycin for CAP >> now transitioned to Doxycycline, and Tamiflu for influenza +.  The patient was on warfarin PTA for hx Afib - pharmacy was consulted to resume dosing this admission.   INR therapeutic  PTA dose was 5 mg daily EXCEPT for 7.5 mg on MWF  Goal of Therapy:  INR 2-3   Plan:  Resume home dose Follow up AM INR if not discharged today  Thank you Okey Regal, PharmD [phone] 09/05/2015 11:12 AM

## 2015-09-05 NOTE — Progress Notes (Signed)
Susan [name] to be D/C'd Home per MD order.  Discussed with the patient and all questions fully answered.  VSS, Skin clean, dry and intact without evidence of skin break down, no evidence of skin tears noted. IV catheter discontinued intact. Site without signs and symptoms of complications. Dressing and pressure applied.  An After Visit Summary was printed and given to the patient. Patient received prescription.  D/c education completed with patient/family including follow up instructions, medication list, d/c activities limitations if indicated, with other d/c instructions as indicated by MD - patient able to verbalize understanding, all questions fully answered.   Patient instructed to return to ED, call 911, or call MD for any changes in condition.   Patient escorted via WC, and D/C home via private auto.  [name] 09/05/2015 3:20 PM

## 2015-09-05 NOTE — Care Management Important Message (Signed)
Important Message  Patient Details  Name: UVA RUNKEL MRN: [id number] Date of Birth: 18-Jan-1921   Medicare Important Message Given:  Yes    [name], RN 09/05/2015, 1:28 PMImportant Message  Patient Details  Name: ELZIE SHEETS MRN: [id number] Date of Birth: [birth year]   Medicare Important Message Given:  Yes    [name], RN 09/05/2015, 1:28 PM

## 2015-09-07 LAB — CULTURE, RESPIRATORY W GRAM STAIN: Culture: NORMAL

## 2015-09-07 LAB — CULTURE, RESPIRATORY

## 2015-09-15 ENCOUNTER — Telehealth: Payer: Self-pay | Admitting: Internal Medicine

## 2015-09-15 NOTE — Telephone Encounter (Signed)
Noted I will look for remote transmission. If I don't have it by Monday I will call patient.

## 2015-09-15 NOTE — Telephone Encounter (Signed)
New message      Pt was due to have a remote transmission check on 08-30-15.  She was in the hosp for a week and just found her letter from us.  She will do a remote transmission today.

## 2015-09-18 DIAGNOSIS — I4891 Unspecified atrial fibrillation: Secondary | ICD-10-CM | POA: Diagnosis not present

## 2015-09-18 DIAGNOSIS — R05 Cough: Secondary | ICD-10-CM | POA: Diagnosis not present

## 2015-09-18 DIAGNOSIS — Z7901 Long term (current) use of anticoagulants: Secondary | ICD-10-CM | POA: Diagnosis not present

## 2015-09-18 DIAGNOSIS — J302 Other seasonal allergic rhinitis: Secondary | ICD-10-CM | POA: Diagnosis not present

## 2015-09-18 DIAGNOSIS — G309 Alzheimer's disease, unspecified: Secondary | ICD-10-CM | POA: Diagnosis not present

## 2015-09-18 DIAGNOSIS — I509 Heart failure, unspecified: Secondary | ICD-10-CM | POA: Diagnosis not present

## 2015-09-18 DIAGNOSIS — J189 Pneumonia, unspecified organism: Secondary | ICD-10-CM | POA: Diagnosis not present

## 2015-09-18 DIAGNOSIS — Z6821 Body mass index (BMI) 21.0-21.9, adult: Secondary | ICD-10-CM | POA: Diagnosis not present

## 2015-09-18 DIAGNOSIS — J90 Pleural effusion, not elsewhere classified: Secondary | ICD-10-CM | POA: Diagnosis not present

## 2015-09-18 DIAGNOSIS — N182 Chronic kidney disease, stage 2 (mild): Secondary | ICD-10-CM | POA: Diagnosis not present

## 2015-09-18 DIAGNOSIS — J45909 Unspecified asthma, uncomplicated: Secondary | ICD-10-CM | POA: Diagnosis not present

## 2015-09-18 DIAGNOSIS — I10 Essential (primary) hypertension: Secondary | ICD-10-CM | POA: Diagnosis not present

## 2015-09-26 LAB — CUP PACEART REMOTE DEVICE CHECK
Battery Remaining Percentage: 91 %
Date Time Interrogation Session: 20170222074211
Implantable Lead Implant Date: 20120529
Implantable Lead Location: 753860
Lead Channel Sensing Intrinsic Amplitude: 12 mV
Lead Channel Setting Pacing Amplitude: 5 V
MDC IDC LEAD MODEL: 1948
MDC IDC MSMT BATTERY REMAINING LONGEVITY: 100 mo
MDC IDC MSMT BATTERY VOLTAGE: 2.95 V
MDC IDC MSMT LEADCHNL RV IMPEDANCE VALUE: 540 Ohm
MDC IDC PG SERIAL: 7222573
MDC IDC SET LEADCHNL RV PACING PULSEWIDTH: 0.4 ms
MDC IDC SET LEADCHNL RV SENSING SENSITIVITY: 2 mV
MDC IDC STAT BRADY RV PERCENT PACED: 68 %
Pulse Gen Model: 1210

## 2015-09-27 ENCOUNTER — Encounter: Payer: Self-pay | Admitting: Cardiology

## 2015-10-09 DIAGNOSIS — R479 Unspecified speech disturbances: Secondary | ICD-10-CM | POA: Diagnosis not present

## 2015-10-09 DIAGNOSIS — I4891 Unspecified atrial fibrillation: Secondary | ICD-10-CM | POA: Diagnosis not present

## 2015-10-09 DIAGNOSIS — Z950 Presence of cardiac pacemaker: Secondary | ICD-10-CM | POA: Diagnosis not present

## 2015-10-09 DIAGNOSIS — R51 Headache: Secondary | ICD-10-CM | POA: Diagnosis not present

## 2015-10-09 DIAGNOSIS — G255 Other chorea: Secondary | ICD-10-CM | POA: Diagnosis not present

## 2015-10-09 DIAGNOSIS — Z6821 Body mass index (BMI) 21.0-21.9, adult: Secondary | ICD-10-CM | POA: Diagnosis not present

## 2015-10-09 DIAGNOSIS — Z1389 Encounter for screening for other disorder: Secondary | ICD-10-CM | POA: Diagnosis not present

## 2015-10-09 DIAGNOSIS — Z7901 Long term (current) use of anticoagulants: Secondary | ICD-10-CM | POA: Diagnosis not present

## 2015-10-09 DIAGNOSIS — I10 Essential (primary) hypertension: Secondary | ICD-10-CM | POA: Diagnosis not present

## 2015-10-09 DIAGNOSIS — I509 Heart failure, unspecified: Secondary | ICD-10-CM | POA: Diagnosis not present

## 2015-10-09 LAB — PROTIME-INR: INR: 3.1 — AB (ref 0.9–1.1)

## 2015-10-11 DIAGNOSIS — K219 Gastro-esophageal reflux disease without esophagitis: Secondary | ICD-10-CM | POA: Diagnosis not present

## 2015-10-11 DIAGNOSIS — J301 Allergic rhinitis due to pollen: Secondary | ICD-10-CM | POA: Diagnosis not present

## 2015-10-11 DIAGNOSIS — I679 Cerebrovascular disease, unspecified: Secondary | ICD-10-CM | POA: Diagnosis not present

## 2015-10-11 DIAGNOSIS — I509 Heart failure, unspecified: Secondary | ICD-10-CM | POA: Diagnosis not present

## 2015-10-11 DIAGNOSIS — I4891 Unspecified atrial fibrillation: Secondary | ICD-10-CM | POA: Diagnosis not present

## 2015-10-11 DIAGNOSIS — G309 Alzheimer's disease, unspecified: Secondary | ICD-10-CM | POA: Diagnosis not present

## 2015-10-12 DIAGNOSIS — I509 Heart failure, unspecified: Secondary | ICD-10-CM | POA: Diagnosis not present

## 2015-10-12 DIAGNOSIS — J301 Allergic rhinitis due to pollen: Secondary | ICD-10-CM | POA: Diagnosis not present

## 2015-10-12 DIAGNOSIS — G309 Alzheimer's disease, unspecified: Secondary | ICD-10-CM | POA: Diagnosis not present

## 2015-10-12 DIAGNOSIS — I679 Cerebrovascular disease, unspecified: Secondary | ICD-10-CM | POA: Diagnosis not present

## 2015-10-12 DIAGNOSIS — I4891 Unspecified atrial fibrillation: Secondary | ICD-10-CM | POA: Diagnosis not present

## 2015-10-12 DIAGNOSIS — K219 Gastro-esophageal reflux disease without esophagitis: Secondary | ICD-10-CM | POA: Diagnosis not present

## 2015-10-13 DIAGNOSIS — G309 Alzheimer's disease, unspecified: Secondary | ICD-10-CM | POA: Diagnosis not present

## 2015-10-13 DIAGNOSIS — I4891 Unspecified atrial fibrillation: Secondary | ICD-10-CM | POA: Diagnosis not present

## 2015-10-13 DIAGNOSIS — I509 Heart failure, unspecified: Secondary | ICD-10-CM | POA: Diagnosis not present

## 2015-10-13 DIAGNOSIS — I679 Cerebrovascular disease, unspecified: Secondary | ICD-10-CM | POA: Diagnosis not present

## 2015-10-13 DIAGNOSIS — J301 Allergic rhinitis due to pollen: Secondary | ICD-10-CM | POA: Diagnosis not present

## 2015-10-13 DIAGNOSIS — K219 Gastro-esophageal reflux disease without esophagitis: Secondary | ICD-10-CM | POA: Diagnosis not present

## 2015-10-16 DIAGNOSIS — G309 Alzheimer's disease, unspecified: Secondary | ICD-10-CM | POA: Diagnosis not present

## 2015-10-16 DIAGNOSIS — I679 Cerebrovascular disease, unspecified: Secondary | ICD-10-CM | POA: Diagnosis not present

## 2015-10-16 DIAGNOSIS — J301 Allergic rhinitis due to pollen: Secondary | ICD-10-CM | POA: Diagnosis not present

## 2015-10-16 DIAGNOSIS — K219 Gastro-esophageal reflux disease without esophagitis: Secondary | ICD-10-CM | POA: Diagnosis not present

## 2015-10-16 DIAGNOSIS — I509 Heart failure, unspecified: Secondary | ICD-10-CM | POA: Diagnosis not present

## 2015-10-16 DIAGNOSIS — I4891 Unspecified atrial fibrillation: Secondary | ICD-10-CM | POA: Diagnosis not present

## 2015-10-18 DIAGNOSIS — G255 Other chorea: Secondary | ICD-10-CM | POA: Diagnosis not present

## 2015-10-18 DIAGNOSIS — K219 Gastro-esophageal reflux disease without esophagitis: Secondary | ICD-10-CM | POA: Diagnosis not present

## 2015-10-18 DIAGNOSIS — I679 Cerebrovascular disease, unspecified: Secondary | ICD-10-CM | POA: Diagnosis not present

## 2015-10-18 DIAGNOSIS — M2011 Hallux valgus (acquired), right foot: Secondary | ICD-10-CM | POA: Diagnosis not present

## 2015-10-18 DIAGNOSIS — I4891 Unspecified atrial fibrillation: Secondary | ICD-10-CM | POA: Diagnosis not present

## 2015-10-18 DIAGNOSIS — M19071 Primary osteoarthritis, right ankle and foot: Secondary | ICD-10-CM | POA: Diagnosis not present

## 2015-10-18 DIAGNOSIS — M25571 Pain in right ankle and joints of right foot: Secondary | ICD-10-CM | POA: Diagnosis not present

## 2015-10-18 DIAGNOSIS — J301 Allergic rhinitis due to pollen: Secondary | ICD-10-CM | POA: Diagnosis not present

## 2015-10-18 DIAGNOSIS — G309 Alzheimer's disease, unspecified: Secondary | ICD-10-CM | POA: Diagnosis not present

## 2015-10-18 DIAGNOSIS — R600 Localized edema: Secondary | ICD-10-CM | POA: Diagnosis not present

## 2015-10-18 DIAGNOSIS — I509 Heart failure, unspecified: Secondary | ICD-10-CM | POA: Diagnosis not present

## 2015-10-23 DIAGNOSIS — K219 Gastro-esophageal reflux disease without esophagitis: Secondary | ICD-10-CM | POA: Diagnosis not present

## 2015-10-23 DIAGNOSIS — J301 Allergic rhinitis due to pollen: Secondary | ICD-10-CM | POA: Diagnosis not present

## 2015-10-23 DIAGNOSIS — I4891 Unspecified atrial fibrillation: Secondary | ICD-10-CM | POA: Diagnosis not present

## 2015-10-23 DIAGNOSIS — I509 Heart failure, unspecified: Secondary | ICD-10-CM | POA: Diagnosis not present

## 2015-10-23 DIAGNOSIS — I679 Cerebrovascular disease, unspecified: Secondary | ICD-10-CM | POA: Diagnosis not present

## 2015-10-23 DIAGNOSIS — G309 Alzheimer's disease, unspecified: Secondary | ICD-10-CM | POA: Diagnosis not present

## 2015-10-25 DIAGNOSIS — J301 Allergic rhinitis due to pollen: Secondary | ICD-10-CM | POA: Diagnosis not present

## 2015-10-25 DIAGNOSIS — I679 Cerebrovascular disease, unspecified: Secondary | ICD-10-CM | POA: Diagnosis not present

## 2015-10-25 DIAGNOSIS — I509 Heart failure, unspecified: Secondary | ICD-10-CM | POA: Diagnosis not present

## 2015-10-25 DIAGNOSIS — G309 Alzheimer's disease, unspecified: Secondary | ICD-10-CM | POA: Diagnosis not present

## 2015-10-25 DIAGNOSIS — K219 Gastro-esophageal reflux disease without esophagitis: Secondary | ICD-10-CM | POA: Diagnosis not present

## 2015-10-25 DIAGNOSIS — I4891 Unspecified atrial fibrillation: Secondary | ICD-10-CM | POA: Diagnosis not present

## 2015-10-26 DIAGNOSIS — I4891 Unspecified atrial fibrillation: Secondary | ICD-10-CM | POA: Diagnosis not present

## 2015-10-26 DIAGNOSIS — I679 Cerebrovascular disease, unspecified: Secondary | ICD-10-CM | POA: Diagnosis not present

## 2015-10-26 DIAGNOSIS — J301 Allergic rhinitis due to pollen: Secondary | ICD-10-CM | POA: Diagnosis not present

## 2015-10-26 DIAGNOSIS — I509 Heart failure, unspecified: Secondary | ICD-10-CM | POA: Diagnosis not present

## 2015-10-26 DIAGNOSIS — G309 Alzheimer's disease, unspecified: Secondary | ICD-10-CM | POA: Diagnosis not present

## 2015-10-26 DIAGNOSIS — K219 Gastro-esophageal reflux disease without esophagitis: Secondary | ICD-10-CM | POA: Diagnosis not present

## 2015-10-27 DIAGNOSIS — I509 Heart failure, unspecified: Secondary | ICD-10-CM | POA: Diagnosis not present

## 2015-10-27 DIAGNOSIS — K219 Gastro-esophageal reflux disease without esophagitis: Secondary | ICD-10-CM | POA: Diagnosis not present

## 2015-10-27 DIAGNOSIS — I4891 Unspecified atrial fibrillation: Secondary | ICD-10-CM | POA: Diagnosis not present

## 2015-10-27 DIAGNOSIS — G309 Alzheimer's disease, unspecified: Secondary | ICD-10-CM | POA: Diagnosis not present

## 2015-10-27 DIAGNOSIS — J301 Allergic rhinitis due to pollen: Secondary | ICD-10-CM | POA: Diagnosis not present

## 2015-10-27 DIAGNOSIS — I679 Cerebrovascular disease, unspecified: Secondary | ICD-10-CM | POA: Diagnosis not present

## 2015-10-30 DIAGNOSIS — K219 Gastro-esophageal reflux disease without esophagitis: Secondary | ICD-10-CM | POA: Diagnosis not present

## 2015-10-30 DIAGNOSIS — G309 Alzheimer's disease, unspecified: Secondary | ICD-10-CM | POA: Diagnosis not present

## 2015-10-30 DIAGNOSIS — J301 Allergic rhinitis due to pollen: Secondary | ICD-10-CM | POA: Diagnosis not present

## 2015-10-30 DIAGNOSIS — I509 Heart failure, unspecified: Secondary | ICD-10-CM | POA: Diagnosis not present

## 2015-10-30 DIAGNOSIS — I4891 Unspecified atrial fibrillation: Secondary | ICD-10-CM | POA: Diagnosis not present

## 2015-10-30 DIAGNOSIS — I679 Cerebrovascular disease, unspecified: Secondary | ICD-10-CM | POA: Diagnosis not present

## 2015-11-01 DIAGNOSIS — I509 Heart failure, unspecified: Secondary | ICD-10-CM | POA: Diagnosis not present

## 2015-11-01 DIAGNOSIS — I679 Cerebrovascular disease, unspecified: Secondary | ICD-10-CM | POA: Diagnosis not present

## 2015-11-01 DIAGNOSIS — G309 Alzheimer's disease, unspecified: Secondary | ICD-10-CM | POA: Diagnosis not present

## 2015-11-01 DIAGNOSIS — J301 Allergic rhinitis due to pollen: Secondary | ICD-10-CM | POA: Diagnosis not present

## 2015-11-01 DIAGNOSIS — K219 Gastro-esophageal reflux disease without esophagitis: Secondary | ICD-10-CM | POA: Diagnosis not present

## 2015-11-01 DIAGNOSIS — I4891 Unspecified atrial fibrillation: Secondary | ICD-10-CM | POA: Diagnosis not present

## 2015-11-03 DIAGNOSIS — J301 Allergic rhinitis due to pollen: Secondary | ICD-10-CM | POA: Diagnosis not present

## 2015-11-03 DIAGNOSIS — I679 Cerebrovascular disease, unspecified: Secondary | ICD-10-CM | POA: Diagnosis not present

## 2015-11-03 DIAGNOSIS — I509 Heart failure, unspecified: Secondary | ICD-10-CM | POA: Diagnosis not present

## 2015-11-03 DIAGNOSIS — G309 Alzheimer's disease, unspecified: Secondary | ICD-10-CM | POA: Diagnosis not present

## 2015-11-03 DIAGNOSIS — K219 Gastro-esophageal reflux disease without esophagitis: Secondary | ICD-10-CM | POA: Diagnosis not present

## 2015-11-03 DIAGNOSIS — I4891 Unspecified atrial fibrillation: Secondary | ICD-10-CM | POA: Diagnosis not present

## 2015-11-06 DIAGNOSIS — I679 Cerebrovascular disease, unspecified: Secondary | ICD-10-CM | POA: Diagnosis not present

## 2015-11-06 DIAGNOSIS — J301 Allergic rhinitis due to pollen: Secondary | ICD-10-CM | POA: Diagnosis not present

## 2015-11-06 DIAGNOSIS — G309 Alzheimer's disease, unspecified: Secondary | ICD-10-CM | POA: Diagnosis not present

## 2015-11-06 DIAGNOSIS — K219 Gastro-esophageal reflux disease without esophagitis: Secondary | ICD-10-CM | POA: Diagnosis not present

## 2015-11-06 DIAGNOSIS — I4891 Unspecified atrial fibrillation: Secondary | ICD-10-CM | POA: Diagnosis not present

## 2015-11-06 DIAGNOSIS — I509 Heart failure, unspecified: Secondary | ICD-10-CM | POA: Diagnosis not present

## 2015-11-08 DIAGNOSIS — I509 Heart failure, unspecified: Secondary | ICD-10-CM | POA: Diagnosis not present

## 2015-11-08 DIAGNOSIS — I4891 Unspecified atrial fibrillation: Secondary | ICD-10-CM | POA: Diagnosis not present

## 2015-11-08 DIAGNOSIS — J301 Allergic rhinitis due to pollen: Secondary | ICD-10-CM | POA: Diagnosis not present

## 2015-11-08 DIAGNOSIS — I679 Cerebrovascular disease, unspecified: Secondary | ICD-10-CM | POA: Diagnosis not present

## 2015-11-08 DIAGNOSIS — G309 Alzheimer's disease, unspecified: Secondary | ICD-10-CM | POA: Diagnosis not present

## 2015-11-08 DIAGNOSIS — K219 Gastro-esophageal reflux disease without esophagitis: Secondary | ICD-10-CM | POA: Diagnosis not present

## 2015-11-09 DIAGNOSIS — I679 Cerebrovascular disease, unspecified: Secondary | ICD-10-CM | POA: Diagnosis not present

## 2015-11-09 DIAGNOSIS — G309 Alzheimer's disease, unspecified: Secondary | ICD-10-CM | POA: Diagnosis not present

## 2015-11-09 DIAGNOSIS — K219 Gastro-esophageal reflux disease without esophagitis: Secondary | ICD-10-CM | POA: Diagnosis not present

## 2015-11-09 DIAGNOSIS — J301 Allergic rhinitis due to pollen: Secondary | ICD-10-CM | POA: Diagnosis not present

## 2015-11-09 DIAGNOSIS — I4891 Unspecified atrial fibrillation: Secondary | ICD-10-CM | POA: Diagnosis not present

## 2015-11-09 DIAGNOSIS — I509 Heart failure, unspecified: Secondary | ICD-10-CM | POA: Diagnosis not present

## 2015-11-10 DIAGNOSIS — G309 Alzheimer's disease, unspecified: Secondary | ICD-10-CM | POA: Diagnosis not present

## 2015-11-10 DIAGNOSIS — J301 Allergic rhinitis due to pollen: Secondary | ICD-10-CM | POA: Diagnosis not present

## 2015-11-10 DIAGNOSIS — I4891 Unspecified atrial fibrillation: Secondary | ICD-10-CM | POA: Diagnosis not present

## 2015-11-10 DIAGNOSIS — I679 Cerebrovascular disease, unspecified: Secondary | ICD-10-CM | POA: Diagnosis not present

## 2015-11-10 DIAGNOSIS — I509 Heart failure, unspecified: Secondary | ICD-10-CM | POA: Diagnosis not present

## 2015-11-10 DIAGNOSIS — K219 Gastro-esophageal reflux disease without esophagitis: Secondary | ICD-10-CM | POA: Diagnosis not present

## 2015-11-13 DIAGNOSIS — G309 Alzheimer's disease, unspecified: Secondary | ICD-10-CM | POA: Diagnosis not present

## 2015-11-13 DIAGNOSIS — I679 Cerebrovascular disease, unspecified: Secondary | ICD-10-CM | POA: Diagnosis not present

## 2015-11-13 DIAGNOSIS — I509 Heart failure, unspecified: Secondary | ICD-10-CM | POA: Diagnosis not present

## 2015-11-13 DIAGNOSIS — K219 Gastro-esophageal reflux disease without esophagitis: Secondary | ICD-10-CM | POA: Diagnosis not present

## 2015-11-13 DIAGNOSIS — I4891 Unspecified atrial fibrillation: Secondary | ICD-10-CM | POA: Diagnosis not present

## 2015-11-13 DIAGNOSIS — J301 Allergic rhinitis due to pollen: Secondary | ICD-10-CM | POA: Diagnosis not present

## 2015-11-15 DIAGNOSIS — I509 Heart failure, unspecified: Secondary | ICD-10-CM | POA: Diagnosis not present

## 2015-11-15 DIAGNOSIS — I679 Cerebrovascular disease, unspecified: Secondary | ICD-10-CM | POA: Diagnosis not present

## 2015-11-15 DIAGNOSIS — I4891 Unspecified atrial fibrillation: Secondary | ICD-10-CM | POA: Diagnosis not present

## 2015-11-15 DIAGNOSIS — J301 Allergic rhinitis due to pollen: Secondary | ICD-10-CM | POA: Diagnosis not present

## 2015-11-15 DIAGNOSIS — K219 Gastro-esophageal reflux disease without esophagitis: Secondary | ICD-10-CM | POA: Diagnosis not present

## 2015-11-15 DIAGNOSIS — G309 Alzheimer's disease, unspecified: Secondary | ICD-10-CM | POA: Diagnosis not present

## 2015-11-17 DIAGNOSIS — J301 Allergic rhinitis due to pollen: Secondary | ICD-10-CM | POA: Diagnosis not present

## 2015-11-17 DIAGNOSIS — I4891 Unspecified atrial fibrillation: Secondary | ICD-10-CM | POA: Diagnosis not present

## 2015-11-17 DIAGNOSIS — I679 Cerebrovascular disease, unspecified: Secondary | ICD-10-CM | POA: Diagnosis not present

## 2015-11-17 DIAGNOSIS — K219 Gastro-esophageal reflux disease without esophagitis: Secondary | ICD-10-CM | POA: Diagnosis not present

## 2015-11-17 DIAGNOSIS — I509 Heart failure, unspecified: Secondary | ICD-10-CM | POA: Diagnosis not present

## 2015-11-17 DIAGNOSIS — G309 Alzheimer's disease, unspecified: Secondary | ICD-10-CM | POA: Diagnosis not present

## 2015-11-20 DIAGNOSIS — I509 Heart failure, unspecified: Secondary | ICD-10-CM | POA: Diagnosis not present

## 2015-11-20 DIAGNOSIS — G309 Alzheimer's disease, unspecified: Secondary | ICD-10-CM | POA: Diagnosis not present

## 2015-11-20 DIAGNOSIS — I679 Cerebrovascular disease, unspecified: Secondary | ICD-10-CM | POA: Diagnosis not present

## 2015-11-20 DIAGNOSIS — I4891 Unspecified atrial fibrillation: Secondary | ICD-10-CM | POA: Diagnosis not present

## 2015-11-20 DIAGNOSIS — K219 Gastro-esophageal reflux disease without esophagitis: Secondary | ICD-10-CM | POA: Diagnosis not present

## 2015-11-20 DIAGNOSIS — J301 Allergic rhinitis due to pollen: Secondary | ICD-10-CM | POA: Diagnosis not present

## 2015-11-22 DIAGNOSIS — K219 Gastro-esophageal reflux disease without esophagitis: Secondary | ICD-10-CM | POA: Diagnosis not present

## 2015-11-22 DIAGNOSIS — I679 Cerebrovascular disease, unspecified: Secondary | ICD-10-CM | POA: Diagnosis not present

## 2015-11-22 DIAGNOSIS — G309 Alzheimer's disease, unspecified: Secondary | ICD-10-CM | POA: Diagnosis not present

## 2015-11-22 DIAGNOSIS — I509 Heart failure, unspecified: Secondary | ICD-10-CM | POA: Diagnosis not present

## 2015-11-22 DIAGNOSIS — I4891 Unspecified atrial fibrillation: Secondary | ICD-10-CM | POA: Diagnosis not present

## 2015-11-22 DIAGNOSIS — J301 Allergic rhinitis due to pollen: Secondary | ICD-10-CM | POA: Diagnosis not present

## 2015-11-23 DIAGNOSIS — K219 Gastro-esophageal reflux disease without esophagitis: Secondary | ICD-10-CM | POA: Diagnosis not present

## 2015-11-23 DIAGNOSIS — I4891 Unspecified atrial fibrillation: Secondary | ICD-10-CM | POA: Diagnosis not present

## 2015-11-23 DIAGNOSIS — J301 Allergic rhinitis due to pollen: Secondary | ICD-10-CM | POA: Diagnosis not present

## 2015-11-23 DIAGNOSIS — I509 Heart failure, unspecified: Secondary | ICD-10-CM | POA: Diagnosis not present

## 2015-11-23 DIAGNOSIS — I679 Cerebrovascular disease, unspecified: Secondary | ICD-10-CM | POA: Diagnosis not present

## 2015-11-23 DIAGNOSIS — G309 Alzheimer's disease, unspecified: Secondary | ICD-10-CM | POA: Diagnosis not present

## 2015-11-24 DIAGNOSIS — K219 Gastro-esophageal reflux disease without esophagitis: Secondary | ICD-10-CM | POA: Diagnosis not present

## 2015-11-24 DIAGNOSIS — J301 Allergic rhinitis due to pollen: Secondary | ICD-10-CM | POA: Diagnosis not present

## 2015-11-24 DIAGNOSIS — I509 Heart failure, unspecified: Secondary | ICD-10-CM | POA: Diagnosis not present

## 2015-11-24 DIAGNOSIS — I679 Cerebrovascular disease, unspecified: Secondary | ICD-10-CM | POA: Diagnosis not present

## 2015-11-24 DIAGNOSIS — I4891 Unspecified atrial fibrillation: Secondary | ICD-10-CM | POA: Diagnosis not present

## 2015-11-24 DIAGNOSIS — G309 Alzheimer's disease, unspecified: Secondary | ICD-10-CM | POA: Diagnosis not present

## 2015-11-27 DIAGNOSIS — J301 Allergic rhinitis due to pollen: Secondary | ICD-10-CM | POA: Diagnosis not present

## 2015-11-27 DIAGNOSIS — I4891 Unspecified atrial fibrillation: Secondary | ICD-10-CM | POA: Diagnosis not present

## 2015-11-27 DIAGNOSIS — I679 Cerebrovascular disease, unspecified: Secondary | ICD-10-CM | POA: Diagnosis not present

## 2015-11-27 DIAGNOSIS — G309 Alzheimer's disease, unspecified: Secondary | ICD-10-CM | POA: Diagnosis not present

## 2015-11-27 DIAGNOSIS — I509 Heart failure, unspecified: Secondary | ICD-10-CM | POA: Diagnosis not present

## 2015-11-27 DIAGNOSIS — K219 Gastro-esophageal reflux disease without esophagitis: Secondary | ICD-10-CM | POA: Diagnosis not present

## 2015-11-29 DIAGNOSIS — I4891 Unspecified atrial fibrillation: Secondary | ICD-10-CM | POA: Diagnosis not present

## 2015-11-29 DIAGNOSIS — G309 Alzheimer's disease, unspecified: Secondary | ICD-10-CM | POA: Diagnosis not present

## 2015-11-29 DIAGNOSIS — I679 Cerebrovascular disease, unspecified: Secondary | ICD-10-CM | POA: Diagnosis not present

## 2015-11-29 DIAGNOSIS — I509 Heart failure, unspecified: Secondary | ICD-10-CM | POA: Diagnosis not present

## 2015-11-29 DIAGNOSIS — K219 Gastro-esophageal reflux disease without esophagitis: Secondary | ICD-10-CM | POA: Diagnosis not present

## 2015-11-29 DIAGNOSIS — J301 Allergic rhinitis due to pollen: Secondary | ICD-10-CM | POA: Diagnosis not present

## 2015-11-29 NOTE — Progress Notes (Signed)
Electrophysiology Office Note Date: 11/30/2015  ID:  Susan [name], DOB 1921-06-09, MRN [id number]  PCP: Hoyle Sauer, MD Primary Cardiologist: [place] Electrophysiologist: Allred  CC: Pacemaker follow-up  Susan [name] is a 82 y.o. female seen today for Dr Johney Frame.  She presents today for routine electrophysiology followup.  Since last being seen in our clinic, the patient reports doing reasonably well.  She had a stroke in march of this year on therapeutic Coumadin but has recovered.  She denies chest pain, palpitations, dyspnea, PND, orthopnea, nausea, vomiting, dizziness, syncope, edema, weight gain, or early satiety.  Device History: STJ single chamber PPM implanted 2012 for tachy brady    Past Medical History  Diagnosis Date  . Hypertension   . Asthma   . Dementia     mild  . Osteoarthritis   . Gallstones   . Chronic kidney disease     Stage 2 to 3  . Anemia   . Stroke [facility])     Remote R cerebellar stroke with gait abnormaility  . Atrial fibrillation (HCC)     permanent  . Coronary artery disease   . Anticoagulant long-term use   . Chronic diastolic CHF (congestive heart failure) (HCC)   . Valvular heart disease     Mitral and tricuspid insufficiency  . Tachy-brady syndrome (HCC)   . Pacemaker May 2012    VVI per Dr. Johney Frame  . Flu 08/2015  . GERD (gastroesophageal reflux disease)    Past Surgical History  Procedure Laterality Date  . Abdominal hysterectomy      total  . Appendectomy    . Hemorrhoid surgery    . Cataract extraction, bilateral    . Total knee arthroplasty    . Cholecystectomy    . Pacemaker insertion  May 2012    St Jude Accent SR RF implant. Dr. Johney Frame    Current Outpatient Prescriptions  Medication Sig Dispense Refill  . amLODipine (NORVASC) 2.5 MG tablet Take 1 tablet (2.5 mg total) by mouth daily. 90 tablet 3  . benzonatate (TESSALON) 100 MG capsule Take 1 capsule (100 mg total) by mouth 3 (three) times daily as needed for  cough. 20 capsule 0  . Calcium Carbonate (CALTRATE 600 PO) Take 1 capsule by mouth 2 (two) times daily.     . Cyanocobalamin (VITAMIN B-12 PO) Take 1 capsule by mouth daily as needed (takes occasionally).     Marland Kitchen digoxin (LANOXIN) 0.125 MG tablet Take 1 tablet (0.125 mg total) by mouth daily. 90 tablet 1  . docusate sodium (COLACE) 100 MG capsule Take 100 mg by mouth at bedtime.     . donepezil (ARICEPT) 10 MG tablet Take 10 mg by mouth at bedtime.      Marland Kitchen doxycycline (VIBRA-TABS) 100 MG tablet Take 1 tablet (100 mg total) by mouth 2 (two) times daily. 6 tablet 0  . fish oil-omega-3 fatty acids 1000 MG capsule Take 1 g by mouth daily.     . Fluticasone-Salmeterol (ADVAIR DISKUS) 250-50 MCG/DOSE AEPB Inhale 1 puff into the lungs every 12 (twelve) hours.      . furosemide (LASIX) 20 MG tablet Take 2 tablets (40 mg total) by mouth daily. Pt can take 1 extra tablet by mouth daily as needed for weight gain 30 tablet 0  . Garlic 100 MG TABS Take 1 tablet by mouth daily.     Marland Kitchen HYDROcodone-acetaminophen (NORCO) 10-325 MG per tablet Take 1 tablet by mouth every 6 (six) hours as needed (pain).     Marland Kitchen  metoprolol succinate (TOPROL-XL) 100 MG 24 hr tablet Take 1 tablet (100 mg total) by mouth daily. Take with or immediately following a meal. 90 tablet 2  . montelukast (SINGULAIR) 10 MG tablet Take 10 mg by mouth at bedtime as needed (for breathing).     . Multiple Vitamins-Minerals (OCUVITE ADULT 50+ PO) Take 1 capsule by mouth daily.     Marland Kitchen. NAMENDA XR 28 MG CP24 24 hr capsule Take 28 mg by mouth daily.  6  . Probiotic Product (ALIGN) 4 MG CAPS Take 4 mg by mouth daily.    Marland Kitchen. warfarin (COUMADIN) 5 MG tablet Take 5-7.5 mg by mouth daily at 6 PM. 7.5mg  on Mon, Wed and Fri  5mg  all other days    . QUEtiapine (SEROQUEL) 25 MG tablet Take 50 mg by mouth 2 (two) times daily.     . [DISCONTINUED] Dexlansoprazole (DEXILANT PO) Take by mouth as needed.       No current facility-administered medications for this visit.     Allergies:   Prednisone; Sulfa antibiotics; and Amoxicillin   Social History: Social History   Social History  . Marital Status: Widowed    Spouse Name: N/A  . Number of Children: N/A  . Years of Education: N/A   Occupational History  . Not on file.   Social History Main Topics  . Smoking status: Never Smoker   . Smokeless tobacco: Never Used  . Alcohol Use: No  . Drug Use: No  . Sexual Activity: Not on file   Other Topics Concern  . Not on file   Social History Narrative   [place] lives in [place] with daughter.  Attends [employer]   Retired from an [employer].    Family History: Family History  Problem Relation Age of Onset  . Clotting disorder Mother   . Suicidality Father      Review of Systems: All other systems reviewed and are otherwise negative except as noted above.   Physical Exam: VS:  BP 150/66 mmHg  Ht 5\' 7"  (1.702 m)  Wt 131 lb (59.421 kg)  BMI 20.51 kg/m2 , BMI Body mass index is 20.51 kg/(m^2).  GEN- The patient is elderly appearing, alert and oriented x 3 today.   HEENT: normocephalic, atraumatic; sclera clear, conjunctiva pink; hearing intact; oropharynx clear; neck supple  Lungs- Clear to ausculation bilaterally, normal work of breathing.  No wheezes, rales, rhonchi Heart- Irregular rate and rhythm  GI- soft, non-tender, non-distended, bowel sounds present  Extremities- no clubbing, cyanosis, or edema; DP/PT/radial pulses 2+ bilaterally MS- no significant deformity or atrophy Skin- warm and dry, no rash or lesion; PPM pocket well healed Psych- euthymic mood, full affect Neuro- strength and sensation are intact  PPM Interrogation- reviewed in detail today,  See PACEART report  EKG:  EKG is not ordered today.  Recent Labs: 08/30/2015: B Natriuretic Peptide 1082.1* 09/01/2015: ALT 16 09/02/2015: Magnesium 2.0 09/05/2015: BUN 27*; Creatinine, Ser 1.27*; Hemoglobin 12.8; Platelets 255; Potassium 3.5; Sodium 143    Wt Readings from Last 3 Encounters:  11/30/15 131 lb (59.421 kg)  09/05/15 127 lb 3.3 oz (57.7 kg)  05/30/15 141 lb 11.2 oz (64.275 kg)     Other studies Reviewed: Additional studies/ records that were reviewed today include: Dr Johney FrameAllred and Dr Elvis CoilJordan's office notes  Assessment and Plan:  1.  Tachy brady syndrome Normal PPM function See Pace Art report No changes today  2.  Permanent atrial fibrillation V rates controlled by device interrogation  today Continue Warfarin for CHADS2VASC of 6 No recent bleeding issues  CBC 08/2015 stable  3. HTN Stable No change required today    Current medicines are reviewed at length with the patient today.   The patient does not have concerns regarding her medicines.  The following changes were made today:  none  Labs/ tests ordered today include: none    Disposition:   Follow up with Merlin transmissions, Dr [place] as scheduled, me in 1 year    Signed, Gypsy Balsam, NP 11/30/2015 10:55 AM  [facility] [address] [place] [address] [phone] (office) [phone] (fax)

## 2015-11-30 ENCOUNTER — Encounter: Payer: Self-pay | Admitting: Nurse Practitioner

## 2015-11-30 ENCOUNTER — Ambulatory Visit (INDEPENDENT_AMBULATORY_CARE_PROVIDER_SITE_OTHER): Admitting: Nurse Practitioner

## 2015-11-30 ENCOUNTER — Encounter: Payer: Self-pay | Admitting: Internal Medicine

## 2015-11-30 ENCOUNTER — Other Ambulatory Visit: Payer: Self-pay

## 2015-11-30 VITALS — BP 150/66 | Ht 67.0 in | Wt 131.0 lb

## 2015-11-30 DIAGNOSIS — I482 Chronic atrial fibrillation: Secondary | ICD-10-CM | POA: Diagnosis not present

## 2015-11-30 DIAGNOSIS — K219 Gastro-esophageal reflux disease without esophagitis: Secondary | ICD-10-CM | POA: Diagnosis not present

## 2015-11-30 DIAGNOSIS — I10 Essential (primary) hypertension: Secondary | ICD-10-CM

## 2015-11-30 DIAGNOSIS — I679 Cerebrovascular disease, unspecified: Secondary | ICD-10-CM | POA: Diagnosis not present

## 2015-11-30 DIAGNOSIS — I4821 Permanent atrial fibrillation: Secondary | ICD-10-CM

## 2015-11-30 DIAGNOSIS — I4891 Unspecified atrial fibrillation: Secondary | ICD-10-CM | POA: Diagnosis not present

## 2015-11-30 DIAGNOSIS — I495 Sick sinus syndrome: Secondary | ICD-10-CM

## 2015-11-30 DIAGNOSIS — G309 Alzheimer's disease, unspecified: Secondary | ICD-10-CM | POA: Diagnosis not present

## 2015-11-30 DIAGNOSIS — I509 Heart failure, unspecified: Secondary | ICD-10-CM | POA: Diagnosis not present

## 2015-11-30 DIAGNOSIS — J301 Allergic rhinitis due to pollen: Secondary | ICD-10-CM | POA: Diagnosis not present

## 2015-11-30 LAB — CUP PACEART INCLINIC DEVICE CHECK
Battery Remaining Longevity: 145.2
Battery Voltage: 2.98 V
Brady Statistic RV Percent Paced: 69 %
Date Time Interrogation Session: 20170525110038
Implantable Lead Implant Date: 20120529
Implantable Lead Location: 753860
Implantable Lead Model: 1948
Lead Channel Impedance Value: 550 Ohm
Lead Channel Pacing Threshold Amplitude: 0.75 V
Lead Channel Pacing Threshold Pulse Width: 0.4 ms
Lead Channel Sensing Intrinsic Amplitude: 12 mV
Lead Channel Setting Pacing Amplitude: 2.5 V
Lead Channel Setting Pacing Pulse Width: 0.4 ms
Lead Channel Setting Sensing Sensitivity: 2 mV
Pulse Gen Model: 1210
Pulse Gen Serial Number: 7222573

## 2015-11-30 NOTE — Patient Instructions (Signed)
Medication Instructions:   Your physician recommends that you continue on your current medications as directed. Please refer to the Current Medication list given to you today.   If you need a refill on your cardiac medications before your next appointment, please call your pharmacy.  Labwork: NONE ORDER TODAY    Testing/Procedures: NONE ORDER TODAY    Follow-Up: Remote monitoring is used to monitor your Pacemaker of ICD from home. This monitoring reduces the number of office visits required to check your device to one time per year. It allows us to keep an eye on the functioning of your device to ensure it is working properly. You are scheduled for a device check from home on .02/29/16..You may send your transmission at any time that day. If you have a wireless device, the transmission will be sent automatically. After your physician reviews your transmission, you will receive a postcard with your next transmission date.  Your physician wants you to follow-up in: ONE YEAR WITH SEILER You will receive a reminder letter in the mail two months in advance. If you don't receive a letter, please call our office to schedule the follow-up appointment.     Any Other Special Instructions Will Be Listed Below (If Applicable).                                                                                                                                                   

## 2015-12-01 DIAGNOSIS — I679 Cerebrovascular disease, unspecified: Secondary | ICD-10-CM | POA: Diagnosis not present

## 2015-12-01 DIAGNOSIS — I4891 Unspecified atrial fibrillation: Secondary | ICD-10-CM | POA: Diagnosis not present

## 2015-12-01 DIAGNOSIS — G309 Alzheimer's disease, unspecified: Secondary | ICD-10-CM | POA: Diagnosis not present

## 2015-12-01 DIAGNOSIS — I509 Heart failure, unspecified: Secondary | ICD-10-CM | POA: Diagnosis not present

## 2015-12-01 DIAGNOSIS — K219 Gastro-esophageal reflux disease without esophagitis: Secondary | ICD-10-CM | POA: Diagnosis not present

## 2015-12-01 DIAGNOSIS — J301 Allergic rhinitis due to pollen: Secondary | ICD-10-CM | POA: Diagnosis not present

## 2015-12-06 DIAGNOSIS — I509 Heart failure, unspecified: Secondary | ICD-10-CM | POA: Diagnosis not present

## 2015-12-06 DIAGNOSIS — I679 Cerebrovascular disease, unspecified: Secondary | ICD-10-CM | POA: Diagnosis not present

## 2015-12-06 DIAGNOSIS — G309 Alzheimer's disease, unspecified: Secondary | ICD-10-CM | POA: Diagnosis not present

## 2015-12-06 DIAGNOSIS — J301 Allergic rhinitis due to pollen: Secondary | ICD-10-CM | POA: Diagnosis not present

## 2015-12-06 DIAGNOSIS — I4891 Unspecified atrial fibrillation: Secondary | ICD-10-CM | POA: Diagnosis not present

## 2015-12-06 DIAGNOSIS — K219 Gastro-esophageal reflux disease without esophagitis: Secondary | ICD-10-CM | POA: Diagnosis not present

## 2015-12-07 DIAGNOSIS — J301 Allergic rhinitis due to pollen: Secondary | ICD-10-CM | POA: Diagnosis not present

## 2015-12-07 DIAGNOSIS — I679 Cerebrovascular disease, unspecified: Secondary | ICD-10-CM | POA: Diagnosis not present

## 2015-12-07 DIAGNOSIS — K219 Gastro-esophageal reflux disease without esophagitis: Secondary | ICD-10-CM | POA: Diagnosis not present

## 2015-12-07 DIAGNOSIS — G309 Alzheimer's disease, unspecified: Secondary | ICD-10-CM | POA: Diagnosis not present

## 2015-12-07 DIAGNOSIS — I509 Heart failure, unspecified: Secondary | ICD-10-CM | POA: Diagnosis not present

## 2015-12-07 DIAGNOSIS — I4891 Unspecified atrial fibrillation: Secondary | ICD-10-CM | POA: Diagnosis not present

## 2015-12-11 DIAGNOSIS — I4891 Unspecified atrial fibrillation: Secondary | ICD-10-CM | POA: Diagnosis not present

## 2015-12-11 DIAGNOSIS — K219 Gastro-esophageal reflux disease without esophagitis: Secondary | ICD-10-CM | POA: Diagnosis not present

## 2015-12-11 DIAGNOSIS — I509 Heart failure, unspecified: Secondary | ICD-10-CM | POA: Diagnosis not present

## 2015-12-11 DIAGNOSIS — J301 Allergic rhinitis due to pollen: Secondary | ICD-10-CM | POA: Diagnosis not present

## 2015-12-11 DIAGNOSIS — I679 Cerebrovascular disease, unspecified: Secondary | ICD-10-CM | POA: Diagnosis not present

## 2015-12-11 DIAGNOSIS — G309 Alzheimer's disease, unspecified: Secondary | ICD-10-CM | POA: Diagnosis not present

## 2015-12-13 DIAGNOSIS — I4891 Unspecified atrial fibrillation: Secondary | ICD-10-CM | POA: Diagnosis not present

## 2015-12-13 DIAGNOSIS — G309 Alzheimer's disease, unspecified: Secondary | ICD-10-CM | POA: Diagnosis not present

## 2015-12-13 DIAGNOSIS — K219 Gastro-esophageal reflux disease without esophagitis: Secondary | ICD-10-CM | POA: Diagnosis not present

## 2015-12-13 DIAGNOSIS — I509 Heart failure, unspecified: Secondary | ICD-10-CM | POA: Diagnosis not present

## 2015-12-13 DIAGNOSIS — J301 Allergic rhinitis due to pollen: Secondary | ICD-10-CM | POA: Diagnosis not present

## 2015-12-13 DIAGNOSIS — I679 Cerebrovascular disease, unspecified: Secondary | ICD-10-CM | POA: Diagnosis not present

## 2015-12-15 DIAGNOSIS — I679 Cerebrovascular disease, unspecified: Secondary | ICD-10-CM | POA: Diagnosis not present

## 2015-12-15 DIAGNOSIS — I509 Heart failure, unspecified: Secondary | ICD-10-CM | POA: Diagnosis not present

## 2015-12-15 DIAGNOSIS — G309 Alzheimer's disease, unspecified: Secondary | ICD-10-CM | POA: Diagnosis not present

## 2015-12-15 DIAGNOSIS — I4891 Unspecified atrial fibrillation: Secondary | ICD-10-CM | POA: Diagnosis not present

## 2015-12-15 DIAGNOSIS — J301 Allergic rhinitis due to pollen: Secondary | ICD-10-CM | POA: Diagnosis not present

## 2015-12-15 DIAGNOSIS — K219 Gastro-esophageal reflux disease without esophagitis: Secondary | ICD-10-CM | POA: Diagnosis not present

## 2015-12-16 DIAGNOSIS — I4891 Unspecified atrial fibrillation: Secondary | ICD-10-CM | POA: Diagnosis not present

## 2015-12-16 DIAGNOSIS — I679 Cerebrovascular disease, unspecified: Secondary | ICD-10-CM | POA: Diagnosis not present

## 2015-12-16 DIAGNOSIS — G309 Alzheimer's disease, unspecified: Secondary | ICD-10-CM | POA: Diagnosis not present

## 2015-12-16 DIAGNOSIS — J301 Allergic rhinitis due to pollen: Secondary | ICD-10-CM | POA: Diagnosis not present

## 2015-12-16 DIAGNOSIS — K219 Gastro-esophageal reflux disease without esophagitis: Secondary | ICD-10-CM | POA: Diagnosis not present

## 2015-12-16 DIAGNOSIS — I509 Heart failure, unspecified: Secondary | ICD-10-CM | POA: Diagnosis not present

## 2015-12-18 DIAGNOSIS — I679 Cerebrovascular disease, unspecified: Secondary | ICD-10-CM | POA: Diagnosis not present

## 2015-12-18 DIAGNOSIS — I509 Heart failure, unspecified: Secondary | ICD-10-CM | POA: Diagnosis not present

## 2015-12-18 DIAGNOSIS — J301 Allergic rhinitis due to pollen: Secondary | ICD-10-CM | POA: Diagnosis not present

## 2015-12-18 DIAGNOSIS — G309 Alzheimer's disease, unspecified: Secondary | ICD-10-CM | POA: Diagnosis not present

## 2015-12-18 DIAGNOSIS — I4891 Unspecified atrial fibrillation: Secondary | ICD-10-CM | POA: Diagnosis not present

## 2015-12-18 DIAGNOSIS — K219 Gastro-esophageal reflux disease without esophagitis: Secondary | ICD-10-CM | POA: Diagnosis not present

## 2015-12-20 ENCOUNTER — Encounter: Payer: Self-pay | Admitting: Cardiology

## 2015-12-20 ENCOUNTER — Ambulatory Visit (INDEPENDENT_AMBULATORY_CARE_PROVIDER_SITE_OTHER): Admitting: Cardiology

## 2015-12-20 VITALS — BP 104/50 | HR 55 | Ht 67.0 in | Wt 125.0 lb

## 2015-12-20 DIAGNOSIS — G309 Alzheimer's disease, unspecified: Secondary | ICD-10-CM | POA: Diagnosis not present

## 2015-12-20 DIAGNOSIS — I480 Paroxysmal atrial fibrillation: Secondary | ICD-10-CM

## 2015-12-20 DIAGNOSIS — I4891 Unspecified atrial fibrillation: Secondary | ICD-10-CM | POA: Diagnosis not present

## 2015-12-20 DIAGNOSIS — I5032 Chronic diastolic (congestive) heart failure: Secondary | ICD-10-CM

## 2015-12-20 DIAGNOSIS — N183 Chronic kidney disease, stage 3 unspecified: Secondary | ICD-10-CM

## 2015-12-20 DIAGNOSIS — J301 Allergic rhinitis due to pollen: Secondary | ICD-10-CM | POA: Diagnosis not present

## 2015-12-20 DIAGNOSIS — I679 Cerebrovascular disease, unspecified: Secondary | ICD-10-CM | POA: Diagnosis not present

## 2015-12-20 DIAGNOSIS — K219 Gastro-esophageal reflux disease without esophagitis: Secondary | ICD-10-CM | POA: Diagnosis not present

## 2015-12-20 DIAGNOSIS — I509 Heart failure, unspecified: Secondary | ICD-10-CM | POA: Diagnosis not present

## 2015-12-20 NOTE — Progress Notes (Signed)
[name] Date of Birth: 13-Dec-1920   History of Present Illness: Susan [name] is seen back today for followup of afib. She has a history of permanent atrial fibrillation. She has a pacemaker in place.  Since her last visit with me she was admitted in February with acute respiratory failure due to PNA and the flu. She also had some diastolic CHF. In March she developed right sided weakness and spasticity. She was diagnosed with a Thalamic CVA. Treated with Seroquel. Symptoms improved she is now followed by home Hospice.   On follow up today she is doing well. She is no longer driving. No chest pain. No dizziness or palpitations. Minimal dyspnea on exertion.  last pacemaker check in May looked good.  Has rarely had to take an extra lasix for swelling.   Current Outpatient Prescriptions on File Prior to Visit  Medication Sig Dispense Refill  . amLODipine (NORVASC) 2.5 MG tablet Take 1 tablet (2.5 mg total) by mouth daily. 90 tablet 3  . benzonatate (TESSALON) 100 MG capsule Take 1 capsule (100 mg total) by mouth 3 (three) times daily as needed for cough. 20 capsule 0  . Calcium Carbonate (CALTRATE 600 PO) Take 1 capsule by mouth 2 (two) times daily.     . Cyanocobalamin (VITAMIN B-12 PO) Take 1 capsule by mouth daily as needed (takes occasionally).     Marland Kitchen digoxin (LANOXIN) 0.125 MG tablet Take 1 tablet (0.125 mg total) by mouth daily. 90 tablet 1  . docusate sodium (COLACE) 100 MG capsule Take 100 mg by mouth at bedtime.     . donepezil (ARICEPT) 10 MG tablet Take 10 mg by mouth at bedtime.      Marland Kitchen doxycycline (VIBRA-TABS) 100 MG tablet Take 1 tablet (100 mg total) by mouth 2 (two) times daily. 6 tablet 0  . fish oil-omega-3 fatty acids 1000 MG capsule Take 1 g by mouth daily.     . Fluticasone-Salmeterol (ADVAIR DISKUS) 250-50 MCG/DOSE AEPB Inhale 1 puff into the lungs every 12 (twelve) hours.      . furosemide (LASIX) 20 MG tablet Take 2 tablets (40 mg total) by mouth daily. Pt can take 1  extra tablet by mouth daily as needed for weight gain 30 tablet 0  . Garlic 100 MG TABS Take 1 tablet by mouth daily.     Marland Kitchen HYDROcodone-acetaminophen (NORCO) 10-325 MG per tablet Take 1 tablet by mouth every 6 (six) hours as needed (pain).     . metoprolol succinate (TOPROL-XL) 100 MG 24 hr tablet Take 1 tablet (100 mg total) by mouth daily. Take with or immediately following a meal. 90 tablet 2  . montelukast (SINGULAIR) 10 MG tablet Take 10 mg by mouth at bedtime as needed (for breathing).     . Multiple Vitamins-Minerals (OCUVITE ADULT 50+ PO) Take 1 capsule by mouth daily.     Marland Kitchen NAMENDA XR 28 MG CP24 24 hr capsule Take 28 mg by mouth daily.  6  . Probiotic Product (ALIGN) 4 MG CAPS Take 4 mg by mouth daily.    . QUEtiapine (SEROQUEL) 25 MG tablet Take 50 mg by mouth 2 (two) times daily.     Marland Kitchen warfarin (COUMADIN) 5 MG tablet Take 5-7.5 mg by mouth daily at 6 PM. 7.5mg  on Mon, Wed and Fri   all other days     No current facility-administered medications on file prior to visit.    Allergies  Allergen Reactions  . Prednisone Other (See Comments)  Long term has reaction; felt like passing out  . Sulfa Antibiotics Nausea And Vomiting  . Amoxicillin Rash    ONLY IN IV (PO OK)    Past Medical History  Diagnosis Date  . Hypertension   . Asthma   . Dementia     mild  . Osteoarthritis   . Gallstones   . Chronic kidney disease     Stage 2 to 3  . Anemia   . Stroke [facility](HCC)     Remote R cerebellar stroke with gait abnormaility  . Atrial fibrillation (HCC)     permanent  . Coronary artery disease   . Anticoagulant long-term use   . Chronic diastolic CHF (congestive heart failure) (HCC)   . Valvular heart disease     Mitral and tricuspid insufficiency  . Tachy-brady syndrome (HCC)   . Pacemaker May 2012    VVI per Dr. Johney FrameAllred  . Flu 08/2015  . GERD (gastroesophageal reflux disease)     Past Surgical History  Procedure Laterality Date  . Abdominal hysterectomy      total  .  Appendectomy    . Hemorrhoid surgery    . Cataract extraction, bilateral    . Total knee arthroplasty    . Cholecystectomy    . Pacemaker insertion  May 2012    St Jude Accent SR RF implant. Dr. Johney FrameAllred    History  Smoking status  . Never Smoker   Smokeless tobacco  . Never Used    History  Alcohol Use No    Family History  Problem Relation Age of Onset  . Clotting disorder Mother   . Suicidality Father     Review of Systems: The review of systems is as above.  All other systems were reviewed and are negative.  Physical Exam: BP 104/50 mmHg  Pulse 55  Ht 5\' 7"  (1.702 m)  Wt 125 lb (56.7 kg)  BMI 19.57 kg/m2 Patient is very pleasant and in no acute distress.  Skin is warm and dry. Color is normal.  HEENT is unremarkable. Normocephalic/atraumatic. PERRL. Sclera are nonicteric. Neck is supple. No masses. No JVD. Lungs are clear. Cardiac exam shows an irregular rate and rhythm. Normal S1 and S2. No gallops or murmur. Abdomen is soft. Extremities reveal trace edema. Gait and ROM are intact. No gross neurologic deficits noted.  LABORATORY DATA:     Assessment / Plan: 1. Atrial fibrillation, permanent. Managed with rate control and anticoagulation. Continue current dose of digoxin and metoprolol. [facility].  2. Chronic congestive heart failure with diastolic dysfunction. No significant edema. Weight is down 6 lbs.  Continue Lasix 20 mg per day. She may take an extra dose if she is retaining fluid her increasing weight.  3. Sick sinus syndrome status post pacemaker implant in May of 2012. Followed in our pacemaker clinic. Last check  was good.   4. HTN- well controlled.   5. S/p Thalamic CVA. No records available. Will request records from Dr. Felipa EthAvva. She seems to be doing quite well now.   I will follow up in 6 months.

## 2015-12-20 NOTE — Patient Instructions (Signed)
Continue your current therapy  I will see you in 6 months.   

## 2015-12-22 DIAGNOSIS — K219 Gastro-esophageal reflux disease without esophagitis: Secondary | ICD-10-CM | POA: Diagnosis not present

## 2015-12-22 DIAGNOSIS — I4891 Unspecified atrial fibrillation: Secondary | ICD-10-CM | POA: Diagnosis not present

## 2015-12-22 DIAGNOSIS — I679 Cerebrovascular disease, unspecified: Secondary | ICD-10-CM | POA: Diagnosis not present

## 2015-12-22 DIAGNOSIS — I509 Heart failure, unspecified: Secondary | ICD-10-CM | POA: Diagnosis not present

## 2015-12-22 DIAGNOSIS — J301 Allergic rhinitis due to pollen: Secondary | ICD-10-CM | POA: Diagnosis not present

## 2015-12-22 DIAGNOSIS — G309 Alzheimer's disease, unspecified: Secondary | ICD-10-CM | POA: Diagnosis not present

## 2015-12-23 DIAGNOSIS — I679 Cerebrovascular disease, unspecified: Secondary | ICD-10-CM | POA: Diagnosis not present

## 2015-12-23 DIAGNOSIS — G309 Alzheimer's disease, unspecified: Secondary | ICD-10-CM | POA: Diagnosis not present

## 2015-12-23 DIAGNOSIS — K219 Gastro-esophageal reflux disease without esophagitis: Secondary | ICD-10-CM | POA: Diagnosis not present

## 2015-12-23 DIAGNOSIS — J301 Allergic rhinitis due to pollen: Secondary | ICD-10-CM | POA: Diagnosis not present

## 2015-12-23 DIAGNOSIS — I509 Heart failure, unspecified: Secondary | ICD-10-CM | POA: Diagnosis not present

## 2015-12-23 DIAGNOSIS — I4891 Unspecified atrial fibrillation: Secondary | ICD-10-CM | POA: Diagnosis not present

## 2015-12-24 DIAGNOSIS — G309 Alzheimer's disease, unspecified: Secondary | ICD-10-CM | POA: Diagnosis not present

## 2015-12-24 DIAGNOSIS — J301 Allergic rhinitis due to pollen: Secondary | ICD-10-CM | POA: Diagnosis not present

## 2015-12-24 DIAGNOSIS — I679 Cerebrovascular disease, unspecified: Secondary | ICD-10-CM | POA: Diagnosis not present

## 2015-12-24 DIAGNOSIS — I4891 Unspecified atrial fibrillation: Secondary | ICD-10-CM | POA: Diagnosis not present

## 2015-12-24 DIAGNOSIS — K219 Gastro-esophageal reflux disease without esophagitis: Secondary | ICD-10-CM | POA: Diagnosis not present

## 2015-12-24 DIAGNOSIS — I509 Heart failure, unspecified: Secondary | ICD-10-CM | POA: Diagnosis not present

## 2015-12-25 DIAGNOSIS — G309 Alzheimer's disease, unspecified: Secondary | ICD-10-CM | POA: Diagnosis not present

## 2015-12-25 DIAGNOSIS — K219 Gastro-esophageal reflux disease without esophagitis: Secondary | ICD-10-CM | POA: Diagnosis not present

## 2015-12-25 DIAGNOSIS — J301 Allergic rhinitis due to pollen: Secondary | ICD-10-CM | POA: Diagnosis not present

## 2015-12-25 DIAGNOSIS — I4891 Unspecified atrial fibrillation: Secondary | ICD-10-CM | POA: Diagnosis not present

## 2015-12-25 DIAGNOSIS — I679 Cerebrovascular disease, unspecified: Secondary | ICD-10-CM | POA: Diagnosis not present

## 2015-12-25 DIAGNOSIS — I509 Heart failure, unspecified: Secondary | ICD-10-CM | POA: Diagnosis not present

## 2015-12-27 DIAGNOSIS — I509 Heart failure, unspecified: Secondary | ICD-10-CM | POA: Diagnosis not present

## 2015-12-27 DIAGNOSIS — J301 Allergic rhinitis due to pollen: Secondary | ICD-10-CM | POA: Diagnosis not present

## 2015-12-27 DIAGNOSIS — I4891 Unspecified atrial fibrillation: Secondary | ICD-10-CM | POA: Diagnosis not present

## 2015-12-27 DIAGNOSIS — G309 Alzheimer's disease, unspecified: Secondary | ICD-10-CM | POA: Diagnosis not present

## 2015-12-27 DIAGNOSIS — I679 Cerebrovascular disease, unspecified: Secondary | ICD-10-CM | POA: Diagnosis not present

## 2015-12-27 DIAGNOSIS — K219 Gastro-esophageal reflux disease without esophagitis: Secondary | ICD-10-CM | POA: Diagnosis not present

## 2015-12-29 DIAGNOSIS — J301 Allergic rhinitis due to pollen: Secondary | ICD-10-CM | POA: Diagnosis not present

## 2015-12-29 DIAGNOSIS — G309 Alzheimer's disease, unspecified: Secondary | ICD-10-CM | POA: Diagnosis not present

## 2015-12-29 DIAGNOSIS — K219 Gastro-esophageal reflux disease without esophagitis: Secondary | ICD-10-CM | POA: Diagnosis not present

## 2015-12-29 DIAGNOSIS — I679 Cerebrovascular disease, unspecified: Secondary | ICD-10-CM | POA: Diagnosis not present

## 2015-12-29 DIAGNOSIS — I4891 Unspecified atrial fibrillation: Secondary | ICD-10-CM | POA: Diagnosis not present

## 2015-12-29 DIAGNOSIS — I509 Heart failure, unspecified: Secondary | ICD-10-CM | POA: Diagnosis not present

## 2016-01-05 DIAGNOSIS — I638 Other cerebral infarction: Secondary | ICD-10-CM | POA: Diagnosis not present

## 2016-01-05 DIAGNOSIS — Z7901 Long term (current) use of anticoagulants: Secondary | ICD-10-CM | POA: Diagnosis not present

## 2016-01-05 DIAGNOSIS — I4891 Unspecified atrial fibrillation: Secondary | ICD-10-CM | POA: Diagnosis not present

## 2016-01-05 DIAGNOSIS — R2689 Other abnormalities of gait and mobility: Secondary | ICD-10-CM | POA: Diagnosis not present

## 2016-02-05 DIAGNOSIS — Z7901 Long term (current) use of anticoagulants: Secondary | ICD-10-CM | POA: Diagnosis not present

## 2016-02-05 DIAGNOSIS — I4891 Unspecified atrial fibrillation: Secondary | ICD-10-CM | POA: Diagnosis not present

## 2016-02-15 DIAGNOSIS — G255 Other chorea: Secondary | ICD-10-CM | POA: Diagnosis not present

## 2016-02-15 DIAGNOSIS — Z950 Presence of cardiac pacemaker: Secondary | ICD-10-CM | POA: Diagnosis not present

## 2016-02-15 DIAGNOSIS — M1990 Unspecified osteoarthritis, unspecified site: Secondary | ICD-10-CM | POA: Diagnosis not present

## 2016-02-15 DIAGNOSIS — I4891 Unspecified atrial fibrillation: Secondary | ICD-10-CM | POA: Diagnosis not present

## 2016-02-15 DIAGNOSIS — Z7901 Long term (current) use of anticoagulants: Secondary | ICD-10-CM | POA: Diagnosis not present

## 2016-02-15 DIAGNOSIS — I5189 Other ill-defined heart diseases: Secondary | ICD-10-CM | POA: Diagnosis not present

## 2016-02-15 DIAGNOSIS — R413 Other amnesia: Secondary | ICD-10-CM | POA: Diagnosis not present

## 2016-02-29 ENCOUNTER — Ambulatory Visit (INDEPENDENT_AMBULATORY_CARE_PROVIDER_SITE_OTHER): Payer: Medicare Other | Admitting: *Deleted

## 2016-02-29 ENCOUNTER — Telehealth: Payer: Self-pay | Admitting: Cardiology

## 2016-02-29 DIAGNOSIS — I495 Sick sinus syndrome: Secondary | ICD-10-CM

## 2016-02-29 NOTE — Telephone Encounter (Signed)
Spoke with pt and reminded pt of remote transmission that is due today. Pt verbalized understanding.   

## 2016-03-01 NOTE — Progress Notes (Signed)
Remote pacemaker transmission.   

## 2016-03-05 LAB — CUP PACEART REMOTE DEVICE CHECK
Battery Remaining Longevity: 147 mo
Battery Remaining Percentage: 95.5 %
Brady Statistic RV Percent Paced: 73 %
Implantable Lead Location: 753860
Implantable Lead Model: 1948
Lead Channel Pacing Threshold Amplitude: 0.75 V
Lead Channel Setting Pacing Amplitude: 2.5 V
Lead Channel Setting Sensing Sensitivity: 2 mV
MDC IDC LEAD IMPLANT DT: 20120529
MDC IDC MSMT BATTERY VOLTAGE: 2.98 V
MDC IDC MSMT LEADCHNL RV IMPEDANCE VALUE: 580 Ohm
MDC IDC MSMT LEADCHNL RV PACING THRESHOLD PULSEWIDTH: 0.4 ms
MDC IDC MSMT LEADCHNL RV SENSING INTR AMPL: 12 mV
MDC IDC PG SERIAL: 7222573
MDC IDC SESS DTM: 20170824235523
MDC IDC SET LEADCHNL RV PACING PULSEWIDTH: 0.4 ms

## 2016-03-07 ENCOUNTER — Encounter: Payer: Self-pay | Admitting: Cardiology

## 2016-03-12 DIAGNOSIS — Z7901 Long term (current) use of anticoagulants: Secondary | ICD-10-CM | POA: Diagnosis not present

## 2016-03-12 DIAGNOSIS — I4891 Unspecified atrial fibrillation: Secondary | ICD-10-CM | POA: Diagnosis not present

## 2016-04-08 DIAGNOSIS — H5340 Unspecified visual field defects: Secondary | ICD-10-CM | POA: Diagnosis not present

## 2016-04-08 DIAGNOSIS — H524 Presbyopia: Secondary | ICD-10-CM | POA: Diagnosis not present

## 2016-04-15 DIAGNOSIS — R2689 Other abnormalities of gait and mobility: Secondary | ICD-10-CM | POA: Diagnosis not present

## 2016-04-15 DIAGNOSIS — I4891 Unspecified atrial fibrillation: Secondary | ICD-10-CM | POA: Diagnosis not present

## 2016-04-15 DIAGNOSIS — Z7901 Long term (current) use of anticoagulants: Secondary | ICD-10-CM | POA: Diagnosis not present

## 2016-04-15 DIAGNOSIS — Z23 Encounter for immunization: Secondary | ICD-10-CM | POA: Diagnosis not present

## 2016-05-13 DIAGNOSIS — Z7901 Long term (current) use of anticoagulants: Secondary | ICD-10-CM | POA: Diagnosis not present

## 2016-05-13 DIAGNOSIS — I4891 Unspecified atrial fibrillation: Secondary | ICD-10-CM | POA: Diagnosis not present

## 2016-06-04 ENCOUNTER — Ambulatory Visit (INDEPENDENT_AMBULATORY_CARE_PROVIDER_SITE_OTHER): Payer: Medicare Other | Admitting: *Deleted

## 2016-06-04 DIAGNOSIS — I495 Sick sinus syndrome: Secondary | ICD-10-CM | POA: Diagnosis not present

## 2016-06-05 NOTE — Progress Notes (Signed)
Remote pacemaker transmission.   

## 2016-06-06 ENCOUNTER — Encounter: Payer: Self-pay | Admitting: Cardiology

## 2016-06-17 DIAGNOSIS — Z7901 Long term (current) use of anticoagulants: Secondary | ICD-10-CM | POA: Diagnosis not present

## 2016-06-17 DIAGNOSIS — G255 Other chorea: Secondary | ICD-10-CM | POA: Diagnosis not present

## 2016-06-17 DIAGNOSIS — I5189 Other ill-defined heart diseases: Secondary | ICD-10-CM | POA: Diagnosis not present

## 2016-06-17 DIAGNOSIS — R413 Other amnesia: Secondary | ICD-10-CM | POA: Diagnosis not present

## 2016-06-17 DIAGNOSIS — I4891 Unspecified atrial fibrillation: Secondary | ICD-10-CM | POA: Diagnosis not present

## 2016-06-17 DIAGNOSIS — R634 Abnormal weight loss: Secondary | ICD-10-CM | POA: Diagnosis not present

## 2016-06-17 DIAGNOSIS — J302 Other seasonal allergic rhinitis: Secondary | ICD-10-CM | POA: Diagnosis not present

## 2016-06-17 DIAGNOSIS — D539 Nutritional anemia, unspecified: Secondary | ICD-10-CM | POA: Diagnosis not present

## 2016-06-17 DIAGNOSIS — I10 Essential (primary) hypertension: Secondary | ICD-10-CM | POA: Diagnosis not present

## 2016-07-03 LAB — CUP PACEART REMOTE DEVICE CHECK
Battery Remaining Longevity: 146 mo
Battery Remaining Percentage: 95.5 %
Battery Voltage: 2.98 V
Brady Statistic RV Percent Paced: 77 %
Implantable Lead Implant Date: 20120529
Implantable Lead Model: 1948
Implantable Pulse Generator Implant Date: 20120529
Lead Channel Impedance Value: 560 Ohm
Lead Channel Pacing Threshold Pulse Width: 0.4 ms
Lead Channel Setting Pacing Amplitude: 2.5 V
Lead Channel Setting Pacing Pulse Width: 0.4 ms
Lead Channel Setting Sensing Sensitivity: 2 mV
MDC IDC LEAD LOCATION: 753860
MDC IDC MSMT LEADCHNL RV PACING THRESHOLD AMPLITUDE: 0.75 V
MDC IDC MSMT LEADCHNL RV SENSING INTR AMPL: 12 mV
MDC IDC PG SERIAL: 7222573
MDC IDC SESS DTM: 20171128072309
Pulse Gen Model: 1210

## 2016-07-15 DIAGNOSIS — Z7901 Long term (current) use of anticoagulants: Secondary | ICD-10-CM | POA: Diagnosis not present

## 2016-07-15 DIAGNOSIS — I4891 Unspecified atrial fibrillation: Secondary | ICD-10-CM | POA: Diagnosis not present

## 2016-08-12 DIAGNOSIS — Z7901 Long term (current) use of anticoagulants: Secondary | ICD-10-CM | POA: Diagnosis not present

## 2016-08-12 DIAGNOSIS — I4891 Unspecified atrial fibrillation: Secondary | ICD-10-CM | POA: Diagnosis not present

## 2016-09-02 DIAGNOSIS — Z7901 Long term (current) use of anticoagulants: Secondary | ICD-10-CM | POA: Diagnosis not present

## 2016-09-02 DIAGNOSIS — I4891 Unspecified atrial fibrillation: Secondary | ICD-10-CM | POA: Diagnosis not present

## 2016-09-03 ENCOUNTER — Ambulatory Visit (INDEPENDENT_AMBULATORY_CARE_PROVIDER_SITE_OTHER): Payer: Medicare Other | Admitting: *Deleted

## 2016-09-03 DIAGNOSIS — I495 Sick sinus syndrome: Secondary | ICD-10-CM | POA: Diagnosis not present

## 2016-09-03 NOTE — Progress Notes (Signed)
Remote pacemaker transmission.   

## 2016-09-04 ENCOUNTER — Encounter: Payer: Self-pay | Admitting: Cardiology

## 2016-09-06 LAB — CUP PACEART REMOTE DEVICE CHECK
Battery Remaining Percentage: 95.5 %
Battery Voltage: 2.98 V
Brady Statistic RV Percent Paced: 79 %
Implantable Pulse Generator Implant Date: 20120529
Lead Channel Impedance Value: 550 Ohm
Lead Channel Pacing Threshold Pulse Width: 0.4 ms
Lead Channel Setting Pacing Amplitude: 2.5 V
Lead Channel Setting Pacing Pulse Width: 0.4 ms
MDC IDC LEAD IMPLANT DT: 20120529
MDC IDC LEAD LOCATION: 753860
MDC IDC MSMT BATTERY REMAINING LONGEVITY: 146 mo
MDC IDC MSMT LEADCHNL RV PACING THRESHOLD AMPLITUDE: 0.75 V
MDC IDC MSMT LEADCHNL RV SENSING INTR AMPL: 12 mV
MDC IDC PG SERIAL: 7222573
MDC IDC SESS DTM: 20180227075545
MDC IDC SET LEADCHNL RV SENSING SENSITIVITY: 2 mV
Pulse Gen Model: 1210

## 2016-09-23 DIAGNOSIS — I4891 Unspecified atrial fibrillation: Secondary | ICD-10-CM | POA: Diagnosis not present

## 2016-09-23 DIAGNOSIS — Z7901 Long term (current) use of anticoagulants: Secondary | ICD-10-CM | POA: Diagnosis not present

## 2016-11-04 DIAGNOSIS — G255 Other chorea: Secondary | ICD-10-CM | POA: Diagnosis not present

## 2016-11-04 DIAGNOSIS — R413 Other amnesia: Secondary | ICD-10-CM | POA: Diagnosis not present

## 2016-11-04 DIAGNOSIS — I5189 Other ill-defined heart diseases: Secondary | ICD-10-CM | POA: Diagnosis not present

## 2016-11-04 DIAGNOSIS — D539 Nutritional anemia, unspecified: Secondary | ICD-10-CM | POA: Diagnosis not present

## 2016-11-04 DIAGNOSIS — Z7901 Long term (current) use of anticoagulants: Secondary | ICD-10-CM | POA: Diagnosis not present

## 2016-11-04 DIAGNOSIS — I10 Essential (primary) hypertension: Secondary | ICD-10-CM | POA: Diagnosis not present

## 2016-11-04 DIAGNOSIS — K219 Gastro-esophageal reflux disease without esophagitis: Secondary | ICD-10-CM | POA: Diagnosis not present

## 2016-11-04 DIAGNOSIS — I4891 Unspecified atrial fibrillation: Secondary | ICD-10-CM | POA: Diagnosis not present

## 2016-11-04 DIAGNOSIS — Z6821 Body mass index (BMI) 21.0-21.9, adult: Secondary | ICD-10-CM | POA: Diagnosis not present

## 2016-11-04 DIAGNOSIS — Z1389 Encounter for screening for other disorder: Secondary | ICD-10-CM | POA: Diagnosis not present

## 2016-11-04 DIAGNOSIS — Z950 Presence of cardiac pacemaker: Secondary | ICD-10-CM | POA: Diagnosis not present

## 2016-12-09 DIAGNOSIS — I4891 Unspecified atrial fibrillation: Secondary | ICD-10-CM | POA: Diagnosis not present

## 2016-12-09 DIAGNOSIS — Z7901 Long term (current) use of anticoagulants: Secondary | ICD-10-CM | POA: Diagnosis not present

## 2016-12-29 NOTE — Progress Notes (Signed)
Electrophysiology Office Note Date: 01/01/2017  ID:  Susan [name], DOB 11/21/1920, MRN [id number]  PCP: Chilton GreathouseAvva, Ravisankar, MD Primary Cardiologist: [place] Electrophysiologist: Allred   CC: Pacemaker follow-up  Susan [name] is a 83 y.o. female seen today for Dr Johney FrameAllred.  She presents today for routine electrophysiology followup.  Since last being seen in our clinic, the patient reports doing reasonably well.  She has had increased fatigue recently and associated shortness of breath with exertion. She denies chest pain, palpitations, dyspnea, PND, orthopnea, nausea, vomiting, dizziness, syncope, edema, weight gain, or early satiety.  Device History: STJ single chamber PPM implanted 2012 for tachy brady    Past Medical History:  Diagnosis Date  . Anemia   . Anticoagulant long-term use   . Asthma   . Atrial fibrillation (HCC)    permanent  . Chronic diastolic CHF (congestive heart failure) (HCC)   . Chronic kidney disease    Stage 2 to 3  . Coronary artery disease   . Dementia    mild  . Flu 08/2015  . Gallstones   . GERD (gastroesophageal reflux disease)   . Hypertension   . Osteoarthritis   . Pacemaker May 2012   VVI per Dr. Johney FrameAllred  . Stroke [facility](HCC)    Remote R cerebellar stroke with gait abnormaility  . Tachy-brady syndrome (HCC)   . Valvular heart disease    Mitral and tricuspid insufficiency   Past Surgical History:  Procedure Laterality Date  . ABDOMINAL HYSTERECTOMY     total  . APPENDECTOMY    . CATARACT EXTRACTION, BILATERAL    . CHOLECYSTECTOMY    . HEMORRHOID SURGERY    . PACEMAKER INSERTION  May 2012   St Jude Accent SR RF implant. Dr. Johney FrameAllred  . TOTAL KNEE ARTHROPLASTY      Current Outpatient Prescriptions  Medication Sig Dispense Refill  . amLODipine (NORVASC) 2.5 MG tablet Take 1 tablet (2.5 mg total) by mouth daily. 90 tablet 3  . benzonatate (TESSALON) 100 MG capsule Take 1 capsule (100 mg total) by mouth 3 (three) times daily as needed for  cough. 20 capsule 0  . Calcium Carbonate (CALTRATE 600 PO) Take 1 capsule by mouth 2 (two) times daily.     . Cyanocobalamin (VITAMIN B-12 PO) Take 1 capsule by mouth daily as needed (takes occasionally).     Marland Kitchen. dexlansoprazole (DEXILANT) 60 MG capsule Take 60 mg by mouth daily.    . digoxin (LANOXIN) 0.125 MG tablet Take 1 tablet (0.125 mg total) by mouth daily. 90 tablet 1  . docusate sodium (COLACE) 100 MG capsule Take 100 mg by mouth at bedtime.     . donepezil (ARICEPT) 10 MG tablet Take 10 mg by mouth at bedtime.      Marland Kitchen. doxycycline (VIBRA-TABS) 100 MG tablet Take 1 tablet (100 mg total) by mouth 2 (two) times daily. 6 tablet 0  . fish oil-omega-3 fatty acids 1000 MG capsule Take 1 g by mouth daily.     . Fluticasone-Salmeterol (ADVAIR DISKUS) 250-50 MCG/DOSE AEPB Inhale 1 puff into the lungs every 12 (twelve) hours.      . furosemide (LASIX) 20 MG tablet Take 2 tablets (40 mg total) by mouth daily. Pt can take 1 extra tablet by mouth daily as needed for weight gain 30 tablet 0  . Garlic 100 MG TABS Take 1 tablet by mouth daily.     Marland Kitchen. HYDROcodone-acetaminophen (NORCO) 10-325 MG per tablet Take 1 tablet by mouth every 6 (  six) hours as needed (pain).     . metoprolol succinate (TOPROL-XL) 100 MG 24 hr tablet Take 1 tablet (100 mg total) by mouth daily. Take with or immediately following a meal. 90 tablet 2  . montelukast (SINGULAIR) 10 MG tablet Take 10 mg by mouth at bedtime as needed (for breathing).     . Multiple Vitamins-Minerals (OCUVITE ADULT 50+ PO) Take 1 capsule by mouth daily.     Marland Kitchen NAMENDA XR 28 MG CP24 24 hr capsule Take 28 mg by mouth daily.  6  . Probiotic Product (ALIGN) 4 MG CAPS Take 4 mg by mouth daily.    . QUEtiapine (SEROQUEL) 25 MG tablet Take 50 mg by mouth 2 (two) times daily.     Marland Kitchen warfarin (COUMADIN) 5 MG tablet Take 5-7.5 mg by mouth daily at 6 PM. 7.5mg  on Mon, Wed and Fri  5mg  all other days     No current facility-administered medications for this visit.      Allergies:   Prednisone; Sulfa antibiotics; and Amoxicillin   Social History: Social History   Social History  . Marital status: Widowed    Spouse name: N/A  . Number of children: N/A  . Years of education: N/A   Occupational History  . Not on file.   Social History Main Topics  . Smoking status: Never Smoker  . Smokeless tobacco: Never Used  . Alcohol use No  . Drug use: No  . Sexual activity: Not on file   Other Topics Concern  . Not on file   Social History Narrative   Pt lives in [place] with daughter.  Attends [employer]   Retired from an [employer].    Family History: Family History  Problem Relation Age of Onset  . Clotting disorder Mother   . Suicidality Father      Review of Systems: All other systems reviewed and are otherwise negative except as noted above.   Physical Exam: VS:  BP 112/64   Pulse (!) 51   Ht 5\' 6"  (1.676 m)   Wt 134 lb 2 oz (60.8 kg)   SpO2 96%   BMI 21.65 kg/m  , BMI Body mass index is 21.65 kg/m.  GEN- The patient is elderly appearing, alert and oriented x 3 today.   HEENT: normocephalic, atraumatic; sclera clear, conjunctiva pink; hearing intact; oropharynx clear; neck supple  Lungs- Clear to ausculation bilaterally, normal work of breathing.  No wheezes, rales, rhonchi Heart- Irregular rate and rhythm  GI- soft, non-tender, non-distended, bowel sounds present  Extremities- no clubbing, cyanosis, or edema  MS- no significant deformity or atrophy Skin- warm and dry, no rash or lesion; PPM pocket well healed Psych- euthymic mood, full affect Neuro- strength and sensation are intact  PPM Interrogation- reviewed in detail today,  See PACEART report  EKG:  EKG is not ordered today.  Recent Labs: No results found for requested labs within last 8760 hours.   Wt Readings from Last 3 Encounters:  01/01/17 134 lb 2 oz (60.8 kg)  12/20/15 125 lb (56.7 kg)  11/30/15 131 lb (59.4 kg)      Other studies Reviewed: Additional studies/ records that were reviewed today include: Dr Johney Frame and Dr Elvis Coil office notes  Assessment and Plan:  1.  Tachy brady syndrome Normal PPM function See Arita Miss Art report She has had an increase in V pacing percentage since I last saw her. I think that chronotropic incompetence is likely responsible for exercise intolerance. I  have turned the sensor on today.  2.  Permanent atrial fibrillation V rates controlled by device interrogation today Continue Warfarin for CHADS2VASC of 6 - INR and CBC followed by [facility] per patient No recent bleeding issues   3. HTN Stable No change required today    Current medicines are reviewed at length with the patient today.   The patient does not have concerns regarding her medicines.  The following changes were made today:  none  Labs/ tests ordered today include: none    Disposition:   Follow up with Merlin transmissions, Dr [place] 6 months, me in 1 year    Signed, Gypsy Balsam, NP 01/01/2017 11:16 AM  [facility] [address] [place] [address] [phone] (office) [phone] (fax)

## 2017-01-01 ENCOUNTER — Ambulatory Visit (INDEPENDENT_AMBULATORY_CARE_PROVIDER_SITE_OTHER): Payer: Medicare Other | Admitting: Nurse Practitioner

## 2017-01-01 VITALS — BP 112/64 | HR 51 | Ht 66.0 in | Wt 134.1 lb

## 2017-01-01 DIAGNOSIS — I495 Sick sinus syndrome: Secondary | ICD-10-CM | POA: Diagnosis not present

## 2017-01-01 DIAGNOSIS — I482 Chronic atrial fibrillation: Secondary | ICD-10-CM

## 2017-01-01 DIAGNOSIS — I10 Essential (primary) hypertension: Secondary | ICD-10-CM | POA: Diagnosis not present

## 2017-01-01 DIAGNOSIS — I4821 Permanent atrial fibrillation: Secondary | ICD-10-CM

## 2017-01-01 LAB — CUP PACEART INCLINIC DEVICE CHECK
Date Time Interrogation Session: 20180627113213
Implantable Lead Location: 753860
MDC IDC LEAD IMPLANT DT: 20120529
MDC IDC PG IMPLANT DT: 20120529
MDC IDC PG SERIAL: 7222573
Pulse Gen Model: 1210

## 2017-01-01 NOTE — Patient Instructions (Signed)
Medication Instructions:  Your physician recommends that you continue on your current medications as directed. Please refer to the Current Medication list given to you today.   Labwork: None Ordered   Testing/Procedures: None Ordered   Follow-Up: Your physician wants you to follow-up in: 1 year with Gypsy BalsamAmber Seiler, NP You will receive a reminder letter in the mail two months in advance. If you don't receive a letter, please call our office to schedule the follow-up appointment.   Remote monitoring is used to monitor your Pacemaker of ICD from home. This monitoring reduces the number of office visits required to check your device to one time per year. It allows us to keep an eye on the functioning of your device to ensure it is working properly. You are scheduled for a device check from home on 04/02/17. You may send your transmission at any time that day. If you have a wireless device, the transmission will be sent automatically. After your physician reviews your transmission, you will receive a postcard with your next transmission date.    Any Other Special Instructions Will Be Listed Below (If Applicable).     If you need a refill on your cardiac medications before your next appointment, please call your pharmacy.

## 2017-01-13 DIAGNOSIS — Z7901 Long term (current) use of anticoagulants: Secondary | ICD-10-CM | POA: Diagnosis not present

## 2017-01-13 DIAGNOSIS — I4891 Unspecified atrial fibrillation: Secondary | ICD-10-CM | POA: Diagnosis not present

## 2017-01-27 DIAGNOSIS — I4891 Unspecified atrial fibrillation: Secondary | ICD-10-CM | POA: Diagnosis not present

## 2017-01-27 DIAGNOSIS — Z7901 Long term (current) use of anticoagulants: Secondary | ICD-10-CM | POA: Diagnosis not present

## 2017-02-24 DIAGNOSIS — Z7901 Long term (current) use of anticoagulants: Secondary | ICD-10-CM | POA: Diagnosis not present

## 2017-02-24 DIAGNOSIS — I4891 Unspecified atrial fibrillation: Secondary | ICD-10-CM | POA: Diagnosis not present

## 2017-03-24 DIAGNOSIS — I5189 Other ill-defined heart diseases: Secondary | ICD-10-CM | POA: Diagnosis not present

## 2017-03-24 DIAGNOSIS — Z7901 Long term (current) use of anticoagulants: Secondary | ICD-10-CM | POA: Diagnosis not present

## 2017-03-24 DIAGNOSIS — R413 Other amnesia: Secondary | ICD-10-CM | POA: Diagnosis not present

## 2017-03-24 DIAGNOSIS — Z6822 Body mass index (BMI) 22.0-22.9, adult: Secondary | ICD-10-CM | POA: Diagnosis not present

## 2017-03-24 DIAGNOSIS — I4891 Unspecified atrial fibrillation: Secondary | ICD-10-CM | POA: Diagnosis not present

## 2017-03-24 DIAGNOSIS — Z23 Encounter for immunization: Secondary | ICD-10-CM | POA: Diagnosis not present

## 2017-03-24 DIAGNOSIS — I10 Essential (primary) hypertension: Secondary | ICD-10-CM | POA: Diagnosis not present

## 2017-03-31 DIAGNOSIS — H4720 Unspecified optic atrophy: Secondary | ICD-10-CM | POA: Diagnosis not present

## 2017-03-31 DIAGNOSIS — H04123 Dry eye syndrome of bilateral lacrimal glands: Secondary | ICD-10-CM | POA: Diagnosis not present

## 2017-03-31 DIAGNOSIS — H5203 Hypermetropia, bilateral: Secondary | ICD-10-CM | POA: Diagnosis not present

## 2017-04-02 ENCOUNTER — Ambulatory Visit (INDEPENDENT_AMBULATORY_CARE_PROVIDER_SITE_OTHER): Payer: Medicare Other | Admitting: *Deleted

## 2017-04-02 DIAGNOSIS — I495 Sick sinus syndrome: Secondary | ICD-10-CM | POA: Diagnosis not present

## 2017-04-02 NOTE — Progress Notes (Signed)
Remote pacemaker transmission.   

## 2017-04-03 ENCOUNTER — Encounter: Payer: Self-pay | Admitting: Cardiology

## 2017-04-03 NOTE — Progress Notes (Signed)
Letter  

## 2017-04-04 ENCOUNTER — Telehealth: Payer: Self-pay

## 2017-04-04 LAB — CUP PACEART REMOTE DEVICE CHECK
Battery Remaining Percentage: 95.5 %
Battery Voltage: 2.96 V
Brady Statistic RV Percent Paced: 90 %
Lead Channel Impedance Value: 560 Ohm
Lead Channel Sensing Intrinsic Amplitude: 12 mV
Lead Channel Setting Pacing Amplitude: 2.5 V
Lead Channel Setting Pacing Pulse Width: 0.4 ms
MDC IDC LEAD IMPLANT DT: 20120529
MDC IDC LEAD LOCATION: 753860
MDC IDC MSMT BATTERY REMAINING LONGEVITY: 136 mo
MDC IDC MSMT LEADCHNL RV PACING THRESHOLD AMPLITUDE: 0.75 V
MDC IDC MSMT LEADCHNL RV PACING THRESHOLD PULSEWIDTH: 0.4 ms
MDC IDC PG IMPLANT DT: 20120529
MDC IDC SESS DTM: 20180926071821
MDC IDC SET LEADCHNL RV SENSING SENSITIVITY: 2 mV
Pulse Gen Model: 1210
Pulse Gen Serial Number: 7222573

## 2017-04-04 NOTE — Telephone Encounter (Addendum)
Spoke with pt and informed her that some more tests needed to done in the office on her pacemaker d/t new noise being seen by her device, Pt denied any episodes of passing out or dizziness.  pt agreeable to appointment on 04/10/2017 at 4:00pm with device clinic.

## 2017-04-10 ENCOUNTER — Ambulatory Visit (HOSPITAL_COMMUNITY)
Admission: RE | Admit: 2017-04-10 | Discharge: 2017-04-10 | Disposition: A | Discharge status: Home / Self Care | Payer: Medicare Other | Source: Ambulatory Visit | Attending: Internal Medicine | Admitting: Internal Medicine

## 2017-04-10 ENCOUNTER — Ambulatory Visit (INDEPENDENT_AMBULATORY_CARE_PROVIDER_SITE_OTHER): Payer: Medicare Other | Admitting: *Deleted

## 2017-04-10 DIAGNOSIS — T829XXA Unspecified complication of cardiac and vascular prosthetic device, implant and graft, initial encounter: Secondary | ICD-10-CM | POA: Diagnosis not present

## 2017-04-10 DIAGNOSIS — I348 Other nonrheumatic mitral valve disorders: Secondary | ICD-10-CM | POA: Diagnosis not present

## 2017-04-10 DIAGNOSIS — I058 Other rheumatic mitral valve diseases: Secondary | ICD-10-CM | POA: Diagnosis not present

## 2017-04-10 DIAGNOSIS — Z45018 Encounter for adjustment and management of other part of cardiac pacemaker: Secondary | ICD-10-CM | POA: Diagnosis not present

## 2017-04-10 DIAGNOSIS — Z950 Presence of cardiac pacemaker: Secondary | ICD-10-CM | POA: Insufficient documentation

## 2017-04-10 MED ORDER — METOPROLOL SUCCINATE ER 50 MG PO TB24: 50.0000 mg | ORAL_TABLET | Freq: Every day | ORAL | 3 refills | Status: AC

## 2017-04-11 LAB — CUP PACEART INCLINIC DEVICE CHECK
Date Time Interrogation Session: 20181005141413
MDC IDC LEAD IMPLANT DT: 20120529
MDC IDC LEAD LOCATION: 753860
MDC IDC PG IMPLANT DT: 20120529
Pulse Gen Serial Number: 7222573

## 2017-04-11 NOTE — Progress Notes (Signed)
Device check in clinic by industry. The following changes were made - Sensor rate: 110bpm, recovery time: fast, slope: 9 and V sensitivity 5mV per JA. Vivi Barrack ordered - Remote check 12/26.

## 2017-04-14 DIAGNOSIS — Z7901 Long term (current) use of anticoagulants: Secondary | ICD-10-CM | POA: Diagnosis not present

## 2017-04-14 DIAGNOSIS — I4891 Unspecified atrial fibrillation: Secondary | ICD-10-CM | POA: Diagnosis not present

## 2017-04-16 ENCOUNTER — Telehealth: Payer: Self-pay

## 2017-04-16 NOTE — Telephone Encounter (Signed)
Spoke with Toby pts daughter( ok per DPR) informed her that chest x ray was ok and lead had not moved. Pts daughter appreciative of call.

## 2017-04-16 NOTE — Telephone Encounter (Signed)
-----   Message from Hillis Range, MD sent at 04/16/2017 10:07 AM EDT ----- Results reviewed.  Lead unchanged in location.  Please inform pt of result.

## 2017-05-12 DIAGNOSIS — I4891 Unspecified atrial fibrillation: Secondary | ICD-10-CM | POA: Diagnosis not present

## 2017-05-12 DIAGNOSIS — Z7901 Long term (current) use of anticoagulants: Secondary | ICD-10-CM | POA: Diagnosis not present

## 2017-06-09 DIAGNOSIS — I4891 Unspecified atrial fibrillation: Secondary | ICD-10-CM | POA: Diagnosis not present

## 2017-06-09 DIAGNOSIS — Z7901 Long term (current) use of anticoagulants: Secondary | ICD-10-CM | POA: Diagnosis not present

## 2017-06-09 DIAGNOSIS — Z79899 Other long term (current) drug therapy: Secondary | ICD-10-CM | POA: Diagnosis not present

## 2017-06-12 IMAGING — CR DG CHEST 1V PORT
2 series · 2 of 2 positions shown · non-contrast
Comparison: 01/02/13

CLINICAL DATA: Increased shortness of Breath

EXAM:
PORTABLE CHEST 1 VIEW

[AP (1 of 2)]
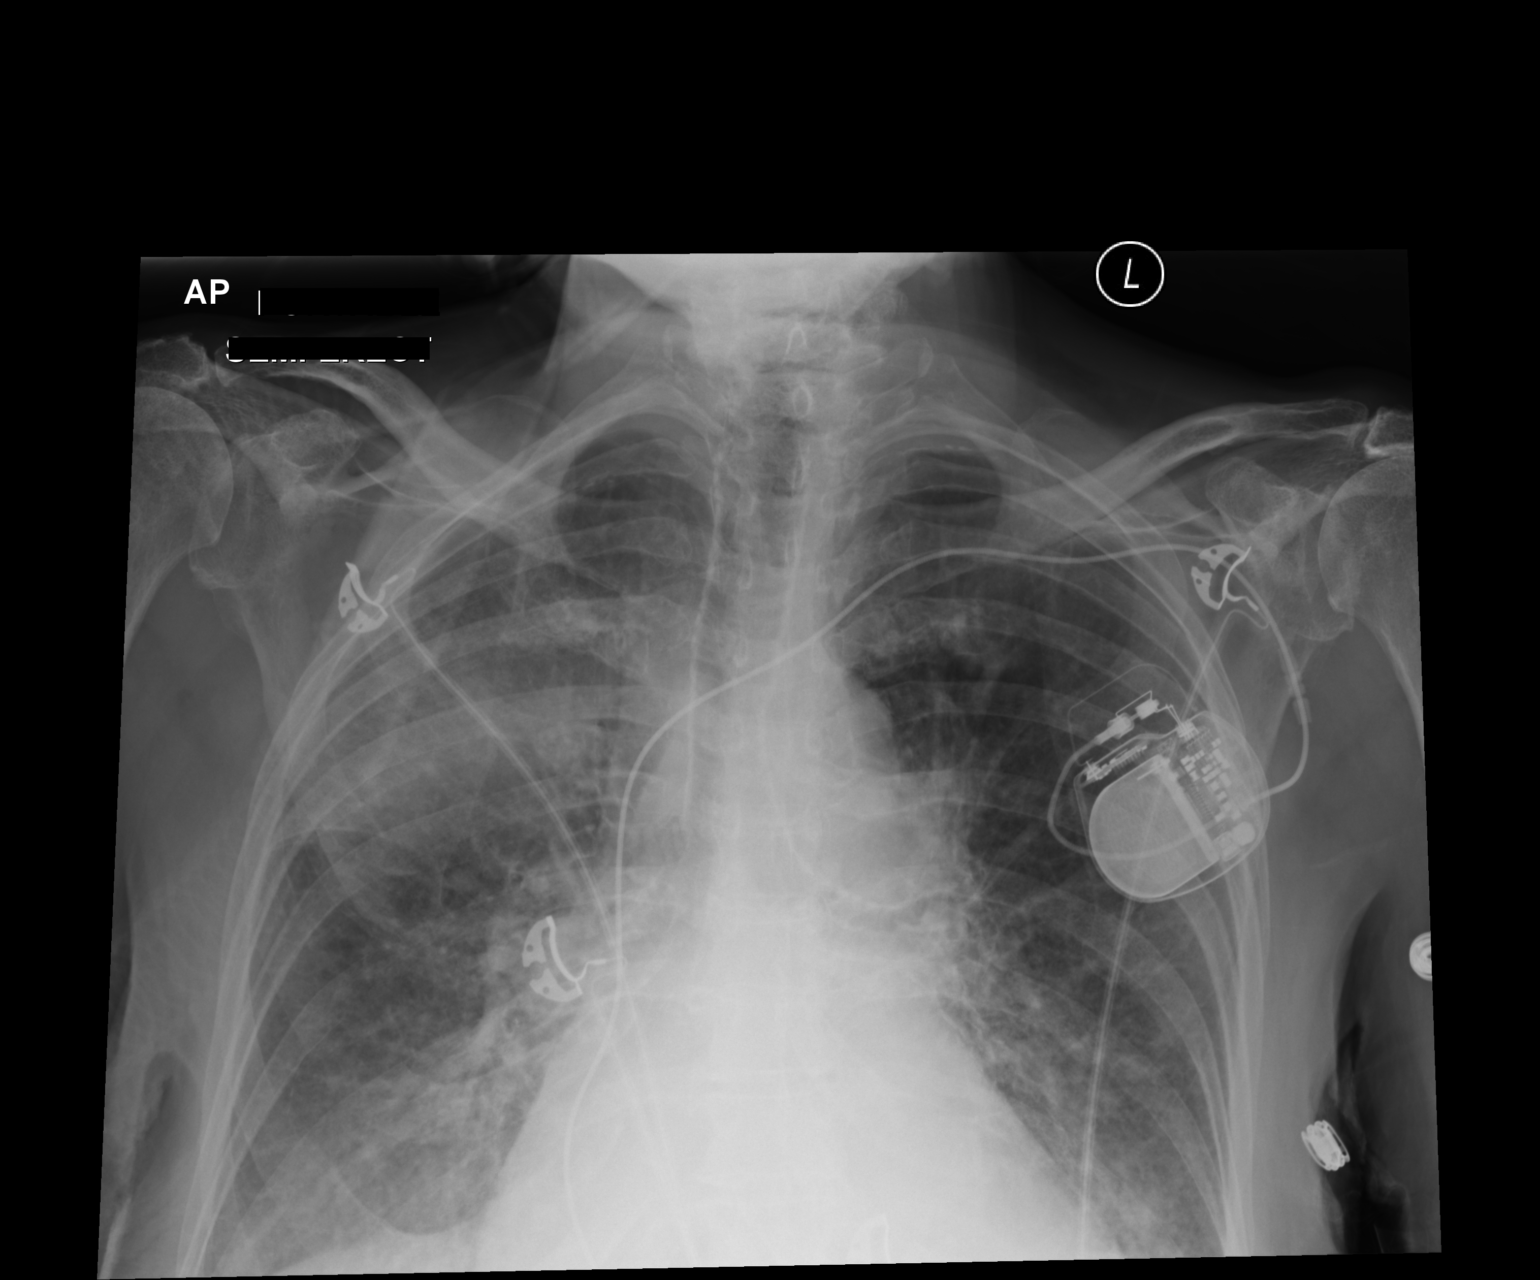

[AP (2 of 2)]
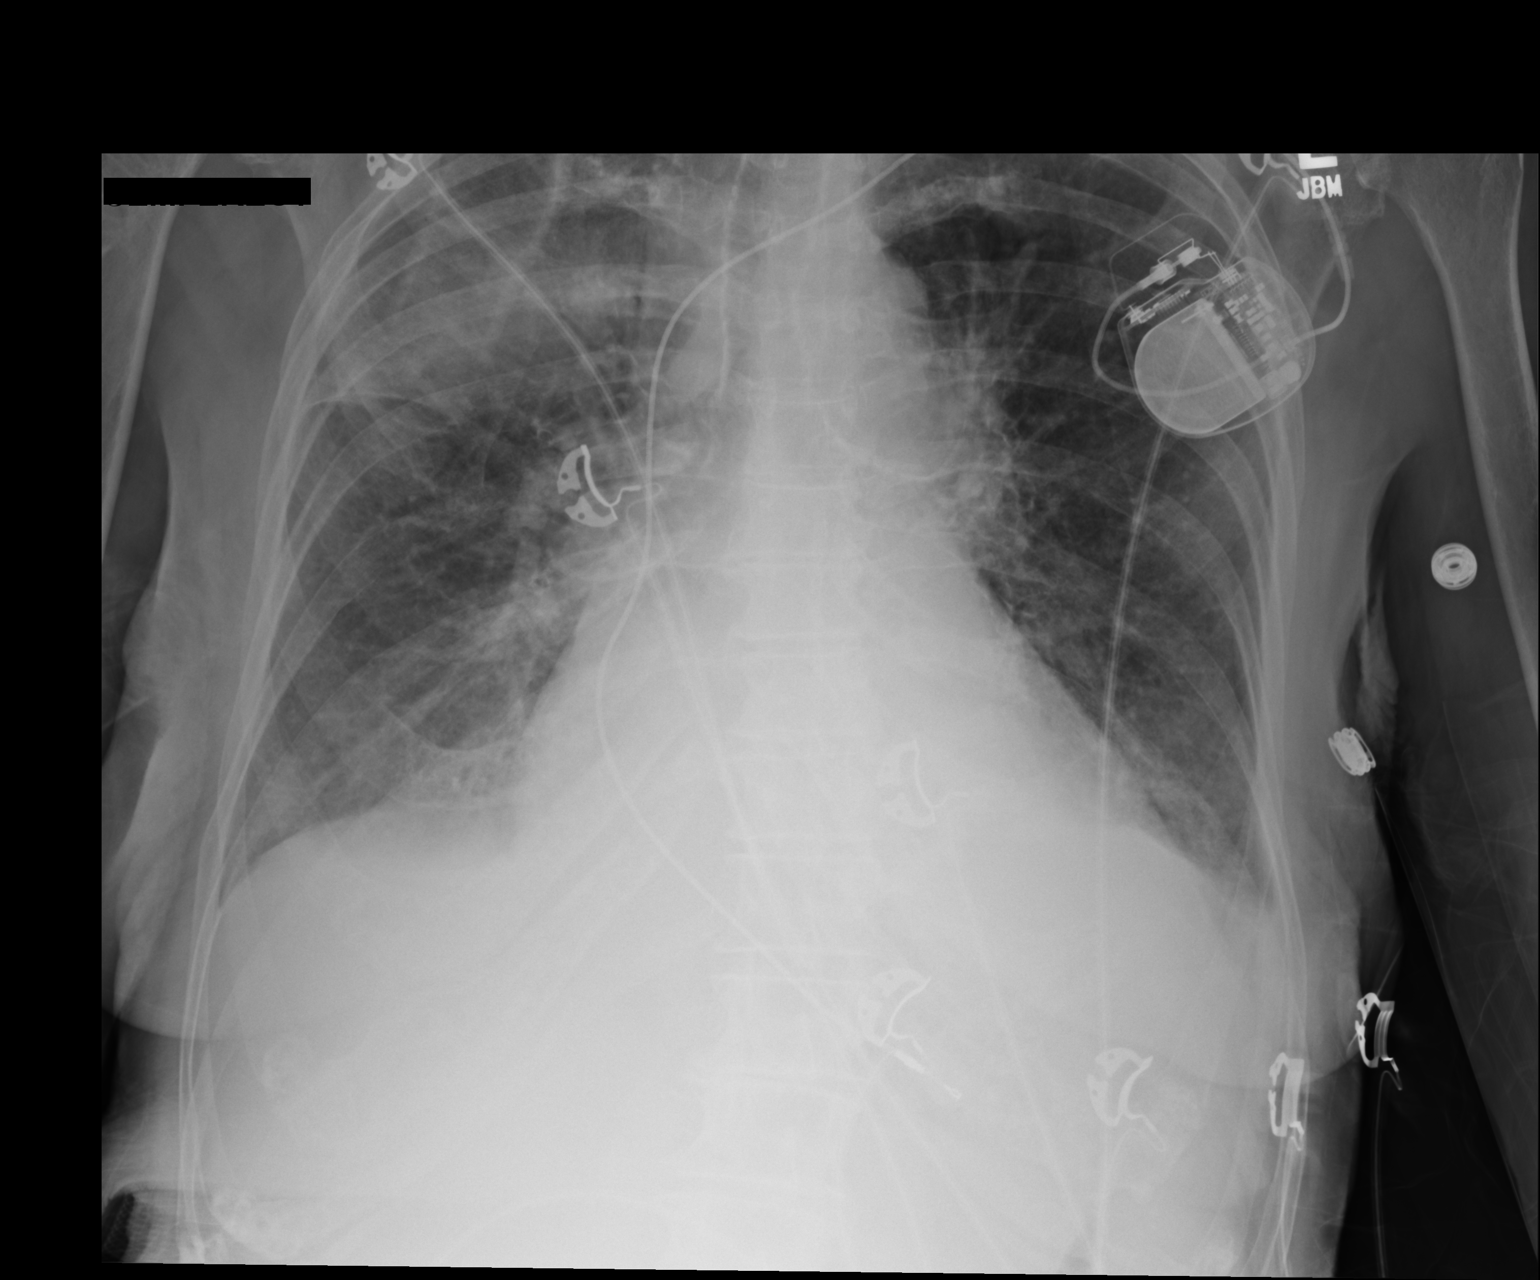

[2 of 2 positions shown; findings below may reference images not displayed]

FINDINGS: Cardiac shadow is mildly enlarged. A pacing device is again seen
stable. New increased density is noted in the right upper lobe
consistent with acute pneumonia. Mild left basilar atelectasis is
noted. No bony abnormality is seen.
IMPRESSION: Acute right upper lobe pneumonia. Followup PA and lateral chest
X-ray is recommended in 3-4 weeks following trial of antibiotic
therapy to ensure resolution and exclude underlying malignancy.

## 2017-06-16 IMAGING — CR DG CHEST 2V
2 series · 2 of 2 positions shown · non-contrast
Comparison: 08/30/2015

CLINICAL DATA: Pneumonia

EXAM:
CHEST  2 VIEW

[chest pa]
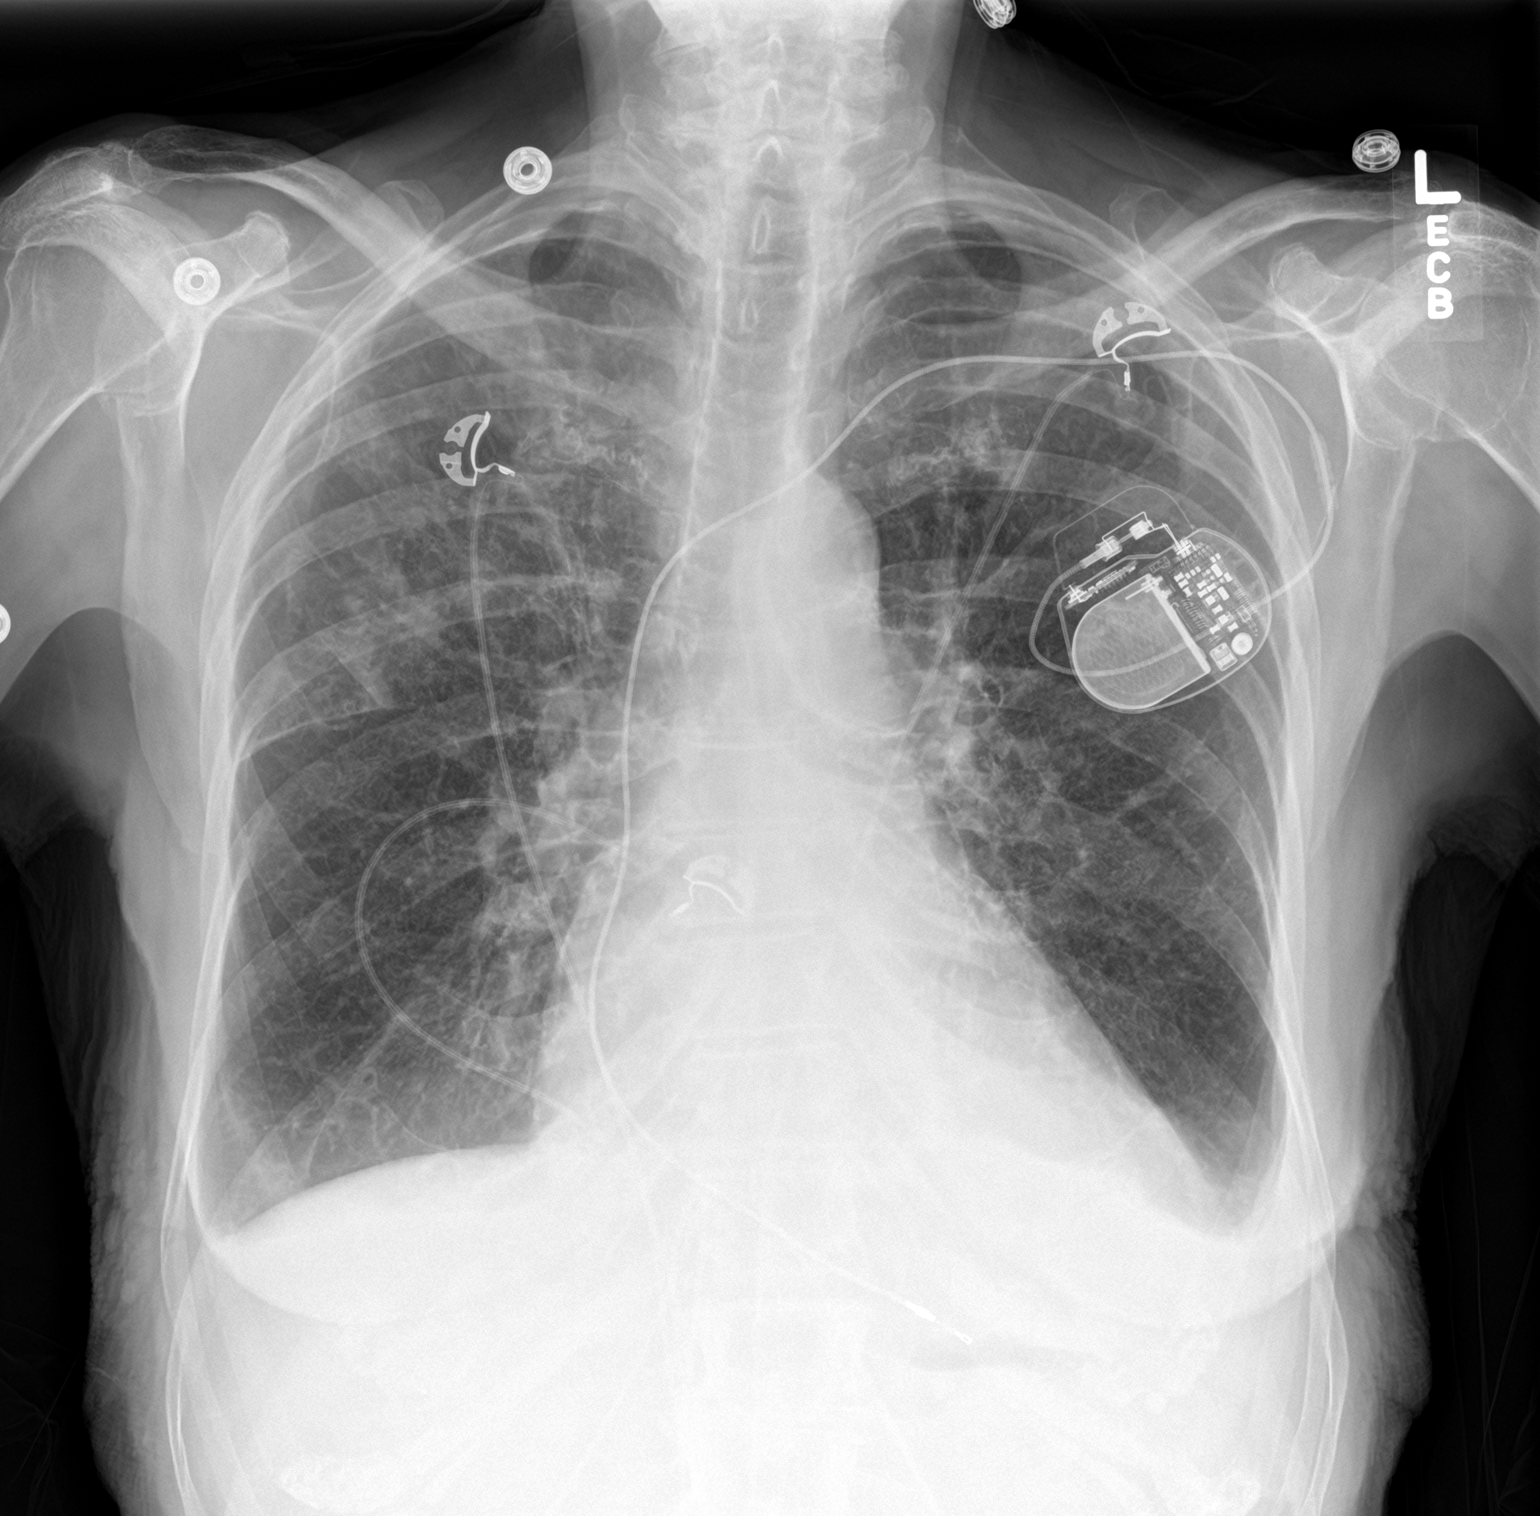

[chest lat]
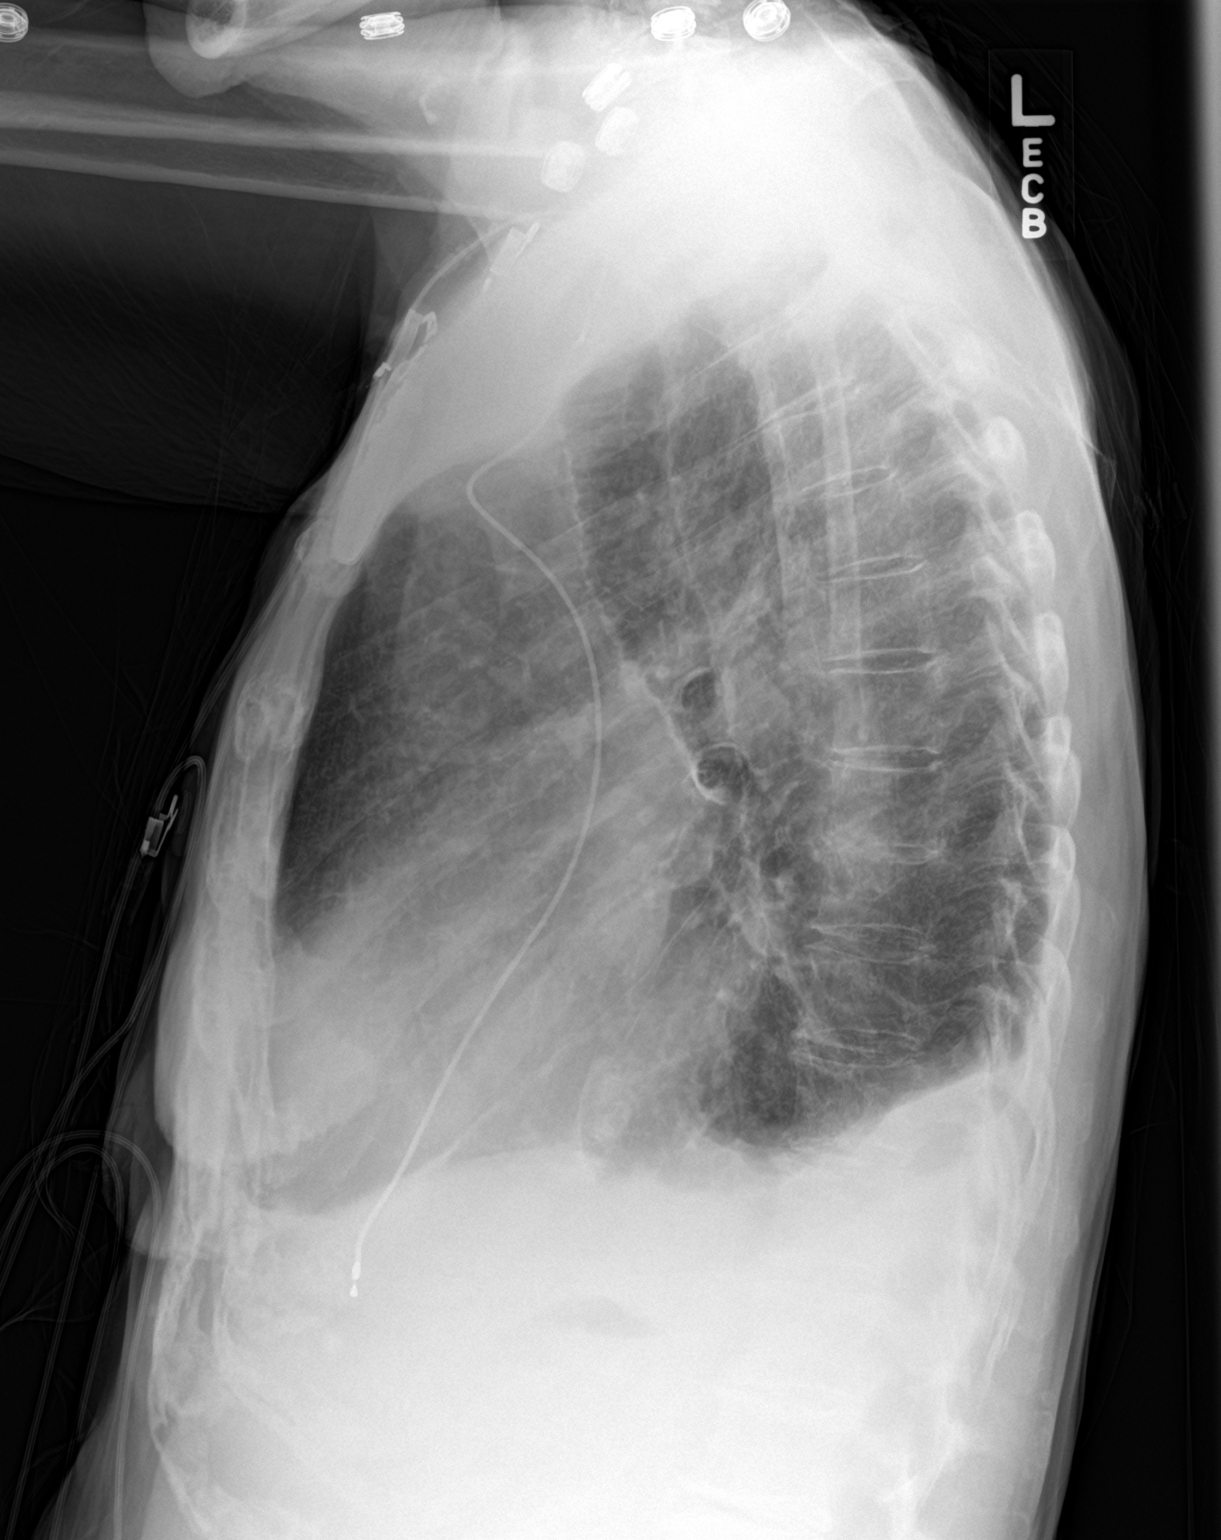

[2 of 2 positions shown; findings below may reference images not displayed]

FINDINGS: There is hyperinflation of the lungs compatible with COPD.
Cardiomegaly. Small bilateral pleural effusions. Right upper lobe
airspace disease concerning for pneumonia. Probable scarring in the
left upper lobe. Left pacer in place, unchanged. Low
IMPRESSION: Cardiomegaly, COPD.

Right upper lobe airspace disease concerning for pneumonia.

Small bilateral pleural effusions.

## 2017-07-02 ENCOUNTER — Ambulatory Visit (INDEPENDENT_AMBULATORY_CARE_PROVIDER_SITE_OTHER): Payer: Medicare Other | Admitting: *Deleted

## 2017-07-02 DIAGNOSIS — I495 Sick sinus syndrome: Secondary | ICD-10-CM | POA: Diagnosis not present

## 2017-07-02 NOTE — Progress Notes (Signed)
Remote pacemaker transmission.   

## 2017-07-03 ENCOUNTER — Encounter: Payer: Self-pay | Admitting: Cardiology

## 2017-07-07 DIAGNOSIS — Z7901 Long term (current) use of anticoagulants: Secondary | ICD-10-CM | POA: Diagnosis not present

## 2017-07-07 DIAGNOSIS — Z79899 Other long term (current) drug therapy: Secondary | ICD-10-CM | POA: Diagnosis not present

## 2017-07-07 DIAGNOSIS — I4891 Unspecified atrial fibrillation: Secondary | ICD-10-CM | POA: Diagnosis not present

## 2017-07-15 LAB — CUP PACEART REMOTE DEVICE CHECK
Battery Remaining Percentage: 95.5 %
Battery Voltage: 2.96 V
Implantable Lead Implant Date: 20120529
Implantable Lead Location: 753860
Implantable Pulse Generator Implant Date: 20120529
Lead Channel Impedance Value: 580 Ohm
Lead Channel Pacing Threshold Amplitude: 0.75 V
Lead Channel Pacing Threshold Pulse Width: 0.4 ms
Lead Channel Setting Pacing Pulse Width: 0.4 ms
Lead Channel Setting Sensing Sensitivity: 5 mV
MDC IDC MSMT BATTERY REMAINING LONGEVITY: 138 mo
MDC IDC MSMT LEADCHNL RV SENSING INTR AMPL: 12 mV
MDC IDC PG SERIAL: 7222573
MDC IDC SESS DTM: 20181226075159
MDC IDC SET LEADCHNL RV PACING AMPLITUDE: 2.5 V
MDC IDC STAT BRADY RV PERCENT PACED: 71 %
Pulse Gen Model: 1210

## 2017-08-04 DIAGNOSIS — I10 Essential (primary) hypertension: Secondary | ICD-10-CM | POA: Diagnosis not present

## 2017-08-04 DIAGNOSIS — I4891 Unspecified atrial fibrillation: Secondary | ICD-10-CM | POA: Diagnosis not present

## 2017-08-04 DIAGNOSIS — K219 Gastro-esophageal reflux disease without esophagitis: Secondary | ICD-10-CM | POA: Diagnosis not present

## 2017-08-04 DIAGNOSIS — K5909 Other constipation: Secondary | ICD-10-CM | POA: Diagnosis not present

## 2017-08-04 DIAGNOSIS — Z1389 Encounter for screening for other disorder: Secondary | ICD-10-CM | POA: Diagnosis not present

## 2017-08-04 DIAGNOSIS — Z6822 Body mass index (BMI) 22.0-22.9, adult: Secondary | ICD-10-CM | POA: Diagnosis not present

## 2017-08-04 DIAGNOSIS — I5189 Other ill-defined heart diseases: Secondary | ICD-10-CM | POA: Diagnosis not present

## 2017-08-04 DIAGNOSIS — G255 Other chorea: Secondary | ICD-10-CM | POA: Diagnosis not present

## 2017-08-04 DIAGNOSIS — Z7901 Long term (current) use of anticoagulants: Secondary | ICD-10-CM | POA: Diagnosis not present

## 2017-08-04 DIAGNOSIS — R413 Other amnesia: Secondary | ICD-10-CM | POA: Diagnosis not present

## 2017-09-01 DIAGNOSIS — J302 Other seasonal allergic rhinitis: Secondary | ICD-10-CM | POA: Diagnosis not present

## 2017-09-01 DIAGNOSIS — I4891 Unspecified atrial fibrillation: Secondary | ICD-10-CM | POA: Diagnosis not present

## 2017-09-01 DIAGNOSIS — Z7901 Long term (current) use of anticoagulants: Secondary | ICD-10-CM | POA: Diagnosis not present

## 2017-09-29 DIAGNOSIS — I4891 Unspecified atrial fibrillation: Secondary | ICD-10-CM | POA: Diagnosis not present

## 2017-09-29 DIAGNOSIS — Z7901 Long term (current) use of anticoagulants: Secondary | ICD-10-CM | POA: Diagnosis not present

## 2017-10-01 ENCOUNTER — Ambulatory Visit (INDEPENDENT_AMBULATORY_CARE_PROVIDER_SITE_OTHER): Payer: Medicare Other | Admitting: *Deleted

## 2017-10-01 DIAGNOSIS — I495 Sick sinus syndrome: Secondary | ICD-10-CM

## 2017-10-01 NOTE — Progress Notes (Signed)
Remote pacemaker transmission.   

## 2017-10-06 ENCOUNTER — Encounter: Payer: Self-pay | Admitting: Cardiology

## 2017-10-11 LAB — CUP PACEART REMOTE DEVICE CHECK
Implantable Lead Implant Date: 20120529
Implantable Lead Location: 753860
Implantable Lead Model: 1948
Implantable Pulse Generator Implant Date: 20120529
MDC IDC PG SERIAL: 7222573
MDC IDC SESS DTM: 20190406215635
Pulse Gen Model: 1210

## 2017-10-13 DIAGNOSIS — M25561 Pain in right knee: Secondary | ICD-10-CM | POA: Diagnosis not present

## 2017-10-13 DIAGNOSIS — Z7901 Long term (current) use of anticoagulants: Secondary | ICD-10-CM | POA: Diagnosis not present

## 2017-10-13 DIAGNOSIS — M1990 Unspecified osteoarthritis, unspecified site: Secondary | ICD-10-CM | POA: Diagnosis not present

## 2017-10-13 DIAGNOSIS — I4891 Unspecified atrial fibrillation: Secondary | ICD-10-CM | POA: Diagnosis not present

## 2017-10-20 DIAGNOSIS — M1711 Unilateral primary osteoarthritis, right knee: Secondary | ICD-10-CM | POA: Diagnosis not present

## 2017-10-20 DIAGNOSIS — M25561 Pain in right knee: Secondary | ICD-10-CM | POA: Diagnosis not present

## 2017-11-03 DIAGNOSIS — Z7901 Long term (current) use of anticoagulants: Secondary | ICD-10-CM | POA: Diagnosis not present

## 2017-11-03 DIAGNOSIS — W19XXXA Unspecified fall, initial encounter: Secondary | ICD-10-CM | POA: Diagnosis not present

## 2017-11-03 DIAGNOSIS — H05232 Hemorrhage of left orbit: Secondary | ICD-10-CM | POA: Diagnosis not present

## 2017-11-03 DIAGNOSIS — I4891 Unspecified atrial fibrillation: Secondary | ICD-10-CM | POA: Diagnosis not present

## 2017-11-24 DIAGNOSIS — Z7901 Long term (current) use of anticoagulants: Secondary | ICD-10-CM | POA: Diagnosis not present

## 2017-11-24 DIAGNOSIS — I4891 Unspecified atrial fibrillation: Secondary | ICD-10-CM | POA: Diagnosis not present

## 2017-12-17 DIAGNOSIS — M1711 Unilateral primary osteoarthritis, right knee: Secondary | ICD-10-CM | POA: Diagnosis not present

## 2017-12-22 DIAGNOSIS — M25561 Pain in right knee: Secondary | ICD-10-CM | POA: Diagnosis not present

## 2017-12-22 DIAGNOSIS — Z6822 Body mass index (BMI) 22.0-22.9, adult: Secondary | ICD-10-CM | POA: Diagnosis not present

## 2017-12-22 DIAGNOSIS — Z7901 Long term (current) use of anticoagulants: Secondary | ICD-10-CM | POA: Diagnosis not present

## 2017-12-22 DIAGNOSIS — R634 Abnormal weight loss: Secondary | ICD-10-CM | POA: Diagnosis not present

## 2017-12-22 DIAGNOSIS — I4891 Unspecified atrial fibrillation: Secondary | ICD-10-CM | POA: Diagnosis not present

## 2017-12-24 DIAGNOSIS — M1711 Unilateral primary osteoarthritis, right knee: Secondary | ICD-10-CM | POA: Diagnosis not present

## 2017-12-31 ENCOUNTER — Ambulatory Visit (INDEPENDENT_AMBULATORY_CARE_PROVIDER_SITE_OTHER): Payer: Medicare Other | Admitting: *Deleted

## 2017-12-31 DIAGNOSIS — I495 Sick sinus syndrome: Secondary | ICD-10-CM | POA: Diagnosis not present

## 2017-12-31 DIAGNOSIS — M1711 Unilateral primary osteoarthritis, right knee: Secondary | ICD-10-CM | POA: Diagnosis not present

## 2017-12-31 DIAGNOSIS — M25561 Pain in right knee: Secondary | ICD-10-CM | POA: Diagnosis not present

## 2017-12-31 NOTE — Progress Notes (Signed)
Remote pacemaker transmission.   

## 2018-01-01 ENCOUNTER — Encounter: Payer: Self-pay | Admitting: Cardiology

## 2018-01-04 LAB — CUP PACEART REMOTE DEVICE CHECK
Implantable Lead Implant Date: 20120529
Implantable Lead Location: 753860
Implantable Lead Model: 1948
Implantable Pulse Generator Implant Date: 20120529
MDC IDC SESS DTM: 20190630140316
Pulse Gen Serial Number: 7222573

## 2018-01-06 NOTE — Progress Notes (Signed)
Electrophysiology Office Note Date: 01/07/2018  ID:  Susan [name], DOB 1920/10/28, MRN [id number]  PCP: Chilton Greathouse, MD Primary Cardiologist: [place] Electrophysiologist: [place]  CC: Pacemaker follow-up  Susan [name] is a 84 y.o. female seen today for Dr Johney Frame.  She presents today for routine electrophysiology followup.  Since last being seen in our clinic, the patient reports doing very well.  She denies chest pain, palpitations, dyspnea, PND, orthopnea, nausea, vomiting, dizziness, syncope, edema, weight gain, or early satiety.  Device History: STJ single chamber PPM implanted 2012 for tachy brady    Past Medical History:  Diagnosis Date  . Anemia   . Anticoagulant long-term use   . Asthma   . Atrial fibrillation (HCC)    permanent  . Chronic diastolic CHF (congestive heart failure) (HCC)   . Chronic kidney disease    Stage 2 to 3  . Coronary artery disease   . Dementia    mild  . Flu 08/2015  . Gallstones   . GERD (gastroesophageal reflux disease)   . Hypertension   . Osteoarthritis   . Pacemaker May 2012   VVI per Dr. Johney Frame  . Stroke [facility])    Remote R cerebellar stroke with gait abnormaility  . Tachy-brady syndrome (HCC)   . Valvular heart disease    Mitral and tricuspid insufficiency   Past Surgical History:  Procedure Laterality Date  . ABDOMINAL HYSTERECTOMY     total  . APPENDECTOMY    . CATARACT EXTRACTION, BILATERAL    . CHOLECYSTECTOMY    . HEMORRHOID SURGERY    . PACEMAKER INSERTION  May 2012   St Jude Accent SR RF implant. Dr. Johney Frame  . TOTAL KNEE ARTHROPLASTY      Current Outpatient Medications  Medication Sig Dispense Refill  . amLODipine (NORVASC) 2.5 MG tablet Take 1 tablet (2.5 mg total) by mouth daily. 90 tablet 3  . Calcium Carbonate (CALTRATE 600 PO) Take 1 capsule by mouth 2 (two) times daily.     . Cyanocobalamin (VITAMIN B-12 PO) Take 1 capsule by mouth daily as needed (takes occasionally).     Marland Kitchen dexlansoprazole  (DEXILANT) 60 MG capsule Take 60 mg by mouth daily.    . digoxin (LANOXIN) 0.125 MG tablet Take 1 tablet (0.125 mg total) by mouth daily. 90 tablet 1  . docusate sodium (COLACE) 100 MG capsule Take 100 mg by mouth at bedtime.     . donepezil (ARICEPT) 10 MG tablet Take 10 mg by mouth at bedtime.      . fish oil-omega-3 fatty acids 1000 MG capsule Take 1 g by mouth daily.     . Fluticasone-Salmeterol (ADVAIR DISKUS) 250-50 MCG/DOSE AEPB Inhale 1 puff into the lungs every 12 (twelve) hours.      . furosemide (LASIX) 20 MG tablet Take 2 tablets (40 mg total) by mouth daily. Pt can take 1 extra tablet by mouth daily as needed for weight gain 30 tablet 0  . Garlic 100 MG TABS Take 1 tablet by mouth daily.     Marland Kitchen HYDROcodone-acetaminophen (NORCO) 10-325 MG per tablet Take 1 tablet by mouth every 6 (six) hours as needed (pain).     . metoprolol succinate (TOPROL XL) 50 MG 24 hr tablet Take 1 tablet (50 mg total) by mouth daily. Take with or immediately following a meal. 30 tablet 3  . montelukast (SINGULAIR) 10 MG tablet Take 10 mg by mouth at bedtime as needed (for breathing).     . Multiple  Vitamins-Minerals (OCUVITE ADULT 50+ PO) Take 1 capsule by mouth daily.     Marland Kitchen NAMENDA XR 28 MG CP24 24 hr capsule Take 28 mg by mouth daily.  6  . Probiotic Product (ALIGN) 4 MG CAPS Take 4 mg by mouth daily.    . QUEtiapine (SEROQUEL) 25 MG tablet Take 50 mg by mouth 2 (two) times daily.     Marland Kitchen warfarin (COUMADIN) 5 MG tablet Take 5-7.5 mg by mouth daily at 6 PM. 7.5mg  on Mon, Wed and Fri  5mg  all other days     No current facility-administered medications for this visit.     Allergies:   Prednisone; Sulfa antibiotics; and Amoxicillin   Social History: Social History   Socioeconomic History  . Marital status: Widowed    Spouse name: Not on file  . Number of children: Not on file  . Years of education: Not on file  . Highest education level: Not on file  Occupational History  . Not on file  Social Needs   . Financial resource strain: Not on file  . Food insecurity:    Worry: Not on file    Inability: Not on file  . Transportation needs:    Medical: Not on file    Non-medical: Not on file  Tobacco Use  . Smoking status: Never Smoker  . Smokeless tobacco: Never Used  Substance and Sexual Activity  . Alcohol use: No  . Drug use: No  . Sexual activity: Not on file  Lifestyle  . Physical activity:    Days per week: Not on file    Minutes per session: Not on file  . Stress: Not on file  Relationships  . Social connections:    Talks on phone: Not on file    Gets together: Not on file    Attends religious service: Not on file    Active member of club or organization: Not on file    Attends meetings of clubs or organizations: Not on file    Relationship status: Not on file  . Intimate partner violence:    Fear of current or ex partner: Not on file    Emotionally abused: Not on file    Physically abused: Not on file    Forced sexual activity: Not on file  Other Topics Concern  . Not on file  Social History Narrative   Pt lives in [place] with daughter.  Attends [employer]   Retired from an [employer].    Family History: Family History  Problem Relation Age of Onset  . Clotting disorder Mother   . Suicidality Father      Review of Systems: All other systems reviewed and are otherwise negative except as noted above.   Physical Exam: VS:  BP (!) 146/81   Pulse (!) 52   Ht 5\' 6"  (1.676 m)   BMI 21.65 kg/m  , BMI Body mass index is 21.65 kg/m.  GEN- The patient is well appearing, alert and oriented x 3 today.   HEENT: normocephalic, atraumatic; sclera clear, conjunctiva pink; hearing intact; oropharynx clear; neck supple  Lungs- Clear to ausculation bilaterally, normal work of breathing.  No wheezes, rales, rhonchi Heart- Regular rate and rhythm, no murmurs, rubs or gallops  GI- soft, non-tender, non-distended, bowel sounds present    Extremities- no clubbing, cyanosis, or edema  MS- no significant deformity or atrophy Skin- warm and dry, no rash or lesion; PPM pocket well healed Psych- euthymic mood, full affect Neuro- strength  and sensation are intact  PPM Interrogation- reviewed in detail today,  See PACEART report  EKG:  EKG is not ordered today.  Recent Labs: No results found for requested labs within last 8760 hours.   Wt Readings from Last 3 Encounters:  01/01/17 134 lb 2 oz (60.8 kg)  12/20/15 125 lb (56.7 kg)  11/30/15 131 lb (59.4 kg)    Assessment and Plan:  1.  Tachy brady syndrome Normal PPM function See Pace Art report No changes today  2.  Permanent atrial fibrillation V rates controlled Continue Warfarin for CHADS2VASC of 6.  INR and CBC followed by PCP No bleeding issues  3.  HTN Stable No change required today    Current medicines are reviewed at length with the patient today.   The patient does not have concerns regarding her medicines.  The following changes were made today:  none  Labs/ tests ordered today include: none No orders of the defined types were placed in this encounter.    Disposition:   Follow up with me in 1 year     Signed, Gypsy BalsamAmber Seiler, NP 01/07/2018 12:47 PM  [facility] [address] [place] [address] [phone] (office) [phone] (fax)

## 2018-01-07 ENCOUNTER — Ambulatory Visit (INDEPENDENT_AMBULATORY_CARE_PROVIDER_SITE_OTHER): Payer: Medicare Other | Admitting: Nurse Practitioner

## 2018-01-07 ENCOUNTER — Encounter: Payer: Self-pay | Admitting: Nurse Practitioner

## 2018-01-07 ENCOUNTER — Encounter (INDEPENDENT_AMBULATORY_CARE_PROVIDER_SITE_OTHER): Payer: Self-pay

## 2018-01-07 VITALS — BP 146/81 | HR 52 | Ht 66.0 in

## 2018-01-07 DIAGNOSIS — I482 Chronic atrial fibrillation: Secondary | ICD-10-CM

## 2018-01-07 DIAGNOSIS — I10 Essential (primary) hypertension: Secondary | ICD-10-CM

## 2018-01-07 DIAGNOSIS — I495 Sick sinus syndrome: Secondary | ICD-10-CM | POA: Diagnosis not present

## 2018-01-07 DIAGNOSIS — I4821 Permanent atrial fibrillation: Secondary | ICD-10-CM

## 2018-01-07 NOTE — Patient Instructions (Addendum)
Medication Instructions:   Your physician recommends that you continue on your current medications as directed. Please refer to the Current Medication list given to you today.   If you need a refill on your cardiac medications before your next appointment, please call your pharmacy.  Labwork: NONE ORDERED  TODAY    Testing/Procedures: NONE ORDERED  TODAY    Follow-Up:  Your physician wants you to follow-up in: ONE YEAR WITH SEILER  You will receive a reminder letter in the mail two months in advance. If you don't receive a letter, please call our office to schedule the follow-up appointment.  Remote monitoring is used to monitor your Pacemaker of ICD from home. This monitoring reduces the number of office visits required to check your device to one time per year. It allows us to keep an eye on the functioning of your device to ensure it is working properly. You are scheduled for a device check from home on . 9-25-19You may send your transmission at any time that day. If you have a wireless device, the transmission will be sent automatically. After your physician reviews your transmission, you will receive a postcard with your next transmission date.       Any Other Special Instructions Will Be Listed Below (If Applicable).                                                                                                                                                  '

## 2018-01-14 LAB — CUP PACEART INCLINIC DEVICE CHECK
Date Time Interrogation Session: 20190710124358
Implantable Lead Location: 753860
Implantable Lead Model: 1948
MDC IDC LEAD IMPLANT DT: 20120529
MDC IDC PG IMPLANT DT: 20120529
MDC IDC PG SERIAL: 7222573

## 2018-01-19 DIAGNOSIS — Z7901 Long term (current) use of anticoagulants: Secondary | ICD-10-CM | POA: Diagnosis not present

## 2018-01-19 DIAGNOSIS — I4891 Unspecified atrial fibrillation: Secondary | ICD-10-CM | POA: Diagnosis not present

## 2018-01-19 DIAGNOSIS — R634 Abnormal weight loss: Secondary | ICD-10-CM | POA: Diagnosis not present

## 2018-02-09 DIAGNOSIS — I482 Chronic atrial fibrillation: Secondary | ICD-10-CM | POA: Diagnosis not present

## 2018-02-09 DIAGNOSIS — I10 Essential (primary) hypertension: Secondary | ICD-10-CM | POA: Diagnosis not present

## 2018-02-09 DIAGNOSIS — G255 Other chorea: Secondary | ICD-10-CM | POA: Diagnosis not present

## 2018-02-09 DIAGNOSIS — R413 Other amnesia: Secondary | ICD-10-CM | POA: Diagnosis not present

## 2018-02-09 DIAGNOSIS — K219 Gastro-esophageal reflux disease without esophagitis: Secondary | ICD-10-CM | POA: Diagnosis not present

## 2018-02-09 DIAGNOSIS — Z7901 Long term (current) use of anticoagulants: Secondary | ICD-10-CM | POA: Diagnosis not present

## 2018-02-09 DIAGNOSIS — I5189 Other ill-defined heart diseases: Secondary | ICD-10-CM | POA: Diagnosis not present

## 2018-02-09 DIAGNOSIS — Z950 Presence of cardiac pacemaker: Secondary | ICD-10-CM | POA: Diagnosis not present

## 2018-02-09 DIAGNOSIS — D538 Other specified nutritional anemias: Secondary | ICD-10-CM | POA: Diagnosis not present

## 2018-02-09 DIAGNOSIS — Z6822 Body mass index (BMI) 22.0-22.9, adult: Secondary | ICD-10-CM | POA: Diagnosis not present

## 2018-02-16 DIAGNOSIS — M1711 Unilateral primary osteoarthritis, right knee: Secondary | ICD-10-CM | POA: Diagnosis not present

## 2018-02-16 DIAGNOSIS — M25561 Pain in right knee: Secondary | ICD-10-CM | POA: Diagnosis not present

## 2018-02-23 DIAGNOSIS — I4891 Unspecified atrial fibrillation: Secondary | ICD-10-CM | POA: Diagnosis not present

## 2018-02-23 DIAGNOSIS — I10 Essential (primary) hypertension: Secondary | ICD-10-CM | POA: Diagnosis not present

## 2018-02-23 DIAGNOSIS — Z7901 Long term (current) use of anticoagulants: Secondary | ICD-10-CM | POA: Diagnosis not present

## 2018-03-30 DIAGNOSIS — I482 Chronic atrial fibrillation: Secondary | ICD-10-CM | POA: Diagnosis not present

## 2018-03-30 DIAGNOSIS — R634 Abnormal weight loss: Secondary | ICD-10-CM | POA: Diagnosis not present

## 2018-03-30 DIAGNOSIS — Z7901 Long term (current) use of anticoagulants: Secondary | ICD-10-CM | POA: Diagnosis not present

## 2018-04-01 ENCOUNTER — Ambulatory Visit (INDEPENDENT_AMBULATORY_CARE_PROVIDER_SITE_OTHER): Payer: Medicare Other | Admitting: *Deleted

## 2018-04-01 DIAGNOSIS — I495 Sick sinus syndrome: Secondary | ICD-10-CM

## 2018-04-01 NOTE — Progress Notes (Signed)
Remote pacemaker transmission.   

## 2018-04-02 ENCOUNTER — Encounter: Payer: Self-pay | Admitting: Cardiology

## 2018-04-06 DIAGNOSIS — Z23 Encounter for immunization: Secondary | ICD-10-CM | POA: Diagnosis not present

## 2018-04-09 DIAGNOSIS — H4720 Unspecified optic atrophy: Secondary | ICD-10-CM | POA: Diagnosis not present

## 2018-04-09 DIAGNOSIS — H02102 Unspecified ectropion of right lower eyelid: Secondary | ICD-10-CM | POA: Diagnosis not present

## 2018-04-09 DIAGNOSIS — H524 Presbyopia: Secondary | ICD-10-CM | POA: Diagnosis not present

## 2018-04-09 DIAGNOSIS — Z961 Presence of intraocular lens: Secondary | ICD-10-CM | POA: Diagnosis not present

## 2018-04-27 DIAGNOSIS — I482 Chronic atrial fibrillation, unspecified: Secondary | ICD-10-CM | POA: Diagnosis not present

## 2018-04-27 DIAGNOSIS — Z7901 Long term (current) use of anticoagulants: Secondary | ICD-10-CM | POA: Diagnosis not present

## 2018-04-30 LAB — CUP PACEART REMOTE DEVICE CHECK
Brady Statistic RV Percent Paced: 90 %
Date Time Interrogation Session: 20190924205607
Lead Channel Impedance Value: 550 Ohm
Lead Channel Pacing Threshold Amplitude: 1.25 V
Lead Channel Setting Sensing Sensitivity: 5 mV
MDC IDC LEAD IMPLANT DT: 20120529
MDC IDC LEAD LOCATION: 753860
MDC IDC MSMT BATTERY REMAINING LONGEVITY: 137 mo
MDC IDC MSMT BATTERY REMAINING PERCENTAGE: 95.5 %
MDC IDC MSMT BATTERY VOLTAGE: 2.96 V
MDC IDC MSMT LEADCHNL RV PACING THRESHOLD PULSEWIDTH: 0.4 ms
MDC IDC MSMT LEADCHNL RV SENSING INTR AMPL: 12 mV
MDC IDC PG IMPLANT DT: 20120529
MDC IDC PG SERIAL: 7222573
MDC IDC SET LEADCHNL RV PACING AMPLITUDE: 2.5 V
MDC IDC SET LEADCHNL RV PACING PULSEWIDTH: 0.4 ms
Pulse Gen Model: 1210

## 2018-05-25 DIAGNOSIS — I4891 Unspecified atrial fibrillation: Secondary | ICD-10-CM | POA: Diagnosis not present

## 2018-05-25 DIAGNOSIS — Z7901 Long term (current) use of anticoagulants: Secondary | ICD-10-CM | POA: Diagnosis not present

## 2018-06-22 DIAGNOSIS — Z7901 Long term (current) use of anticoagulants: Secondary | ICD-10-CM | POA: Diagnosis not present

## 2018-06-22 DIAGNOSIS — I4891 Unspecified atrial fibrillation: Secondary | ICD-10-CM | POA: Diagnosis not present

## 2018-07-02 ENCOUNTER — Ambulatory Visit (INDEPENDENT_AMBULATORY_CARE_PROVIDER_SITE_OTHER): Payer: Medicare Other

## 2018-07-02 DIAGNOSIS — I495 Sick sinus syndrome: Secondary | ICD-10-CM

## 2018-07-02 NOTE — Progress Notes (Signed)
Remote pacemaker transmission.   

## 2018-07-04 LAB — CUP PACEART REMOTE DEVICE CHECK
Battery Remaining Longevity: 137 mo
Battery Remaining Percentage: 95.5 %
Battery Voltage: 2.96 V
Brady Statistic RV Percent Paced: 87 %
Date Time Interrogation Session: 20191225080717
Implantable Lead Model: 1948
Lead Channel Impedance Value: 560 Ohm
Lead Channel Pacing Threshold Amplitude: 1.25 V
Lead Channel Setting Pacing Amplitude: 2.5 V
Lead Channel Setting Pacing Pulse Width: 0.4 ms
Lead Channel Setting Sensing Sensitivity: 5 mV
MDC IDC LEAD IMPLANT DT: 20120529
MDC IDC LEAD LOCATION: 753860
MDC IDC MSMT LEADCHNL RV PACING THRESHOLD PULSEWIDTH: 0.4 ms
MDC IDC MSMT LEADCHNL RV SENSING INTR AMPL: 12 mV
MDC IDC PG IMPLANT DT: 20120529
MDC IDC PG SERIAL: 7222573
Pulse Gen Model: 1210

## 2018-07-13 DIAGNOSIS — Z7901 Long term (current) use of anticoagulants: Secondary | ICD-10-CM | POA: Diagnosis not present

## 2018-07-13 DIAGNOSIS — I4891 Unspecified atrial fibrillation: Secondary | ICD-10-CM | POA: Diagnosis not present

## 2018-08-10 DIAGNOSIS — I4891 Unspecified atrial fibrillation: Secondary | ICD-10-CM | POA: Diagnosis not present

## 2018-08-10 DIAGNOSIS — Z7901 Long term (current) use of anticoagulants: Secondary | ICD-10-CM | POA: Diagnosis not present

## 2018-09-07 DIAGNOSIS — Z7901 Long term (current) use of anticoagulants: Secondary | ICD-10-CM | POA: Diagnosis not present

## 2018-09-07 DIAGNOSIS — I4891 Unspecified atrial fibrillation: Secondary | ICD-10-CM | POA: Diagnosis not present

## 2018-09-07 DIAGNOSIS — Z6821 Body mass index (BMI) 21.0-21.9, adult: Secondary | ICD-10-CM | POA: Diagnosis not present

## 2018-09-14 DIAGNOSIS — H02403 Unspecified ptosis of bilateral eyelids: Secondary | ICD-10-CM | POA: Diagnosis not present

## 2018-09-14 DIAGNOSIS — H02423 Myogenic ptosis of bilateral eyelids: Secondary | ICD-10-CM | POA: Diagnosis not present

## 2018-09-14 DIAGNOSIS — H02132 Senile ectropion of right lower eyelid: Secondary | ICD-10-CM | POA: Diagnosis not present

## 2018-09-14 DIAGNOSIS — H02102 Unspecified ectropion of right lower eyelid: Secondary | ICD-10-CM | POA: Diagnosis not present

## 2018-09-14 DIAGNOSIS — H04223 Epiphora due to insufficient drainage, bilateral lacrimal glands: Secondary | ICD-10-CM | POA: Diagnosis not present

## 2018-09-24 ENCOUNTER — Telehealth: Payer: Self-pay | Admitting: *Deleted

## 2018-09-24 NOTE — Telephone Encounter (Signed)
Patient with diagnosis of afib on warfarin for anticoagulation.    Procedure:  B/L UPPER LID PTOSIS REPAIR BY LEVATOR ADVANCEMENT , RIGHT LOWER EYELID LATERAL TARSAL STRIP. LASER MEDIAL SPINDLE RIGHT EYE  Date of procedure: TBD  CHADS2-VASc score of  7 (CHF, HTN, AGE, DM2, stroke/tia x 2, CAD, AGE, female)  Per office protocol, patient can hold coumadin for 5 days prior to procedure.   Patient will need bridging with Lovenox (enoxaparin) around procedure.  PCP is managing warfarin. Please contact them so they can coordinate Lovenox bridge

## 2018-09-24 NOTE — Telephone Encounter (Signed)
   Primary Cardiologist: Peter [place], MD  Chart reviewed as part of pre-operative protocol coverage. Patient was contacted 09/24/2018 in reference to pre-operative risk assessment for pending surgery as outlined below.  RINDI MENESES was last seen on 01/07/2018 by Gypsy Balsam NP.  Since that day, VERMA JASSO has done well without chest pain or shortness of breath despite her advanced age. Although the biggest risk with surgery is her age, however the surgery itself is a low risk procedure.   Therefore, based on ACC/AHA guidelines, the patient would be at acceptable risk for the planned procedure without further cardiovascular testing.   I will route this recommendation to the requesting party via Epic fax function and remove from pre-op pool.  Please call with questions. I instructed the patient to arrange a visit with her PCP's coumadin clinic 7 days prior to her surgery to arrange Lovenox bridge.  [place], [place] 09/24/2018, 5:13 PM

## 2018-09-24 NOTE — Telephone Encounter (Signed)
New Message ° ° ° °Patient returning phone call  °

## 2018-09-24 NOTE — Telephone Encounter (Signed)
   [facility] Pre-operative Risk Assessment    Request for surgical clearance:  1. What type of surgery is being performed? B/L UPPER LID PTOSIS REPAIR BY LEVATOR ADVANCEMENT , RIGHT LOWER EYELID LATERAL TARSAL STRIP. LASER MEDIAL SPINDLE RIGHT EYE   2. When is this surgery scheduled? TBD   3. What type of clearance is required (medical clearance vs. Pharmacy clearance to hold med vs. Both)? BOTH  4. Are there any medications that need to be held prior to surgery and how long?WARFARIN    5. Practice name and name of physician performing surgery? [facility]; DR. RANJIT-REEVES   6. What is your office phone number [phone]    7.   What is your office fax number [phone]  8.   Anesthesia type (None, local, MAC, general) ? NONE LISTED   Julaine Hua 09/24/2018, 1:14 PM  _________________________________________________________________   (provider comments below)

## 2018-09-24 NOTE — Telephone Encounter (Signed)
Message left for pt to call back to discuss any new clinical changes and holding warfarin with bridging.   This seems to be an elective procedure and will very likely be postponed until and the COVID 19 pandemic is under control.

## 2018-10-01 ENCOUNTER — Ambulatory Visit (INDEPENDENT_AMBULATORY_CARE_PROVIDER_SITE_OTHER): Payer: Medicare Other | Admitting: *Deleted

## 2018-10-01 ENCOUNTER — Other Ambulatory Visit: Payer: Self-pay

## 2018-10-01 DIAGNOSIS — I495 Sick sinus syndrome: Secondary | ICD-10-CM

## 2018-10-01 LAB — CUP PACEART REMOTE DEVICE CHECK
Brady Statistic RV Percent Paced: 88 %
Date Time Interrogation Session: 20200325063853
Implantable Lead Implant Date: 20120529
Implantable Lead Location: 753860
Implantable Lead Model: 1948
Lead Channel Impedance Value: 560 Ohm
Lead Channel Pacing Threshold Pulse Width: 0.4 ms
Lead Channel Sensing Intrinsic Amplitude: 12 mV
MDC IDC MSMT BATTERY REMAINING LONGEVITY: 137 mo
MDC IDC MSMT BATTERY REMAINING PERCENTAGE: 95.5 %
MDC IDC MSMT BATTERY VOLTAGE: 2.96 V
MDC IDC MSMT LEADCHNL RV PACING THRESHOLD AMPLITUDE: 1.25 V
MDC IDC PG IMPLANT DT: 20120529
MDC IDC SET LEADCHNL RV PACING AMPLITUDE: 2.5 V
MDC IDC SET LEADCHNL RV PACING PULSEWIDTH: 0.4 ms
MDC IDC SET LEADCHNL RV SENSING SENSITIVITY: 5 mV
Pulse Gen Model: 1210
Pulse Gen Serial Number: 7222573

## 2018-10-05 DIAGNOSIS — I4891 Unspecified atrial fibrillation: Secondary | ICD-10-CM | POA: Diagnosis not present

## 2018-10-05 DIAGNOSIS — Z7901 Long term (current) use of anticoagulants: Secondary | ICD-10-CM | POA: Diagnosis not present

## 2018-10-06 ENCOUNTER — Encounter: Payer: Self-pay | Admitting: Cardiology

## 2018-10-06 NOTE — Progress Notes (Signed)
Remote pacemaker transmission.   

## 2018-10-19 DIAGNOSIS — Z7901 Long term (current) use of anticoagulants: Secondary | ICD-10-CM | POA: Diagnosis not present

## 2018-10-19 DIAGNOSIS — I4891 Unspecified atrial fibrillation: Secondary | ICD-10-CM | POA: Diagnosis not present

## 2018-11-16 DIAGNOSIS — I4891 Unspecified atrial fibrillation: Secondary | ICD-10-CM | POA: Diagnosis not present

## 2018-11-16 DIAGNOSIS — Z7901 Long term (current) use of anticoagulants: Secondary | ICD-10-CM | POA: Diagnosis not present

## 2018-12-14 DIAGNOSIS — Z7901 Long term (current) use of anticoagulants: Secondary | ICD-10-CM | POA: Diagnosis not present

## 2018-12-14 DIAGNOSIS — I4891 Unspecified atrial fibrillation: Secondary | ICD-10-CM | POA: Diagnosis not present

## 2018-12-23 ENCOUNTER — Telehealth: Payer: Self-pay | Admitting: Cardiology

## 2018-12-23 NOTE — Telephone Encounter (Signed)
Call placed to pt re: surgical clearance and the need for an appt in order for clearance to be given. No answer / no machine. Will attempt later.

## 2018-12-23 NOTE — Telephone Encounter (Signed)
   Primary Cardiologist:Peter Martinique, MD  EP: Dr. Rayann Heman  Chart reviewed as part of pre-operative protocol coverage. Because of Susan [name]'s past medical history and time since last visit, he/she will require a follow-up visit in order to better assess preoperative cardiovascular risk.  Pre-op covering staff: - Please schedule appointment and call patient to inform them. - Please contact requesting surgeon's office via preferred method (i.e, phone, fax) to inform them of need for appointment prior to surgery.  Will ask Dr. Donneta Romberg Dr. Rayann Heman to give anticoagulation recommendations.   [place], [place]  12/23/2018, 10:41 AM

## 2018-12-23 NOTE — Telephone Encounter (Signed)
I have not seen the patient in 3 years so I cannot give recommendations without a visit. She is followed by EP service but if they do not feel Ok to clear her she will need a visit   Wendy Mikles [place] MD, [facility]

## 2018-12-23 NOTE — Telephone Encounter (Signed)
Ok to proceed with low risk procedure without further CV workup if medically indicated.  Chads2vasc score is at least 7 and includes prior stroke. Our recommendation is to bridge with lovenox if possible as per guidelines.  Ultimately, surgeon must decide what is appropriate for the procedure that he performs.  If he feels that anticoagulation must be held, we will defer that decision to his expertise.  Thompson Grayer MD, [facility] 12/23/2018 11:00 PM

## 2018-12-23 NOTE — Telephone Encounter (Signed)
New Message     Marcene Brawn is calling from [facility] about a clearance that was sent back in March. She says the pt was cleared to hold coumadin but wants the pt to take  Lovenox Bridge  The Dr says it still would not be safe  And would like for the pt to Start it after surgery and not before    Please call

## 2018-12-23 NOTE — Telephone Encounter (Signed)
Called and spoke with [place]- she states clearance was given back in march- clearance is in chart already. Almyra Deforest, [place] suggested to stop Coumadin but to do Lovenox bridge. After speaking with the Surgeon who would be doing the surgery- they asked that she not be on any anticoag if possible as it was unsafe and could cause bleeding and cause patient to go blinde- and to do the bridging after the surgery date if possible.  I advised it seems that PCP handles the Coumadin- but I would have to see if this would be okay by a Cardiac stand point. Advised that message would be routed to our preop to advise.

## 2018-12-24 NOTE — Telephone Encounter (Signed)
I agree with Dr. Rayann Heman statement. If surgeon refuses to do surgery (even with washout period of lovenox) and patient is aware of risks off anticoagulation then ok to proceed. Patient PCP will need to coordinate bridge for post procedure as they are the ones managing INR.

## 2018-12-25 NOTE — Telephone Encounter (Signed)
   Primary Cardiologist:Peter Martin[place], MD  Chart reviewed as part of pre-operative protocol coverage. Pre-op clearance already addressed by colleagues in earlier phone notes. To summarize recommendations:  -Per Dr. Rayann Heman: Ok to proceed with low risk procedure without further CV workup if medically indicated.  Chads2vasc score is at least 7 and includes prior stroke. Our recommendation is to bridge with lovenox if possible as per guidelines.  Ultimately, surgeon must decide what is appropriate for the procedure that he performs.  If he feels that anticoagulation must be held, we will defer that decision to his expertise.  Per Marcelle Overlie, RPH:  I agree with Dr. Rayann Heman statement. If surgeon refuses to do surgery (even with washout period of lovenox) and patient is aware of risks off anticoagulation then ok to proceed. Patient PCP will need to coordinate bridge for post procedure as they are the ones managing INR.          Will route this bundled recommendation to requesting provider via Epic fax function. Please call with questions.  Daune Perch, NP 12/25/2018, 5:10 PM

## 2018-12-31 ENCOUNTER — Ambulatory Visit (INDEPENDENT_AMBULATORY_CARE_PROVIDER_SITE_OTHER): Payer: Medicare Other | Admitting: *Deleted

## 2018-12-31 DIAGNOSIS — I495 Sick sinus syndrome: Secondary | ICD-10-CM | POA: Diagnosis not present

## 2018-12-31 LAB — CUP PACEART REMOTE DEVICE CHECK
Battery Remaining Longevity: 127 mo
Battery Remaining Percentage: 91 %
Battery Voltage: 2.95 V
Brady Statistic RV Percent Paced: 90 %
Date Time Interrogation Session: 20200624071331
Implantable Lead Implant Date: 20120529
Implantable Lead Location: 753860
Implantable Lead Model: 1948
Implantable Pulse Generator Implant Date: 20120529
Lead Channel Impedance Value: 560 Ohm
Lead Channel Sensing Intrinsic Amplitude: 12 mV
Lead Channel Setting Pacing Amplitude: 2.5 V
Lead Channel Setting Pacing Pulse Width: 0.4 ms
Lead Channel Setting Sensing Sensitivity: 5 mV
Pulse Gen Model: 1210
Pulse Gen Serial Number: 7222573

## 2019-01-08 ENCOUNTER — Encounter: Payer: Self-pay | Admitting: Cardiology

## 2019-01-08 NOTE — Progress Notes (Signed)
Remote pacemaker transmission.   

## 2019-01-11 DIAGNOSIS — Z7901 Long term (current) use of anticoagulants: Secondary | ICD-10-CM | POA: Diagnosis not present

## 2019-01-11 DIAGNOSIS — I4891 Unspecified atrial fibrillation: Secondary | ICD-10-CM | POA: Diagnosis not present

## 2019-01-25 DIAGNOSIS — I4891 Unspecified atrial fibrillation: Secondary | ICD-10-CM | POA: Diagnosis not present

## 2019-01-25 DIAGNOSIS — Z7901 Long term (current) use of anticoagulants: Secondary | ICD-10-CM | POA: Diagnosis not present

## 2019-02-22 DIAGNOSIS — Z7901 Long term (current) use of anticoagulants: Secondary | ICD-10-CM | POA: Diagnosis not present

## 2019-02-22 DIAGNOSIS — I4891 Unspecified atrial fibrillation: Secondary | ICD-10-CM | POA: Diagnosis not present

## 2019-03-16 DIAGNOSIS — I4891 Unspecified atrial fibrillation: Secondary | ICD-10-CM | POA: Diagnosis not present

## 2019-03-16 DIAGNOSIS — Z7901 Long term (current) use of anticoagulants: Secondary | ICD-10-CM | POA: Diagnosis not present

## 2019-04-01 ENCOUNTER — Ambulatory Visit (INDEPENDENT_AMBULATORY_CARE_PROVIDER_SITE_OTHER): Payer: Medicare Other | Admitting: *Deleted

## 2019-04-01 DIAGNOSIS — I495 Sick sinus syndrome: Secondary | ICD-10-CM

## 2019-04-02 LAB — CUP PACEART REMOTE DEVICE CHECK
Battery Remaining Longevity: 113 mo
Battery Remaining Percentage: 81 %
Battery Voltage: 2.93 V
Brady Statistic RV Percent Paced: 91 %
Date Time Interrogation Session: 20200923075057
Implantable Lead Implant Date: 20120529
Implantable Lead Location: 753860
Implantable Lead Model: 1948
Implantable Pulse Generator Implant Date: 20120529
Lead Channel Impedance Value: 560 Ohm
Lead Channel Pacing Threshold Amplitude: 1.25 V
Lead Channel Pacing Threshold Pulse Width: 0.4 ms
Lead Channel Sensing Intrinsic Amplitude: 12 mV
Lead Channel Setting Pacing Amplitude: 2.5 V
Lead Channel Setting Pacing Pulse Width: 0.4 ms
Lead Channel Setting Sensing Sensitivity: 5 mV
Pulse Gen Model: 1210
Pulse Gen Serial Number: 7222573

## 2019-04-07 ENCOUNTER — Telehealth: Payer: Self-pay | Admitting: *Deleted

## 2019-04-07 NOTE — Telephone Encounter (Signed)
LMOM requesting call back to DC. Direct number given. Will offer next available EP APP f/u appointment for annual visit and RV lead assessment.

## 2019-04-07 NOTE — Telephone Encounter (Signed)
-----   Message from Thompson Grayer, MD sent at 04/05/2019  9:38 PM EDT ----- Remote device check reviewed.   Device report notable for:  Noise noted.  Not dependant but inhibition is noted. Please bring in to see EP APP (overdue for annual visit) to see if noise is reproducible and if we can program around it (with adjustment of sensitivity, etc)

## 2019-04-07 NOTE — Telephone Encounter (Signed)
Follow Up:     Pt is returning Emily's call from today.

## 2019-04-08 ENCOUNTER — Encounter: Payer: Self-pay | Admitting: Cardiology

## 2019-04-08 NOTE — Progress Notes (Signed)
Remote pacemaker transmission.   

## 2019-04-09 NOTE — Telephone Encounter (Signed)
Spoke with patient. Explained need for OV for possible reprogramming. Patient agreeable to appointment with Genene Churn, NP, on 04/23/19 at 11:20am. Family member/friend will need to be present. No further questions at this time.

## 2019-04-12 DIAGNOSIS — Z23 Encounter for immunization: Secondary | ICD-10-CM | POA: Diagnosis not present

## 2019-04-12 DIAGNOSIS — I4891 Unspecified atrial fibrillation: Secondary | ICD-10-CM | POA: Diagnosis not present

## 2019-04-12 DIAGNOSIS — Z7901 Long term (current) use of anticoagulants: Secondary | ICD-10-CM | POA: Diagnosis not present

## 2019-04-15 DIAGNOSIS — H353122 Nonexudative age-related macular degeneration, left eye, intermediate dry stage: Secondary | ICD-10-CM | POA: Diagnosis not present

## 2019-04-15 DIAGNOSIS — H52203 Unspecified astigmatism, bilateral: Secondary | ICD-10-CM | POA: Diagnosis not present

## 2019-04-15 DIAGNOSIS — Z961 Presence of intraocular lens: Secondary | ICD-10-CM | POA: Diagnosis not present

## 2019-04-22 NOTE — Progress Notes (Signed)
Electrophysiology Office Note Date: 04/25/2019  ID:  Susan CaveMarie A Erven, DOB 12/25/1920, MRN [id number]  PCP: Chilton GreathouseAvva, Ravisankar, MD Primary Cardiologist: [place] Electrophysiologist: [place]  CC: Pacemaker follow-up  [name] is a 85 y.o. female seen today for Dr Johney FrameAllred.  She presents today for routine electrophysiology followup.  Since last being seen in our clinic, the patient reports doing very well.  She denies chest pain, palpitations, dyspnea, PND, orthopnea, nausea, vomiting, dizziness, syncope, edema, weight gain, or early satiety.  Device History: STJ single chamber PPM implanted 2012 for tachy brady   Past Medical History:  Diagnosis Date  . Anemia   . Anticoagulant long-term use   . Asthma   . Atrial fibrillation (HCC)    permanent  . Chronic diastolic CHF (congestive heart failure) (HCC)   . Chronic kidney disease    Stage 2 to 3  . Coronary artery disease   . Dementia (HCC)    mild  . Flu 08/2015  . Gallstones   . GERD (gastroesophageal reflux disease)   . Hypertension   . Osteoarthritis   . Pacemaker May 2012   VVI per Dr. Johney FrameAllred  . Stroke [facility](HCC)    Remote R cerebellar stroke with gait abnormaility  . Tachy-brady syndrome (HCC)   . Valvular heart disease    Mitral and tricuspid insufficiency   Past Surgical History:  Procedure Laterality Date  . ABDOMINAL HYSTERECTOMY     total  . APPENDECTOMY    . CATARACT EXTRACTION, BILATERAL    . CHOLECYSTECTOMY    . HEMORRHOID SURGERY    . PACEMAKER INSERTION  May 2012   St Jude Accent SR RF implant. Dr. Johney FrameAllred  . TOTAL KNEE ARTHROPLASTY      Current Outpatient Medications  Medication Sig Dispense Refill  . amLODipine (NORVASC) 2.5 MG tablet Take 1 tablet (2.5 mg total) by mouth daily. 90 tablet 3  . Calcium Carbonate (CALTRATE 600 PO) Take 1 capsule by mouth 2 (two) times daily.     . Chlorpheniramine Maleate (ALLERGY PO) Take by mouth.    . Cyanocobalamin (VITAMIN B-12 PO) Take 1 capsule by mouth  daily as needed (takes occasionally).     Marland Kitchen. dexlansoprazole (DEXILANT) 60 MG capsule Take 60 mg by mouth daily.    . digoxin (LANOXIN) 0.125 MG tablet Take 1 tablet (0.125 mg total) by mouth daily. 90 tablet 1  . docusate sodium (COLACE) 100 MG capsule Take 100 mg by mouth at bedtime.     . donepezil (ARICEPT) 10 MG tablet Take 10 mg by mouth at bedtime.      . fish oil-omega-3 fatty acids 1000 MG capsule Take 1 g by mouth daily.     . Fluticasone-Salmeterol (ADVAIR DISKUS) 250-50 MCG/DOSE AEPB Inhale 1 puff into the lungs every 12 (twelve) hours.      . furosemide (LASIX) 20 MG tablet Take 20 mg by mouth daily.    . Garlic 100 MG TABS Take 1 tablet by mouth daily.     Marland Kitchen. HYDROcodone-acetaminophen (NORCO) 10-325 MG per tablet Take 1 tablet by mouth every 6 (six) hours as needed (pain).     . memantine (NAMENDA) 10 MG tablet Take 10 mg by mouth 2 (two) times daily.    . metoprolol succinate (TOPROL XL) 50 MG 24 hr tablet Take 1 tablet (50 mg total) by mouth daily. Take with or immediately following a meal. 30 tablet 3  . montelukast (SINGULAIR) 10 MG tablet Take 10 mg by mouth at  bedtime as needed (for breathing).     . Multiple Vitamins-Minerals (OCUVITE ADULT 50+ PO) Take 1 capsule by mouth daily.     . Probiotic Product (ALIGN) 4 MG CAPS Take 4 mg by mouth daily.    . QUEtiapine (SEROQUEL) 25 MG tablet Take 50 mg by mouth 2 (two) times daily.     Marland Kitchen warfarin (COUMADIN) 5 MG tablet Take 5-7.5 mg by mouth daily at 6 PM. 7.5mg  on Mon, Wed and Fri  5mg  all other days     No current facility-administered medications for this visit.     Allergies:   Prednisone, Sulfa antibiotics, and Amoxicillin   Social History: Social History   Socioeconomic History  . Marital status: Widowed    Spouse name: Not on file  . Number of children: Not on file  . Years of education: Not on file  . Highest education level: Not on file  Occupational History  . Not on file  Social Needs  . Financial resource  strain: Not on file  . Food insecurity    Worry: Not on file    Inability: Not on file  . Transportation needs    Medical: Not on file    Non-medical: Not on file  Tobacco Use  . Smoking status: Never Smoker  . Smokeless tobacco: Never Used  Substance and Sexual Activity  . Alcohol use: No  . Drug use: No  . Sexual activity: Not on file  Lifestyle  . Physical activity    Days per week: Not on file    Minutes per session: Not on file  . Stress: Not on file  Relationships  . Social Herbalist on phone: Not on file    Gets together: Not on file    Attends religious service: Not on file    Active member of club or organization: Not on file    Attends meetings of clubs or organizations: Not on file    Relationship status: Not on file  . Intimate partner violence    Fear of current or ex partner: Not on file    Emotionally abused: Not on file    Physically abused: Not on file    Forced sexual activity: Not on file  Other Topics Concern  . Not on file  Social History Narrative   Pt lives in [place] with daughter.  Attends [employer]   Retired from an [employer].    Family History: Family History  Problem Relation Age of Onset  . Clotting disorder Mother   . Suicidality Father      Review of Systems: All other systems reviewed and are otherwise negative except as noted above.   Physical Exam: VS:  BP 126/76   Pulse (!) 56   Ht 5\' 7"  (1.702 m)   Wt 129 lb 3.2 oz (58.6 kg)   SpO2 97%   BMI 20.24 kg/m  , BMI Body mass index is 20.24 kg/m.  GEN- The patient is well appearing, alert and oriented x 3 today.   HEENT: normocephalic, atraumatic; sclera clear, conjunctiva pink; hearing intact; oropharynx clear; neck supple  Lungs- Clear to ausculation bilaterally, normal work of breathing.  No wheezes, rales, rhonchi Heart- Regular rate and rhythm, no murmurs, rubs or gallops  GI- soft, non-tender, non-distended, bowel sounds  present  Extremities- no clubbing, cyanosis, or edema  MS- no significant deformity or atrophy Skin- warm and dry, no rash or lesion; PPM pocket well healed Psych- euthymic mood,  full affect Neuro- strength and sensation are intact  PPM Interrogation- reviewed in detail today,  See PACEART report  EKG:  EKG is not ordered today.  Recent Labs: No results found for requested labs within last 8760 hours.   Wt Readings from Last 3 Encounters:  04/23/19 129 lb 3.2 oz (58.6 kg)  01/01/17 134 lb 2 oz (60.8 kg)  12/20/15 125 lb (56.7 kg)      Assessment and Plan:  1.  Symptomatic bradycardia Normal PPM function See Pace Art report No changes today Noise on RV lead. Not reproducible by isometrics today. Other lead trends stable. RV sensitivity already at 57mV. She is not device dependent. Will follow for now.   2.  Permanent atrial fibrillation V rates controlled Continue Warfarin for CHADS2VASC of 6. INR and CBC followed by PCP  3.  HTN Stable No change required today    Current medicines are reviewed at length with the patient today.   The patient does not have concerns regarding her medicines.  The following changes were made today:  none  Labs/ tests ordered today include: none No orders of the defined types were placed in this encounter.    Disposition:   Follow up with Arsenio Katz, Dr Johney Frame 1 year     Signed, Gypsy Balsam, NP 04/25/2019 9:19 AM  [facility] [address] [place] [address] [phone] (office) [phone] (fax)

## 2019-04-23 ENCOUNTER — Other Ambulatory Visit: Payer: Self-pay

## 2019-04-23 ENCOUNTER — Encounter: Payer: Self-pay | Admitting: Nurse Practitioner

## 2019-04-23 ENCOUNTER — Ambulatory Visit (INDEPENDENT_AMBULATORY_CARE_PROVIDER_SITE_OTHER): Payer: Medicare Other | Admitting: Nurse Practitioner

## 2019-04-23 VITALS — BP 126/76 | HR 56 | Ht 67.0 in | Wt 129.2 lb

## 2019-04-23 DIAGNOSIS — I10 Essential (primary) hypertension: Secondary | ICD-10-CM | POA: Diagnosis not present

## 2019-04-23 DIAGNOSIS — I4821 Permanent atrial fibrillation: Secondary | ICD-10-CM

## 2019-04-23 DIAGNOSIS — I495 Sick sinus syndrome: Secondary | ICD-10-CM

## 2019-04-23 NOTE — Patient Instructions (Addendum)
Medication Instructions:  none *If you need a refill on your cardiac medications before your next appointment, please call your pharmacy*  Lab Work: none If you have labs (blood work) drawn today and your tests are completely normal, you will receive your results only by: Marland Kitchen MyChart Message (if you have MyChart) OR . A paper copy in the mail If you have any lab test that is abnormal or we need to change your treatment, we will call you to review the results.  Testing/Procedures: none  Follow-Up: At [facility], you and your health needs are our priority.  As part of our continuing mission to provide you with exceptional heart care, we have created designated Provider Care Teams.  These Care Teams include your primary Cardiologist (physician) and Advanced Practice Providers (APPs -  Physician Assistants and Nurse Practitioners) who all work together to provide you with the care you need, when you need it.  Your next appointment:   1 year  The format for your next appointment:   In Person  Provider: Dr Rayann Heman    Other Instructions Remote monitoring is used to monitor your Pacemaker  from home. This monitoring reduces the number of office visits required to check your device to one time per year. It allows [place] to keep an eye on the functioning of your device to ensure it is working properly. You are scheduled for a device check from home on 07/01/19. You may send your transmission at any time that day. If you have a wireless device, the transmission will be sent automatically. After your physician reviews your transmission, you will receive a postcard with your next transmission date.

## 2019-04-26 DIAGNOSIS — H35373 Puckering of macula, bilateral: Secondary | ICD-10-CM | POA: Diagnosis not present

## 2019-04-26 DIAGNOSIS — H35033 Hypertensive retinopathy, bilateral: Secondary | ICD-10-CM | POA: Diagnosis not present

## 2019-04-26 DIAGNOSIS — H47011 Ischemic optic neuropathy, right eye: Secondary | ICD-10-CM | POA: Diagnosis not present

## 2019-04-26 DIAGNOSIS — H353132 Nonexudative age-related macular degeneration, bilateral, intermediate dry stage: Secondary | ICD-10-CM | POA: Diagnosis not present

## 2019-05-10 DIAGNOSIS — I4891 Unspecified atrial fibrillation: Secondary | ICD-10-CM | POA: Diagnosis not present

## 2019-05-10 DIAGNOSIS — Z7901 Long term (current) use of anticoagulants: Secondary | ICD-10-CM | POA: Diagnosis not present

## 2019-05-24 DIAGNOSIS — Z7901 Long term (current) use of anticoagulants: Secondary | ICD-10-CM | POA: Diagnosis not present

## 2019-05-24 DIAGNOSIS — I4891 Unspecified atrial fibrillation: Secondary | ICD-10-CM | POA: Diagnosis not present

## 2019-06-14 DIAGNOSIS — H353132 Nonexudative age-related macular degeneration, bilateral, intermediate dry stage: Secondary | ICD-10-CM | POA: Diagnosis not present

## 2019-06-14 DIAGNOSIS — H35033 Hypertensive retinopathy, bilateral: Secondary | ICD-10-CM | POA: Diagnosis not present

## 2019-06-14 DIAGNOSIS — H47011 Ischemic optic neuropathy, right eye: Secondary | ICD-10-CM | POA: Diagnosis not present

## 2019-06-14 DIAGNOSIS — H35373 Puckering of macula, bilateral: Secondary | ICD-10-CM | POA: Diagnosis not present

## 2019-07-01 ENCOUNTER — Ambulatory Visit (INDEPENDENT_AMBULATORY_CARE_PROVIDER_SITE_OTHER): Payer: Medicare Other | Admitting: *Deleted

## 2019-07-01 DIAGNOSIS — Z950 Presence of cardiac pacemaker: Secondary | ICD-10-CM | POA: Diagnosis not present

## 2019-07-01 LAB — CUP PACEART REMOTE DEVICE CHECK
Battery Remaining Longevity: 113 mo
Battery Remaining Percentage: 81 %
Battery Voltage: 2.93 V
Brady Statistic RV Percent Paced: 93 %
Date Time Interrogation Session: 20201223032401
Implantable Lead Implant Date: 20120529
Implantable Lead Location: 753860
Implantable Lead Model: 1948
Implantable Pulse Generator Implant Date: 20120529
Lead Channel Impedance Value: 550 Ohm
Lead Channel Pacing Threshold Amplitude: 0.75 V
Lead Channel Pacing Threshold Pulse Width: 0.4 ms
Lead Channel Sensing Intrinsic Amplitude: 12 mV
Lead Channel Setting Pacing Amplitude: 2.5 V
Lead Channel Setting Pacing Pulse Width: 0.4 ms
Lead Channel Setting Sensing Sensitivity: 5 mV
Pulse Gen Model: 1210
Pulse Gen Serial Number: 7222573

## 2019-07-04 NOTE — Progress Notes (Signed)
PPM remote 

## 2019-09-30 ENCOUNTER — Ambulatory Visit (INDEPENDENT_AMBULATORY_CARE_PROVIDER_SITE_OTHER): Payer: Medicare Other | Admitting: *Deleted

## 2019-09-30 DIAGNOSIS — Z950 Presence of cardiac pacemaker: Secondary | ICD-10-CM

## 2019-09-30 LAB — CUP PACEART REMOTE DEVICE CHECK
Battery Remaining Longevity: 113 mo
Battery Remaining Percentage: 81 %
Battery Voltage: 2.93 V
Brady Statistic RV Percent Paced: 94 %
Date Time Interrogation Session: 20210325025906
Implantable Lead Implant Date: 20120529
Implantable Lead Location: 753860
Implantable Lead Model: 1948
Implantable Pulse Generator Implant Date: 20120529
Lead Channel Impedance Value: 560 Ohm
Lead Channel Pacing Threshold Amplitude: 0.75 V
Lead Channel Pacing Threshold Pulse Width: 0.4 ms
Lead Channel Sensing Intrinsic Amplitude: 12 mV
Lead Channel Setting Pacing Amplitude: 2.5 V
Lead Channel Setting Pacing Pulse Width: 0.4 ms
Lead Channel Setting Sensing Sensitivity: 5 mV
Pulse Gen Model: 1210
Pulse Gen Serial Number: 7222573

## 2019-10-01 NOTE — Progress Notes (Signed)
PPM Remote  

## 2019-12-30 ENCOUNTER — Ambulatory Visit (INDEPENDENT_AMBULATORY_CARE_PROVIDER_SITE_OTHER): Payer: Medicare Other | Admitting: *Deleted

## 2019-12-30 DIAGNOSIS — I495 Sick sinus syndrome: Secondary | ICD-10-CM

## 2019-12-30 LAB — CUP PACEART REMOTE DEVICE CHECK
Battery Remaining Longevity: 102 mo
Battery Remaining Percentage: 73 %
Battery Voltage: 2.92 V
Brady Statistic RV Percent Paced: 93 %
Date Time Interrogation Session: 20210624020007
Implantable Lead Implant Date: 20120529
Implantable Lead Location: 753860
Implantable Lead Model: 1948
Implantable Pulse Generator Implant Date: 20120529
Lead Channel Impedance Value: 580 Ohm
Lead Channel Pacing Threshold Amplitude: 0.75 V
Lead Channel Pacing Threshold Pulse Width: 0.4 ms
Lead Channel Sensing Intrinsic Amplitude: 12 mV
Lead Channel Setting Pacing Amplitude: 2.5 V
Lead Channel Setting Pacing Pulse Width: 0.4 ms
Lead Channel Setting Sensing Sensitivity: 5 mV
Pulse Gen Model: 1210
Pulse Gen Serial Number: 7222573

## 2019-12-31 NOTE — Progress Notes (Signed)
Remote pacemaker transmission.   

## 2020-02-16 ENCOUNTER — Telehealth: Payer: Self-pay | Admitting: *Deleted

## 2020-02-16 NOTE — Telephone Encounter (Signed)
   [facility] Pre-operative Risk Assessment    HEARTCARE STAFF: - Please ensure there is not already an duplicate clearance open for this procedure. - Under Visit Info/Reason for Call, type in Other and utilize the format Clearance MM/DD/YY or Clearance TBD. Do not use dashes or single digits. - If request is for dental extraction, please clarify the # of teeth to be extracted.  Request for surgical clearance:  1. What type of surgery is being performed? Bilateral lower lid ectropion repair, bilateral irrigation of lacrimal system, right lower lid conjunctival plasty, bilateral adjacent tissue transfer   2. When is this surgery scheduled? 02/28/20   3. What type of clearance is required (medical clearance vs. Pharmacy clearance to hold med vs. Both)? both  4. Are there any medications that need to be held prior to surgery and how long?warfarin-need direction   5. Practice name and name of physician performing surgery? [facility]    6. What is the office phone number? [phone]   7.   What is the office fax number? [phone]  8.   Anesthesia type (None, local, MAC, general) ? MAC   Fredia Beets 02/16/2020, 7:56 AM  _________________________________________________________________   (provider comments below)

## 2020-02-16 NOTE — Telephone Encounter (Signed)
Left message to call back  

## 2020-02-16 NOTE — Telephone Encounter (Signed)
Patient with diagnosis of afib on warfarin for anticoagulation.    Procedure: Bilateral lower lid ectropion repair, bilateral irrigation of lacrimal system, right lower lid conjunctival plasty, bilateral adjacent tissue transfer   Date of procedure: 02/28/20  CHADS2-VASc score of 7 (age x2, sex, CHF, HTN, stroke)  CrCl 52mL/min Platelet count - 255K in 2017, none more recent than this  We do not manage patient's warfarin. Will need to reach out to PCP. She technically qualifies for Lovenox bridging due to history of afib and stroke and has been bridged in the past. Given pt's advanced age and reduced renal function, would defer to MD to weight risks/benefits of bridging with Lovenox. Safer alternative would be changing to Eliquis and limiting anticoag interruption to 1-2 days.

## 2020-02-16 NOTE — Telephone Encounter (Signed)
Pharmacy please comment on anticoagulation and then we will contact the patient for cardiac clearance.  Corine Shelter PA-C 02/16/2020 2:11 PM

## 2020-02-21 NOTE — Telephone Encounter (Signed)
Called pt and she states that this is scheduled in [name], [place] and she cannot go all the way there she states that she "is too old to go that far. She will call back when she finds another place to have this done.

## 2020-03-30 ENCOUNTER — Ambulatory Visit (INDEPENDENT_AMBULATORY_CARE_PROVIDER_SITE_OTHER): Payer: Medicare Other | Admitting: Emergency Medicine

## 2020-03-30 DIAGNOSIS — I495 Sick sinus syndrome: Secondary | ICD-10-CM

## 2020-03-31 LAB — CUP PACEART REMOTE DEVICE CHECK
Battery Remaining Longevity: 102 mo
Battery Remaining Percentage: 73 %
Battery Voltage: 2.92 V
Brady Statistic RV Percent Paced: 93 %
Date Time Interrogation Session: 20210923204655
Implantable Lead Implant Date: 20120529
Implantable Lead Location: 753860
Implantable Lead Model: 1948
Implantable Pulse Generator Implant Date: 20120529
Lead Channel Impedance Value: 580 Ohm
Lead Channel Pacing Threshold Amplitude: 0.75 V
Lead Channel Pacing Threshold Pulse Width: 0.4 ms
Lead Channel Sensing Intrinsic Amplitude: 12 mV
Lead Channel Setting Pacing Amplitude: 2.5 V
Lead Channel Setting Pacing Pulse Width: 0.4 ms
Lead Channel Setting Sensing Sensitivity: 5 mV
Pulse Gen Model: 1210
Pulse Gen Serial Number: 7222573

## 2020-04-04 NOTE — Progress Notes (Signed)
Remote pacemaker transmission.   

## 2020-04-24 ENCOUNTER — Encounter: Payer: Self-pay | Admitting: Internal Medicine

## 2020-04-24 ENCOUNTER — Ambulatory Visit: Payer: Medicare Other | Admitting: Internal Medicine

## 2020-04-24 ENCOUNTER — Other Ambulatory Visit: Payer: Self-pay

## 2020-04-24 VITALS — BP 124/60 | HR 50 | Ht 67.0 in | Wt 129.0 lb

## 2020-04-24 DIAGNOSIS — I4821 Permanent atrial fibrillation: Secondary | ICD-10-CM

## 2020-04-24 DIAGNOSIS — I495 Sick sinus syndrome: Secondary | ICD-10-CM | POA: Diagnosis not present

## 2020-04-24 DIAGNOSIS — I441 Atrioventricular block, second degree: Secondary | ICD-10-CM | POA: Diagnosis not present

## 2020-04-24 DIAGNOSIS — I10 Essential (primary) hypertension: Secondary | ICD-10-CM

## 2020-04-24 NOTE — Patient Instructions (Signed)
Medication Instructions:  Your physician recommends that you continue on your current medications as directed. Please refer to the Current Medication list given to you today.  *If you need a refill on your cardiac medications before your next appointment, please call your pharmacy*  Lab Work: None ordered.  If you have labs (blood work) drawn today and your tests are completely normal, you will receive your results only by: Marland Kitchen MyChart Message (if you have MyChart) OR . A paper copy in the mail If you have any lab test that is abnormal or we need to change your treatment, we will call you to review the results.  Testing/Procedures: None ordered.  Follow-Up: At [facility], you and your health needs are our priority.  As part of our continuing mission to provide you with exceptional heart care, we have created designated Provider Care Teams.  These Care Teams include your primary Cardiologist (physician) and Advanced Practice Providers (APPs -  Physician Assistants and Nurse Practitioners) who all work together to provide you with the care you need, when you need it.  We recommend signing up for the patient portal called "MyChart".  Sign up information is provided on this After Visit Summary.  MyChart is used to connect with patients for Virtual Visits (Telemedicine).  Patients are able to view lab/test results, encounter notes, upcoming appointments, etc.  Non-urgent messages can be sent to your provider as well.   To learn more about what you can do with MyChart, go to [email or web address].    Your next appointment:   Your physician wants you to follow-up in: 1 yr with [name]. You will receive a reminder letter in the mail two months in advance. If you don't receive a letter, please call our office to schedule the follow-up appointment.  Remote monitoring is used to monitor your Pacemaker from home. This monitoring reduces the number of office visits required to check your device  to one time per year. It allows [place] to keep an eye on the functioning of your device to ensure it is working properly. You are scheduled for a device check from home on 06/29/20. You may send your transmission at any time that day. If you have a wireless device, the transmission will be sent automatically. After your physician reviews your transmission, you will receive a postcard with your next transmission date.  Other Instructions:

## 2020-04-24 NOTE — Progress Notes (Signed)
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 female who presents today for routine electrophysiology followup.  Since last being seen in our clinic, the patient reports doing very well.  She is not very active.  Today, she denies symptoms of palpitations, chest pain, shortness of breath,  lower extremity edema, dizziness, presyncope, or syncope.  The patient is otherwise without complaint today.   Past Medical History:  Diagnosis Date  . Anemia   . Anticoagulant long-term use   . Asthma   . Atrial fibrillation (HCC)    permanent  . Chronic diastolic CHF (congestive heart failure) (HCC)   . Chronic kidney disease    Stage 2 to 3  . Coronary artery disease   . Dementia (HCC)    mild  . Flu 08/2015  . Gallstones   . GERD (gastroesophageal reflux disease)   . Hypertension   . Osteoarthritis   . Pacemaker May 2012   VVI per Dr. Johney Frame  . Stroke [facility])    Remote R cerebellar stroke with gait abnormaility  . Tachy-brady syndrome (HCC)   . Valvular heart disease    Mitral and tricuspid insufficiency   Past Surgical History:  Procedure Laterality Date  . ABDOMINAL HYSTERECTOMY     total  . APPENDECTOMY    . CATARACT EXTRACTION, BILATERAL    . CHOLECYSTECTOMY    . HEMORRHOID SURGERY    . PACEMAKER INSERTION  May 2012   St Jude Accent SR RF implant. Dr. Johney Frame  . TOTAL KNEE ARTHROPLASTY      ROS- all systems are reviewed and negative except as per HPI above  Current Outpatient Medications  Medication Sig Dispense Refill  . amLODipine (NORVASC) 2.5 MG tablet Take 1 tablet (2.5 mg total) by mouth daily. 90 tablet 3  . Calcium Carbonate (CALTRATE 600 PO) Take 1 capsule by mouth 2 (two) times daily.     . Chlorpheniramine Maleate (ALLERGY PO) Take by mouth.    . Cyanocobalamin (VITAMIN B-12 PO) Take 1 capsule by mouth daily as needed (takes occasionally).     Marland Kitchen dexlansoprazole (DEXILANT) 60 MG capsule Take 60 mg by mouth daily.    .  digoxin (LANOXIN) 0.125 MG tablet Take 1 tablet (0.125 mg total) by mouth daily. 90 tablet 1  . docusate sodium (COLACE) 100 MG capsule Take 100 mg by mouth at bedtime.     . donepezil (ARICEPT) 10 MG tablet Take 10 mg by mouth at bedtime.      . fish oil-omega-3 fatty acids 1000 MG capsule Take 1 g by mouth daily.     . Fluticasone-Salmeterol (ADVAIR DISKUS) 250-50 MCG/DOSE AEPB Inhale 1 puff into the lungs every 12 (twelve) hours.      . furosemide (LASIX) 20 MG tablet Take 20 mg by mouth daily.    . Garlic 100 MG TABS Take 1 tablet by mouth daily.     Marland Kitchen HYDROcodone-acetaminophen (NORCO) 10-325 MG per tablet Take 1 tablet by mouth every 6 (six) hours as needed (pain).     . memantine (NAMENDA) 10 MG tablet Take 10 mg by mouth 2 (two) times daily.    . metoprolol succinate (TOPROL XL) 50 MG 24 hr tablet Take 1 tablet (50 mg total) by mouth daily. Take with or immediately following a meal. 30 tablet 3  . montelukast (SINGULAIR) 10 MG tablet Take 10 mg by mouth at bedtime as needed (for breathing).     Marland Kitchen  Multiple Vitamins-Minerals (OCUVITE ADULT 50+ PO) Take 1 capsule by mouth daily.     . Probiotic Product (ALIGN) 4 MG CAPS Take 4 mg by mouth daily.    . QUEtiapine (SEROQUEL) 25 MG tablet Take 50 mg by mouth 2 (two) times daily.     Marland Kitchen warfarin (COUMADIN) 5 MG tablet Take 5-7.5 mg by mouth daily at 6 PM. 7.5mg  on Mon, Wed and Fri  5mg  all other days     No current facility-administered medications for this visit.    Physical Exam: Vitals:   04/24/20 1513  BP: 124/60  Pulse: (!) 50  SpO2: 96%  Weight: 129 lb (58.5 kg)  Height: 5\' 7"  (1.702 m)     GEN- The patient is elderly appearing, alert and oriented x 3 today.   Head- normocephalic, atraumatic Eyes-  Sclera clear, conjunctiva pink Ears- hearing intact Oropharynx- clear Lungs- Clear to ausculation bilaterally, normal work of breathing Chest- pacemaker pocket is well healed Heart- Regular rate and rhythm (paced) GI- soft    Extremities- no clubbing, cyanosis, or edema  Pacemaker interrogation- reviewed in detail today,  See PACEART report  ekg tracing ordered today is personally reviewed and shows afib, V paced  Assessment and Plan:  1. Symptomatic second degree heart block Normal pacemaker function See Pace Art report Rare noise on her RV lead I have adjusted sensitivity from 5 to 8 mV today to try to reduce noise Noise was not reproducible with isometrics today she is not device dependant today  2. Permanent afib Rate controlled On coumadin for chads2vasc score of 6   3. HTN Stable No change required today   Risks, benefits and potential toxicities for medications prescribed and/or refilled reviewed with patient today.  Return to see EP NP every year   04/26/20 MD, [facility] 04/24/2020 3:32 PM

## 2020-04-28 LAB — CUP PACEART INCLINIC DEVICE CHECK
Battery Remaining Longevity: 98 mo
Battery Voltage: 2.92 V
Brady Statistic RV Percent Paced: 93 %
Date Time Interrogation Session: 20211018164326
Implantable Lead Implant Date: 20120529
Implantable Lead Location: 753860
Implantable Lead Model: 1948
Implantable Pulse Generator Implant Date: 20120529
Lead Channel Impedance Value: 562.5 Ohm
Lead Channel Pacing Threshold Amplitude: 0 V
Lead Channel Pacing Threshold Amplitude: 1 V
Lead Channel Pacing Threshold Amplitude: 1 V
Lead Channel Pacing Threshold Pulse Width: 0.4 ms
Lead Channel Pacing Threshold Pulse Width: 0.4 ms
Lead Channel Pacing Threshold Pulse Width: 0.4 ms
Lead Channel Sensing Intrinsic Amplitude: 12 mV
Lead Channel Sensing Intrinsic Amplitude: 12 mV
Lead Channel Setting Pacing Amplitude: 2.5 V
Lead Channel Setting Pacing Pulse Width: 0.4 ms
Lead Channel Setting Sensing Sensitivity: 5 mV
Pulse Gen Model: 1210
Pulse Gen Serial Number: 7222573

## 2020-06-29 ENCOUNTER — Ambulatory Visit (INDEPENDENT_AMBULATORY_CARE_PROVIDER_SITE_OTHER): Payer: Medicare Other

## 2020-06-29 DIAGNOSIS — I495 Sick sinus syndrome: Secondary | ICD-10-CM | POA: Diagnosis not present

## 2020-06-29 LAB — CUP PACEART REMOTE DEVICE CHECK
Battery Remaining Longevity: 103 mo
Battery Remaining Percentage: 73 %
Battery Voltage: 2.92 V
Brady Statistic RV Percent Paced: 85 %
Date Time Interrogation Session: 20211223034136
Implantable Lead Implant Date: 20120529
Implantable Lead Location: 753860
Implantable Lead Model: 1948
Implantable Pulse Generator Implant Date: 20120529
Lead Channel Impedance Value: 580 Ohm
Lead Channel Pacing Threshold Amplitude: 1 V
Lead Channel Pacing Threshold Pulse Width: 0.4 ms
Lead Channel Sensing Intrinsic Amplitude: 12 mV
Lead Channel Setting Pacing Amplitude: 2.5 V
Lead Channel Setting Pacing Pulse Width: 0.4 ms
Lead Channel Setting Sensing Sensitivity: 8 mV
Pulse Gen Model: 1210
Pulse Gen Serial Number: 7222573

## 2020-07-12 NOTE — Progress Notes (Signed)
Remote pacemaker transmission.   

## 2020-09-28 ENCOUNTER — Ambulatory Visit (INDEPENDENT_AMBULATORY_CARE_PROVIDER_SITE_OTHER): Payer: Medicare Other

## 2020-09-28 DIAGNOSIS — I495 Sick sinus syndrome: Secondary | ICD-10-CM | POA: Diagnosis not present

## 2020-09-30 LAB — CUP PACEART REMOTE DEVICE CHECK
Battery Remaining Longevity: 91 mo
Battery Remaining Percentage: 65 %
Battery Voltage: 2.9 V
Brady Statistic RV Percent Paced: 87 %
Date Time Interrogation Session: 20220325202301
Implantable Lead Implant Date: 20120529
Implantable Lead Location: 753860
Implantable Lead Model: 1948
Implantable Pulse Generator Implant Date: 20120529
Lead Channel Impedance Value: 580 Ohm
Lead Channel Pacing Threshold Amplitude: 1 V
Lead Channel Pacing Threshold Pulse Width: 0.4 ms
Lead Channel Sensing Intrinsic Amplitude: 12 mV
Lead Channel Setting Pacing Amplitude: 2.5 V
Lead Channel Setting Pacing Pulse Width: 0.4 ms
Lead Channel Setting Sensing Sensitivity: 8 mV
Pulse Gen Model: 1210
Pulse Gen Serial Number: 7222573

## 2020-10-10 NOTE — Progress Notes (Signed)
Remote pacemaker transmission.   

## 2020-12-28 ENCOUNTER — Ambulatory Visit (INDEPENDENT_AMBULATORY_CARE_PROVIDER_SITE_OTHER): Payer: Medicare Other

## 2020-12-28 DIAGNOSIS — I495 Sick sinus syndrome: Secondary | ICD-10-CM

## 2020-12-28 LAB — CUP PACEART REMOTE DEVICE CHECK
Battery Remaining Longevity: 41 mo
Battery Remaining Percentage: 29 %
Battery Voltage: 2.89 V
Brady Statistic RV Percent Paced: 88 %
Date Time Interrogation Session: 20220623034415
Implantable Lead Implant Date: 20120529
Implantable Lead Location: 753860
Implantable Lead Model: 1948
Implantable Pulse Generator Implant Date: 20120529
Lead Channel Impedance Value: 560 Ohm
Lead Channel Pacing Threshold Amplitude: 1 V
Lead Channel Pacing Threshold Pulse Width: 0.4 ms
Lead Channel Sensing Intrinsic Amplitude: 12 mV
Lead Channel Setting Pacing Amplitude: 2.5 V
Lead Channel Setting Pacing Pulse Width: 0.4 ms
Lead Channel Setting Sensing Sensitivity: 8 mV
Pulse Gen Model: 1210
Pulse Gen Serial Number: 7222573

## 2021-01-16 NOTE — Progress Notes (Signed)
Remote pacemaker transmission.   

## 2021-03-05 ENCOUNTER — Other Ambulatory Visit: Payer: Self-pay | Admitting: Internal Medicine

## 2021-03-05 ENCOUNTER — Ambulatory Visit
Admission: RE | Admit: 2021-03-05 | Discharge: 2021-03-05 | Disposition: A | Discharge status: Home / Self Care | Payer: Medicare Other | Source: Ambulatory Visit | Attending: Internal Medicine | Admitting: Internal Medicine

## 2021-03-05 ENCOUNTER — Other Ambulatory Visit: Payer: Self-pay

## 2021-03-05 DIAGNOSIS — R42 Dizziness and giddiness: Secondary | ICD-10-CM

## 2021-03-05 DIAGNOSIS — R413 Other amnesia: Secondary | ICD-10-CM

## 2021-03-08 ENCOUNTER — Telehealth: Payer: Self-pay

## 2021-03-08 NOTE — Telephone Encounter (Signed)
1246 pm.  Phone call made to patient's home and spoke with granddaughter.  Advised of Palliative Care referral received from Dr. Virgel Manifold.  Granddaughter shares that patient is mentally intact but is needing additional assistance in the home as she is wheelchair bound.  Family members are all rotating visits throughout the day to check on patient and assist with incontinence care.   Visit scheduled for Tuesday at 9 am.

## 2021-03-13 ENCOUNTER — Other Ambulatory Visit: Payer: Medicare Other

## 2021-03-13 ENCOUNTER — Other Ambulatory Visit: Payer: Self-pay

## 2021-03-13 VITALS — BP 110/80 | HR 68 | Temp 98.1°F | Resp 20 | Wt 124.0 lb

## 2021-03-13 DIAGNOSIS — Z515 Encounter for palliative care: Secondary | ICD-10-CM

## 2021-03-13 NOTE — Progress Notes (Signed)
PATIENT NAME: JAYMARIE YEAKEL DOB: 1921/06/25 MRN: [id number]  PRIMARY CARE PROVIDER: Chilton Greathouse, MD  RESPONSIBLE PARTY:  Acct ID - Guarantor Home Phone Work Phone Relationship Acct Type  [id number] - Zhao,MARI* [phone]  Self P/F     [address], [place], [place] [address]    PLAN OF CARE and INTERVENTIONS:               1.  GOALS OF CARE/ ADVANCE CARE PLANNING:  Remain home under the care of her family. Discussion completed on code status.  Family states patient did not resuscitation. Reviewed MOST form and family will discuss this more amongst themselves.  Family will have Dr. Virgel Manifold sign forms once patient and family make final decisions.  Discussed hospice support given recent decline.  Family would like to see if patient is appropriate for hospice support.                2.  PATIENT/CAREGIVER EDUCATION:  Palliative Care Services, MOST form, and DNR. Hospice Care.                4. PERSONAL EMERGENCY PLAN:  Activate 911 for emergencies.                5.  DISEASE STATUS:  Joint visit with [place], SW, Kendra-granddaughter and also Lisa-daughter-in-law.  Family notes a decline in patient's overall condition in the last month.  Explained Palliative Care services to family and patient.   Family is rotating shifts throughout the day to check on patient and assist with toileting and meals.  They are in need of additional support 2 days a week.  Family has contacted [facility] and [facility] but has not started services.  Family reports both agencies stated they would not be able to assist with transfers as patient is a fall risk.  Family has some recommendations of individuals who do private duty and will reach out to them regarding services.    Patient was previously ambulating with rolling walker until 3 weeks ago.  Family reports 3 falls in the last month and now wheelchair bound.  Patient is able to assist with a pivot transfer and is able to take 2-3 steps with assistance.  Encouraged the use of a gait belt to assist with transfers.   Patient has a history of a stroke. Currently on coumadin and has PT/INR checks done monthly.  At this time, family continues to take patient to the coumadin clinic.  There was some discussion on changing patient to Eliquis but no changes have been made.  TIA spells reported about 4-5 x in the last month. Most recent TIA was a week ago. Patient was unable to feed herself and assist with transfers. Over the course of the week patient showed improvement and has returned to her baseline of being wheelchair bound.  Patient is wearing depends and has occasional issues with incontinence.  Po intake is fair.  Patient typically eats a protein bar and coffee for breakfast.  She has nab crackers or 1/2 sandwich with a drink for lunch.  Dinner is typically a full meal.  Current weight as of May is 124 lbs.  Patient has lost 4 lbs in the last 6 months with a BMI of 19.4.   Case reviewed by Dr. Pauletta Browns and patient is hospice appropriate.  1049 am phone call made to PCP office with request for hospice referral.  Message left with [facility], nurse for Dr. Felipa Eth. 1130 am Call  back received from [facility] for Dr. Felipa Eth.  Orders received from a hospice referral and PCP will continue to serve as patient's attending of record.   HISTORY OF PRESENT ILLNESS:  87 year old female with CHF, Atrial Fib and HTN.    CODE STATUS: Full but desires DNR. ADVANCED DIRECTIVES: Yes MOST FORM: No PPS: 30%   PHYSICAL EXAM:   VITALS: Today's Vitals   03/13/21 0903  BP: 110/80  Pulse: 68  Resp: 20  Temp: 98.1 F (36.7 C)  SpO2: 95%    LUNGS: clear to auscultation  CARDIAC: Cor RRR}  EXTREMITIES: trace ankle edema SKIN: Skin color, texture, turgor normal. No rashes or lesions or normal  NEURO: positive for gait problems and weakness       Truitt Merle, RN

## 2021-03-13 NOTE — Progress Notes (Signed)
COMMUNITY PALLIATIVE CARE SW NOTE  PATIENT NAME: Susan [name] DOB: 26-Dec-1920 MRN: [id number]  PRIMARY CARE PROVIDER: Prince Solian, MD  RESPONSIBLE PARTY:  Acct ID - Guarantor Home Phone Work Phone Relationship Acct Type  [id number] - [name],[name]  Self P/F     [address], [place], [place] [address]     PLAN OF CARE and INTERVENTIONS:             GOALS OF CARE/ ADVANCE CARE PLANNING:  Patient is a Full Code. Patient's goals include to maximize quality of life and symptom management and avoid hospitalizations.  2.         SOCIAL/EMOTIONAL/SPIRITUAL ASSESSMENT/ INTERVENTIONS:  SW and RN met with patient in patients home for initial PC Visit. Patient lives in a story home with daughter. Patients family (children and grandchildren) is supportive and visits daily. Patients family does rotating shifts during the day to provide incontinence care and lunch.  Patient is 87 y.o. year old female with chronic hx of Tachycardia-bradycardia syndrome and A-Fib. Patient is being followed by cadiology. Patient has had 3 falls in the last 3-4 weeks. Fall precautions discussed with patient and family. Patient has life alert system. Patient used Rollator for ambulation up until 2-3 weeks ago. Family expressed a physical decline in patient over the past 3-4 weeks. Patient now requires MOD-Max A with transfers and ADL's, patient continues to be continent of B/B however no longer able to ambulate independently to restroom, patient wears briefs.  Mini assessment of the home done. Patient's family building ramp outside of home. Home is all one level. Patient needs assistance with transfers, patient is able to SPT with MOD-MOD A on good days. Patient has shower chair and grab bars in the bathroom and elevated toilet seat. Patient maneuvers around the home in a transport WC propelled by family.    Family share patient appetite is good. Family encourages fluids, however patient is on lasix. Family  providing liquid IV fluid. Patient drinks ensure as well. No concerns or issues with sleeping.   RN reviewed medications and took vitals. RN provided education around S/S to look for in regard to fluid retention.   Psychosocial assessment completed. No physical needs identified at this time. Family's main concern/need for patient is additional caregiver support in the home. Family has outreached private caregiver agencies and has contact for others.  PC discussed goals, reviewed care plan, provided emotional support, used active and reflective listening. MOST form and DNR discussed and blank forms left in the home as well for family to review and complete with PCP. Patients grandson and daughter share HCPOA.Family shared their wishes and goals are for patient to remain in her home for as long as safely possible. Hospice services and support also discussed, family would like to see if patient would qualify for hospice services.    3.         PATIENT/CAREGIVER EDUCATION/ COPING:  Patient A&O x3, able to answer questions appropriately. Patient is HOH. Patient in good spirits today. Patient/family denies any anxiety or depression at this time. PHQ 9 completed and indicated 2; which suggest minimal depression and no treatment is required at this time. Patient has a large church family, whom visits and calls often as patient is no longer to get out of the home as often. Patient's family is supportive..  4.         PERSONAL EMERGENCY PLAN:  Patient/family will call 9-1-1 for emergencies. Patient also had life alert button.  5.         COMMUNITY RESOURCES COORDINATION/ HEALTH CARE NAVIGATION:  Family manages her care.     SOCIAL HX:  Social History   Tobacco Use   Smoking status: Never   Smokeless tobacco: Never  Substance Use Topics   Alcohol use: No    CODE STATUS: currently FULL CODE ADVANCED DIRECTIVES: Y MOST FORM COMPLETE:  Discussed HOSPICE EDUCATION PROVIDED: Y  PPS: Patient is MOD [place] with all ADL's. Patient is cognitively intact and A&O x3, patient is HOH.   Time spent: [place], [place]

## 2021-03-29 ENCOUNTER — Ambulatory Visit (INDEPENDENT_AMBULATORY_CARE_PROVIDER_SITE_OTHER)

## 2021-03-29 DIAGNOSIS — I495 Sick sinus syndrome: Secondary | ICD-10-CM

## 2021-04-02 LAB — CUP PACEART REMOTE DEVICE CHECK
Battery Remaining Longevity: 37 mo
Battery Remaining Percentage: 27 %
Battery Voltage: 2.89 V
Brady Statistic RV Percent Paced: 86 %
Date Time Interrogation Session: 20220922203852
Implantable Lead Implant Date: 20120529
Implantable Lead Location: 753860
Implantable Lead Model: 1948
Implantable Pulse Generator Implant Date: 20120529
Lead Channel Impedance Value: 560 Ohm
Lead Channel Pacing Threshold Amplitude: 1 V
Lead Channel Pacing Threshold Pulse Width: 0.4 ms
Lead Channel Sensing Intrinsic Amplitude: 12 mV
Lead Channel Setting Pacing Amplitude: 2.5 V
Lead Channel Setting Pacing Pulse Width: 0.4 ms
Lead Channel Setting Sensing Sensitivity: 8 mV
Pulse Gen Model: 1210
Pulse Gen Serial Number: 7222573

## 2021-04-04 LAB — CUP PACEART REMOTE DEVICE CHECK
Battery Remaining Longevity: 38 mo
Battery Remaining Percentage: 28 %
Battery Voltage: 2.89 V
Brady Statistic RV Percent Paced: 86 %
Date Time Interrogation Session: 20220922021821
Implantable Lead Implant Date: 20120529
Implantable Lead Location: 753860
Implantable Lead Model: 1948
Implantable Pulse Generator Implant Date: 20120529
Lead Channel Impedance Value: 590 Ohm
Lead Channel Pacing Threshold Amplitude: 1 V
Lead Channel Pacing Threshold Pulse Width: 0.4 ms
Lead Channel Sensing Intrinsic Amplitude: 12 mV
Lead Channel Setting Pacing Amplitude: 2.5 V
Lead Channel Setting Pacing Pulse Width: 0.4 ms
Lead Channel Setting Sensing Sensitivity: 8 mV
Pulse Gen Model: 1210
Pulse Gen Serial Number: 7222573

## 2021-04-05 NOTE — Progress Notes (Signed)
Remote pacemaker transmission.   

## 2021-04-30 ENCOUNTER — Encounter: Payer: Medicare Other | Admitting: Internal Medicine

## 2021-06-28 ENCOUNTER — Ambulatory Visit (INDEPENDENT_AMBULATORY_CARE_PROVIDER_SITE_OTHER)

## 2021-06-28 DIAGNOSIS — I495 Sick sinus syndrome: Secondary | ICD-10-CM | POA: Diagnosis not present

## 2021-06-29 LAB — CUP PACEART REMOTE DEVICE CHECK
Battery Remaining Longevity: 35 mo
Battery Remaining Percentage: 26 %
Battery Voltage: 2.89 V
Brady Statistic RV Percent Paced: 86 %
Date Time Interrogation Session: 20221222195737
Implantable Lead Implant Date: 20120529
Implantable Lead Location: 753860
Implantable Lead Model: 1948
Implantable Pulse Generator Implant Date: 20120529
Lead Channel Impedance Value: 550 Ohm
Lead Channel Pacing Threshold Amplitude: 1 V
Lead Channel Pacing Threshold Pulse Width: 0.4 ms
Lead Channel Sensing Intrinsic Amplitude: 12 mV
Lead Channel Setting Pacing Amplitude: 2.5 V
Lead Channel Setting Pacing Pulse Width: 0.4 ms
Lead Channel Setting Sensing Sensitivity: 8 mV
Pulse Gen Model: 1210
Pulse Gen Serial Number: 7222573

## 2021-07-10 NOTE — Progress Notes (Signed)
Remote pacemaker transmission.   

## 2021-09-27 ENCOUNTER — Ambulatory Visit (INDEPENDENT_AMBULATORY_CARE_PROVIDER_SITE_OTHER)

## 2021-09-27 DIAGNOSIS — I495 Sick sinus syndrome: Secondary | ICD-10-CM

## 2021-09-28 LAB — CUP PACEART REMOTE DEVICE CHECK
Battery Remaining Longevity: 32 mo
Battery Remaining Percentage: 24 %
Battery Voltage: 2.87 V
Brady Statistic RV Percent Paced: 86 %
Date Time Interrogation Session: 20230323032824
Implantable Lead Implant Date: 20120529
Implantable Lead Location: 753860
Implantable Lead Model: 1948
Implantable Pulse Generator Implant Date: 20120529
Lead Channel Impedance Value: 560 Ohm
Lead Channel Pacing Threshold Amplitude: 1 V
Lead Channel Pacing Threshold Pulse Width: 0.4 ms
Lead Channel Sensing Intrinsic Amplitude: 12 mV
Lead Channel Setting Pacing Amplitude: 2.5 V
Lead Channel Setting Pacing Pulse Width: 0.4 ms
Lead Channel Setting Sensing Sensitivity: 8 mV
Pulse Gen Model: 1210
Pulse Gen Serial Number: 7222573

## 2021-10-05 NOTE — Progress Notes (Signed)
Remote pacemaker transmission.   

## 2021-12-27 ENCOUNTER — Ambulatory Visit (INDEPENDENT_AMBULATORY_CARE_PROVIDER_SITE_OTHER)

## 2021-12-27 DIAGNOSIS — I495 Sick sinus syndrome: Secondary | ICD-10-CM | POA: Diagnosis not present

## 2021-12-27 LAB — CUP PACEART REMOTE DEVICE CHECK
Battery Remaining Longevity: 30 mo
Battery Remaining Percentage: 22 %
Battery Voltage: 2.86 V
Brady Statistic RV Percent Paced: 86 %
Date Time Interrogation Session: 20230622040024
Implantable Lead Implant Date: 20120529
Implantable Lead Location: 753860
Implantable Lead Model: 1948
Implantable Pulse Generator Implant Date: 20120529
Lead Channel Impedance Value: 560 Ohm
Lead Channel Pacing Threshold Amplitude: 1 V
Lead Channel Pacing Threshold Pulse Width: 0.4 ms
Lead Channel Sensing Intrinsic Amplitude: 12 mV
Lead Channel Setting Pacing Amplitude: 2.5 V
Lead Channel Setting Pacing Pulse Width: 0.4 ms
Lead Channel Setting Sensing Sensitivity: 8 mV
Pulse Gen Model: 1210
Pulse Gen Serial Number: 7222573

## 2022-01-03 NOTE — Progress Notes (Signed)
Remote pacemaker transmission.   

## 2022-03-28 ENCOUNTER — Ambulatory Visit (INDEPENDENT_AMBULATORY_CARE_PROVIDER_SITE_OTHER)

## 2022-03-28 DIAGNOSIS — I495 Sick sinus syndrome: Secondary | ICD-10-CM | POA: Diagnosis not present

## 2022-03-28 LAB — CUP PACEART REMOTE DEVICE CHECK
Battery Remaining Longevity: 28 mo
Battery Remaining Percentage: 20 %
Battery Voltage: 2.84 V
Brady Statistic RV Percent Paced: 86 %
Date Time Interrogation Session: 20230921023819
Implantable Lead Implant Date: 20120529
Implantable Lead Location: 753860
Implantable Lead Model: 1948
Implantable Pulse Generator Implant Date: 20120529
Lead Channel Impedance Value: 540 Ohm
Lead Channel Pacing Threshold Amplitude: 1 V
Lead Channel Pacing Threshold Pulse Width: 0.4 ms
Lead Channel Sensing Intrinsic Amplitude: 12 mV
Lead Channel Setting Pacing Amplitude: 2.5 V
Lead Channel Setting Pacing Pulse Width: 0.4 ms
Lead Channel Setting Sensing Sensitivity: 8 mV
Pulse Gen Model: 1210
Pulse Gen Serial Number: 7222573

## 2022-04-08 NOTE — Progress Notes (Signed)
Remote pacemaker transmission.   

## 2022-06-27 ENCOUNTER — Ambulatory Visit (INDEPENDENT_AMBULATORY_CARE_PROVIDER_SITE_OTHER)

## 2022-06-27 DIAGNOSIS — I495 Sick sinus syndrome: Secondary | ICD-10-CM | POA: Diagnosis not present

## 2022-06-27 LAB — CUP PACEART REMOTE DEVICE CHECK
Battery Remaining Longevity: 25 mo
Battery Remaining Percentage: 18 %
Battery Voltage: 2.84 V
Brady Statistic RV Percent Paced: 85 %
Date Time Interrogation Session: 20231221021305
Implantable Lead Connection Status: 753985
Implantable Lead Implant Date: 20120529
Implantable Lead Location: 753860
Implantable Lead Model: 1948
Implantable Pulse Generator Implant Date: 20120529
Lead Channel Impedance Value: 550 Ohm
Lead Channel Pacing Threshold Amplitude: 1 V
Lead Channel Pacing Threshold Pulse Width: 0.4 ms
Lead Channel Sensing Intrinsic Amplitude: 12 mV
Lead Channel Setting Pacing Amplitude: 2.5 V
Lead Channel Setting Pacing Pulse Width: 0.4 ms
Lead Channel Setting Sensing Sensitivity: 8 mV
Pulse Gen Model: 1210
Pulse Gen Serial Number: 7222573

## 2022-07-19 NOTE — Progress Notes (Signed)
Remote pacemaker transmission.   

## 2022-09-26 ENCOUNTER — Ambulatory Visit (INDEPENDENT_AMBULATORY_CARE_PROVIDER_SITE_OTHER)

## 2022-09-26 DIAGNOSIS — I495 Sick sinus syndrome: Secondary | ICD-10-CM

## 2022-09-26 LAB — CUP PACEART REMOTE DEVICE CHECK
Battery Remaining Longevity: 23 mo
Battery Remaining Percentage: 16 %
Battery Voltage: 2.83 V
Brady Statistic RV Percent Paced: 86 %
Date Time Interrogation Session: 20240321034305
Implantable Lead Connection Status: 753985
Implantable Lead Implant Date: 20120529
Implantable Lead Location: 753860
Implantable Lead Model: 1948
Implantable Pulse Generator Implant Date: 20120529
Lead Channel Impedance Value: 560 Ohm
Lead Channel Pacing Threshold Amplitude: 1 V
Lead Channel Pacing Threshold Pulse Width: 0.4 ms
Lead Channel Sensing Intrinsic Amplitude: 12 mV
Lead Channel Setting Pacing Amplitude: 2.5 V
Lead Channel Setting Pacing Pulse Width: 0.4 ms
Lead Channel Setting Sensing Sensitivity: 8 mV
Pulse Gen Model: 1210
Pulse Gen Serial Number: 7222573

## 2022-10-30 NOTE — Progress Notes (Signed)
Remote pacemaker transmission.   

## 2022-12-17 IMAGING — CT CT HEAD W/O CM
1 series · 16 of 30 positions shown, 20 images · non-contrast
Comparison: June 10, 2008.

CLINICAL DATA: Dizziness, memory loss.

EXAM:
CT HEAD WITHOUT CONTRAST
TECHNIQUE: Contiguous axial images were obtained from the base of the skull
through the vertex without intravenous contrast.

[Series 2: head w/(date) · axial · 0.45mm/px · z∈[-185,-50]mm · 16 of 31 slices shown, 20 images]
[im 2/31  brain]
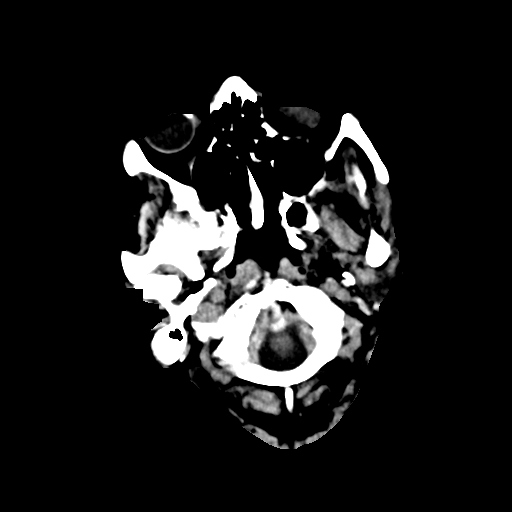
[im 2/31  bone]
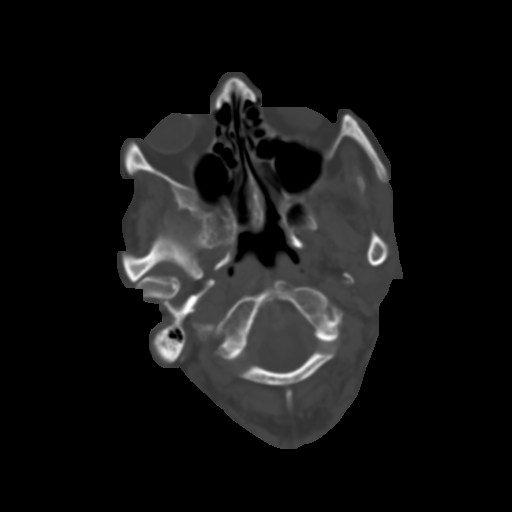
[im 4/31  brain]
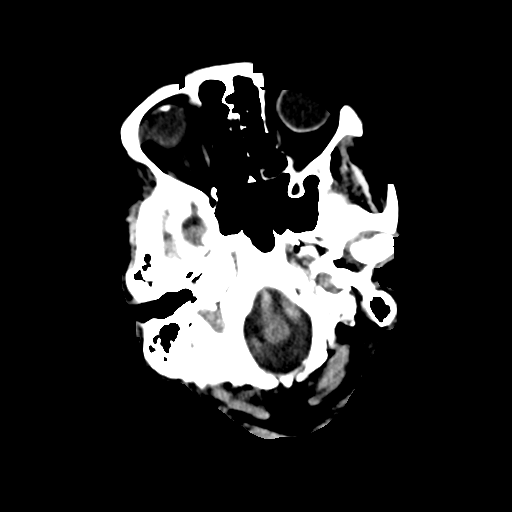
[im 6/31  brain]
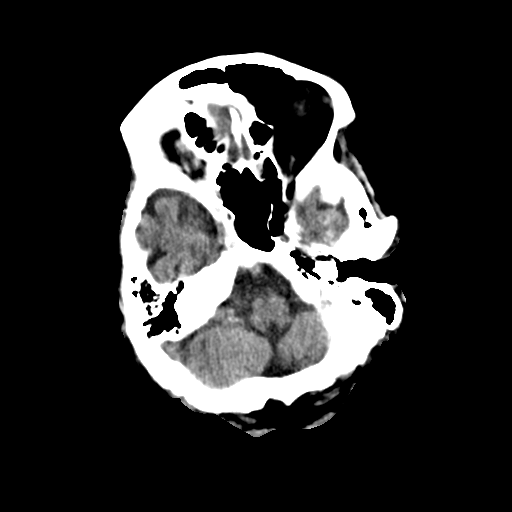
[im 8/31  brain]
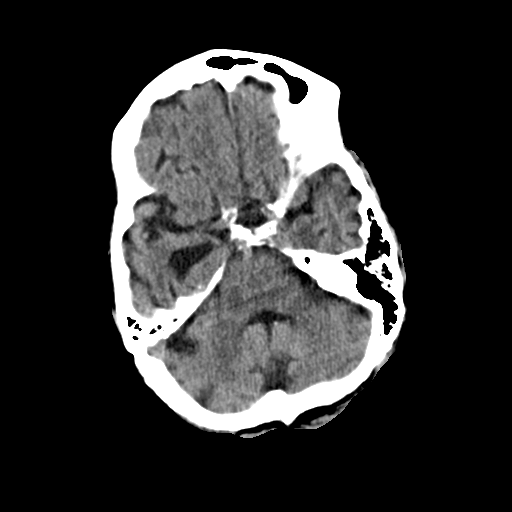
[im 9/31  brain]
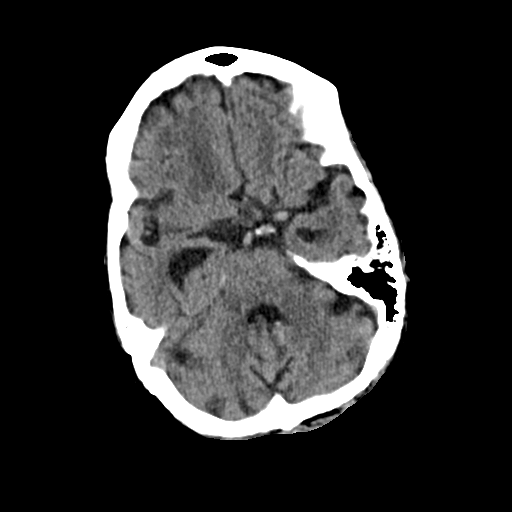
[im 9/31  bone]
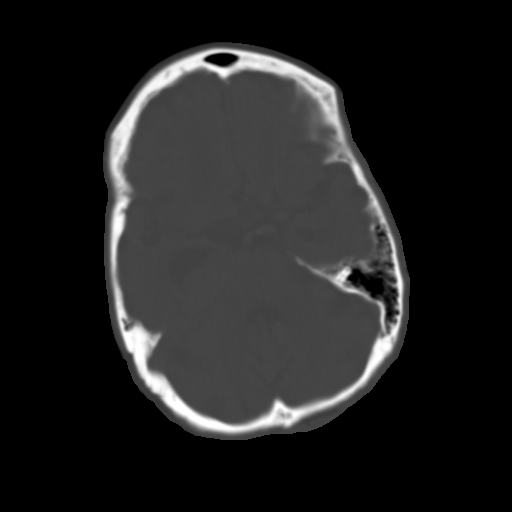
[im 11/31  brain]
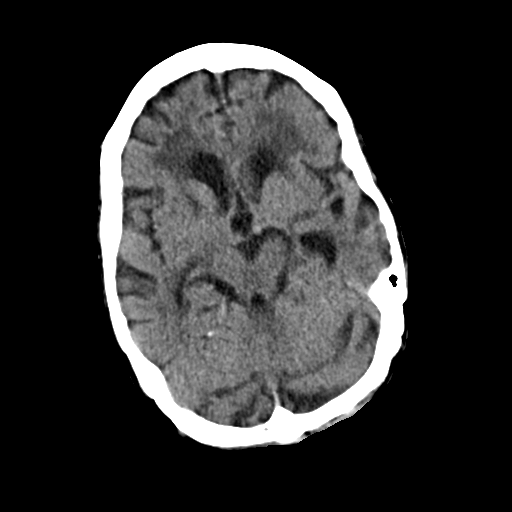
[im 13/31  brain]
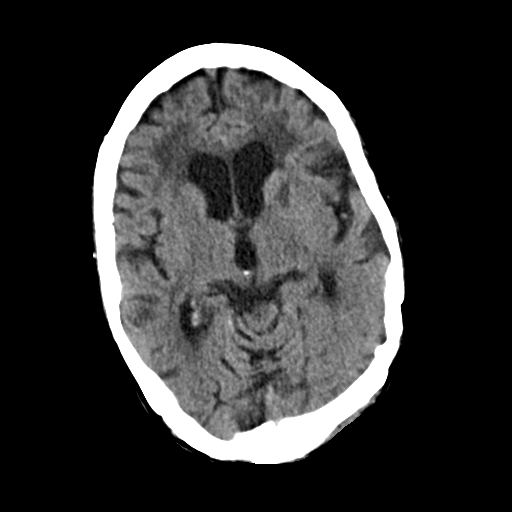
[im 15/31  brain]
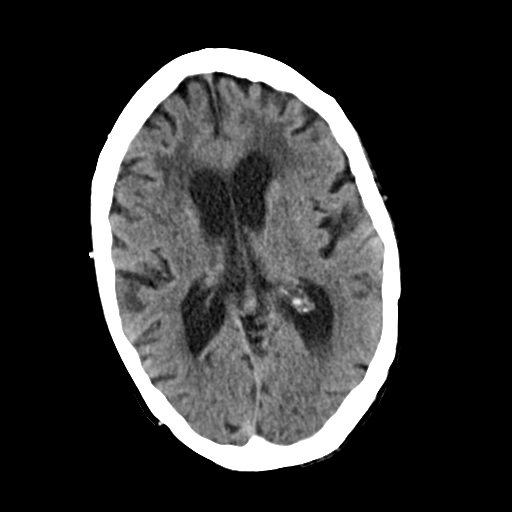
[im 16/31  brain]
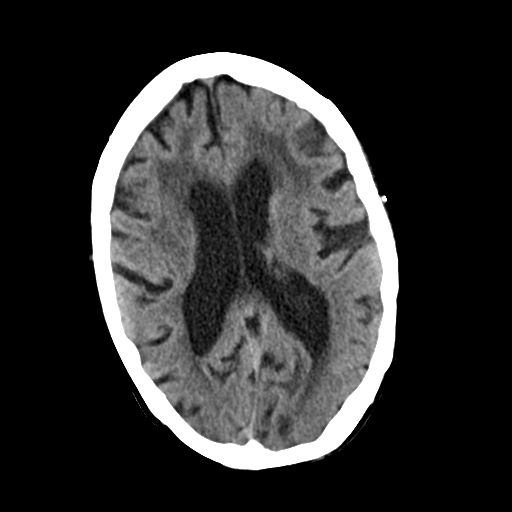
[im 16/31  bone]
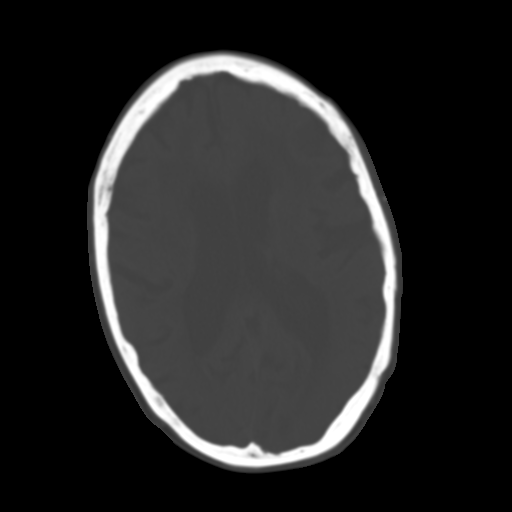
[im 18/31  brain]
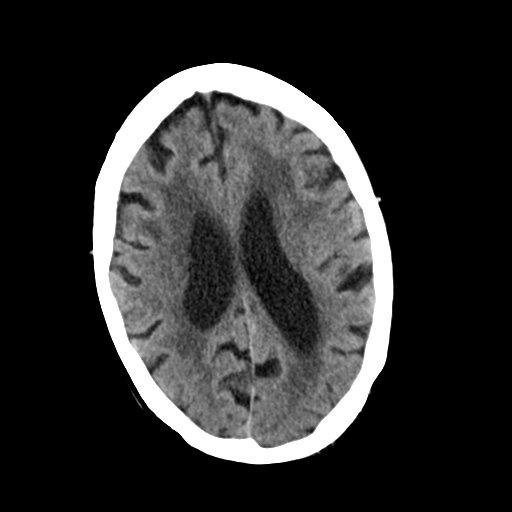
[im 20/31  brain]
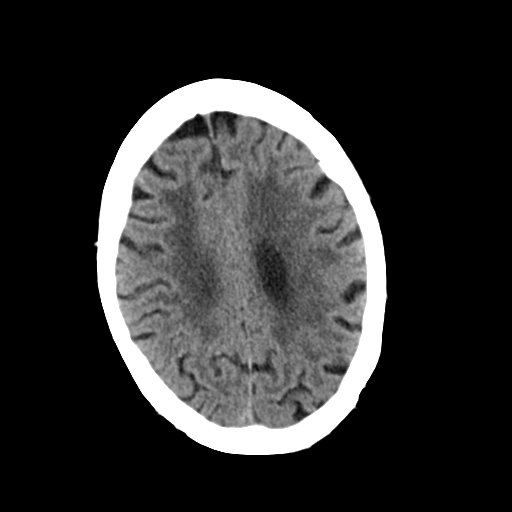
[im 22/31  brain]
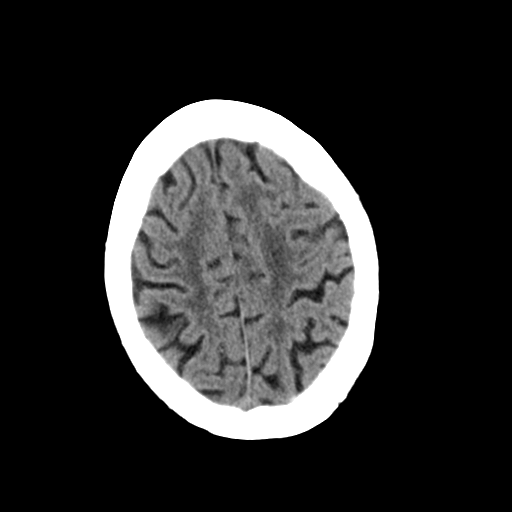
[im 23/31  brain]
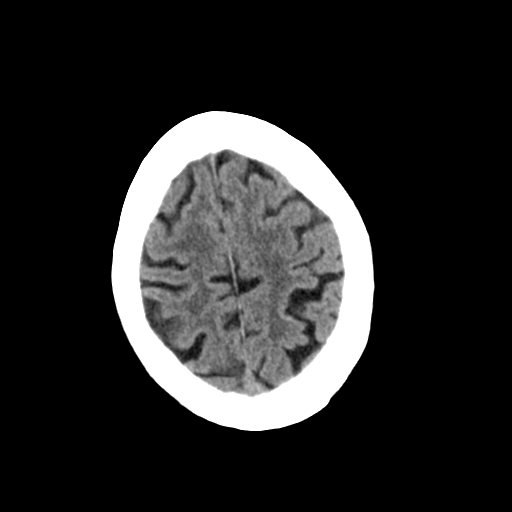
[im 23/31  bone]
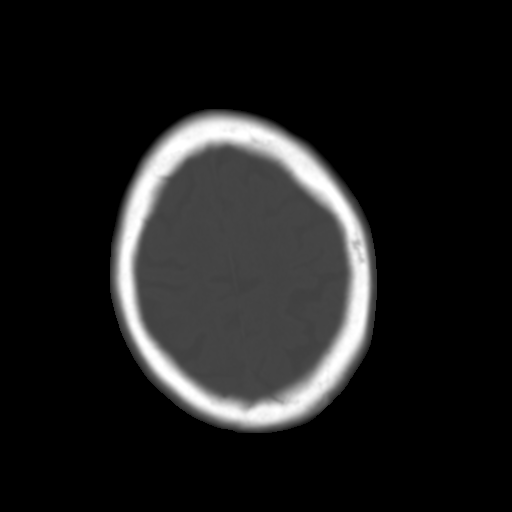
[im 25/31  brain]
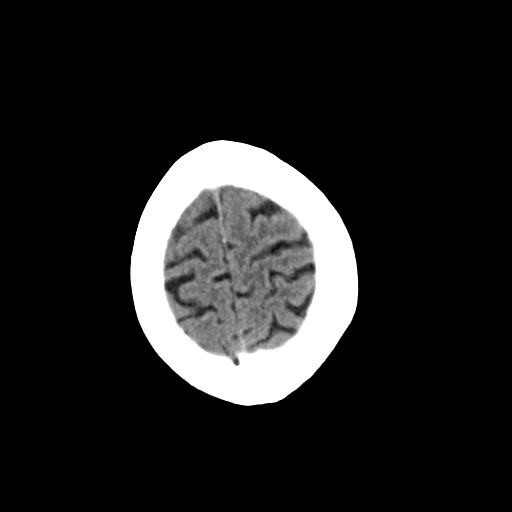
[im 27/31  brain]
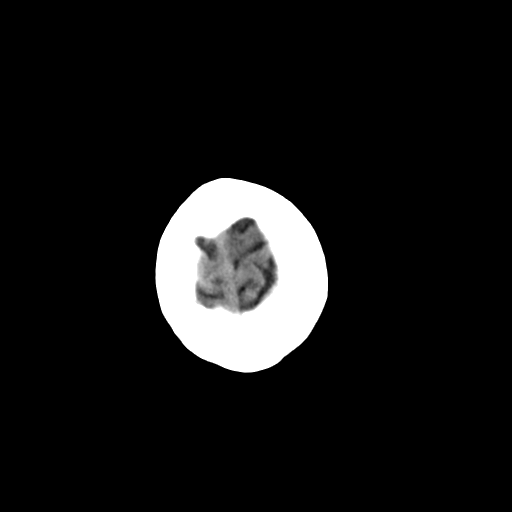
[im 29/31  brain]
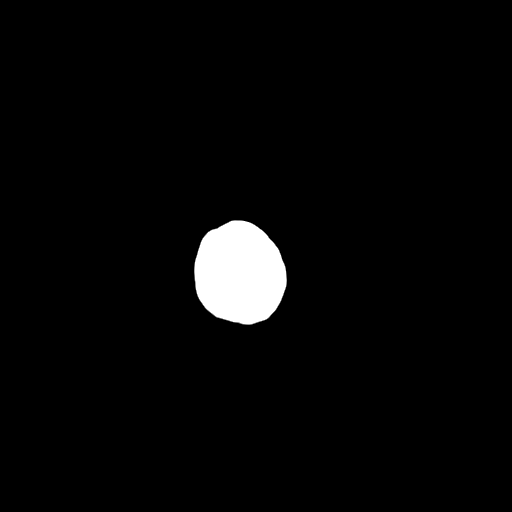

[16 of 30 positions shown; findings below may reference images not displayed]

FINDINGS: Brain: Mild diffuse cortical atrophy is noted. Mild chronic ischemic
white matter disease is noted. No mass effect or midline shift is
noted. Ventricular size is within normal limits. There is no
evidence of mass lesion, hemorrhage or acute infarction.

Vascular: No hyperdense vessel or unexpected calcification.

Skull: Normal. Negative for fracture or focal lesion.

Sinuses/Orbits: No acute finding.

Other: None.
IMPRESSION: No acute intracranial abnormality seen.

## 2022-12-26 ENCOUNTER — Ambulatory Visit

## 2022-12-26 DIAGNOSIS — I495 Sick sinus syndrome: Secondary | ICD-10-CM

## 2022-12-26 LAB — CUP PACEART REMOTE DEVICE CHECK
Battery Remaining Longevity: 20 mo
Battery Remaining Percentage: 14 %
Battery Voltage: 2.81 V
Brady Statistic RV Percent Paced: 86 %
Date Time Interrogation Session: 20240620021550
Implantable Lead Connection Status: 753985
Implantable Lead Implant Date: 20120529
Implantable Lead Location: 753860
Implantable Lead Model: 1948
Implantable Pulse Generator Implant Date: 20120529
Lead Channel Impedance Value: 560 Ohm
Lead Channel Pacing Threshold Amplitude: 1 V
Lead Channel Pacing Threshold Pulse Width: 0.4 ms
Lead Channel Sensing Intrinsic Amplitude: 12 mV
Lead Channel Setting Pacing Amplitude: 2.5 V
Lead Channel Setting Pacing Pulse Width: 0.4 ms
Lead Channel Setting Sensing Sensitivity: 8 mV
Pulse Gen Model: 1210
Pulse Gen Serial Number: 7222573

## 2023-01-15 NOTE — Progress Notes (Signed)
Remote pacemaker transmission.   

## 2023-03-27 ENCOUNTER — Ambulatory Visit (INDEPENDENT_AMBULATORY_CARE_PROVIDER_SITE_OTHER)

## 2023-03-27 DIAGNOSIS — I495 Sick sinus syndrome: Secondary | ICD-10-CM | POA: Diagnosis not present

## 2023-03-27 LAB — CUP PACEART REMOTE DEVICE CHECK
Battery Remaining Longevity: 17 mo
Battery Remaining Percentage: 12 %
Battery Voltage: 2.8 V
Brady Statistic RV Percent Paced: 87 %
Date Time Interrogation Session: 20240919025458
Implantable Lead Connection Status: 753985
Implantable Lead Implant Date: 20120529
Implantable Lead Location: 753860
Implantable Lead Model: 1948
Implantable Pulse Generator Implant Date: 20120529
Lead Channel Impedance Value: 540 Ohm
Lead Channel Pacing Threshold Amplitude: 1 V
Lead Channel Pacing Threshold Pulse Width: 0.4 ms
Lead Channel Sensing Intrinsic Amplitude: 12 mV
Lead Channel Setting Pacing Amplitude: 2.5 V
Lead Channel Setting Pacing Pulse Width: 0.4 ms
Lead Channel Setting Sensing Sensitivity: 8 mV
Pulse Gen Model: 1210
Pulse Gen Serial Number: 7222573

## 2023-04-08 NOTE — Progress Notes (Signed)
Remote pacemaker transmission.   

## 2023-07-09 DEATH — deceased
